# Patient Record
Sex: Female | Born: 1945 | Race: White | Hispanic: No | Marital: Married
Health system: Midwestern US, Academic
[De-identification: ages and names within clinical notes are randomized; demographics above are authoritative.]

## PROBLEM LIST (undated history)

## (undated) DIAGNOSIS — Z08 Encounter for follow-up examination after completed treatment for malignant neoplasm: ICD-10-CM

---

## 2016-06-23 MED ORDER — PEG-ELECTROLYTE SOLN 420 GRAM PO SOLR
4 L | Freq: Once | ORAL | 0 refills | Status: AC
Start: 2016-06-23 — End: ?

## 2016-07-07 MED ORDER — PEG-ELECTROLYTE SOLN 420 GRAM PO SOLR
4 L | Freq: Once | ORAL | 0 refills | Status: AC
Start: 2016-07-07 — End: ?

## 2016-08-14 ENCOUNTER — Encounter: Admit: 2016-08-14 | Discharge: 2016-08-14 | Payer: MEDICARE

## 2016-08-14 NOTE — Progress Notes
This encounter was created in error. Please disregard.

## 2016-08-16 ENCOUNTER — Encounter: Admit: 2016-08-16 | Discharge: 2016-08-16 | Payer: MEDICARE

## 2016-08-16 DIAGNOSIS — Z87891 Personal history of nicotine dependence: ICD-10-CM

## 2016-08-16 DIAGNOSIS — G35 Multiple sclerosis: ICD-10-CM

## 2016-08-16 DIAGNOSIS — Z9189 Other specified personal risk factors, not elsewhere classified: ICD-10-CM

## 2016-08-16 DIAGNOSIS — Z923 Personal history of irradiation: ICD-10-CM

## 2016-08-16 DIAGNOSIS — Z9221 Personal history of antineoplastic chemotherapy: ICD-10-CM

## 2016-08-16 DIAGNOSIS — Z780 Asymptomatic menopausal state: ICD-10-CM

## 2016-08-16 DIAGNOSIS — E785 Hyperlipidemia, unspecified: ICD-10-CM

## 2016-08-16 DIAGNOSIS — G629 Polyneuropathy, unspecified: ICD-10-CM

## 2016-08-16 DIAGNOSIS — K219 Gastro-esophageal reflux disease without esophagitis: ICD-10-CM

## 2016-08-16 DIAGNOSIS — I219 Acute myocardial infarction, unspecified: ICD-10-CM

## 2016-08-16 DIAGNOSIS — Z171 Estrogen receptor negative status [ER-]: ICD-10-CM

## 2016-08-16 DIAGNOSIS — I10 Essential (primary) hypertension: Principal | ICD-10-CM

## 2016-08-16 DIAGNOSIS — M549 Dorsalgia, unspecified: ICD-10-CM

## 2016-08-16 DIAGNOSIS — C801 Malignant (primary) neoplasm, unspecified: ICD-10-CM

## 2016-08-16 DIAGNOSIS — M1990 Unspecified osteoarthritis, unspecified site: ICD-10-CM

## 2016-08-16 DIAGNOSIS — Z9889 Other specified postprocedural states: ICD-10-CM

## 2016-08-16 DIAGNOSIS — Z08 Encounter for follow-up examination after completed treatment for malignant neoplasm: ICD-10-CM

## 2016-08-16 DIAGNOSIS — C50112 Malignant neoplasm of central portion of left female breast: Principal | ICD-10-CM

## 2016-08-16 NOTE — Progress Notes
LYMPHEDEMA DATASHEET-FOLLOW-UP  DATE:  08/16/16    PATIENT NAME:  Rachael Black  DATE OF BIRTH:  [birth year]  MRN:   [id number]    Follow-Up Visit:  8 months  Patient Completed Questionnaire?:  No  Lymphedema Diagnosis?:  No  LYMPHEDEMA INSTRUMENT:   PATIENT POPULATION:    Diagnosed with and treated for cancer at [facility]? Yes    Referred to [facility] after outside cancer treatment?  No    Referred to [facility] for lymphedema management?  No     DIAGNOSTIC DEFINITION OF LYMPHEDEMA:     Other:       IMPACT OF LE ON ADL:   CHANGES IN BMI (UPON LE DIAGNOSIS):  Stable    CHANGES IN ACTIVITY (CHECK ALL THAT APPLY):  Seed Localization and Segmental Mastectomy, SLNBX   RECENT INFECTIONS?:  No   TYPE OF INFECTION(S):  none   CHANGE IN PRESCRIBED MEDICATION?:  No   USE OF COMPRESSION SLEEVE:  No    FOLLOW-UP MEASUREMENTS:  Handedness:  right handed    Circumferential Measurements Perometer Measurements Bioimpedance Analysis   RUE/LUE:    Left (ml):  N/A 3.0 (-5.0)   Hand:  17.3/17.1   Right (ml):  N/A    Wrist:  15/15.1    Difference (ml):      8 cm:  19.5/17.8     16 cm:  22.2/21     Elbow cm:  22.5/22.5     8 cm:  23.6/24     16 cm:  26.7/27.2     24 cm:  31/31     BMI:28.2  Notes     Reviewed checklist with patient regarding any recent changes in health history, infections, medications, or general concerns as it relates to lymphedema.  Reinforced and verified adherence to precautions from initial visit.  No concerns or questions.  Discussed current activity level.  Has returned to previous activity level without concern.      Reviewed early signs and symptoms of lymphedema in affected arm.  None indicated.  Verified patient is aware of the early signs and symptoms to watch for as well as when to contact us.      Assessment:  L-dex: WNL    Measurements: no significant change (>2cm baseline)   ROM: WNL   Discussed assessment with patient.  No change in assessment to indicate presence of lymphedema. Lymphedema Stage:  Not applicable, no indication of lymphedema.     Recommendations:  Continue with precautions, meticulous skin care, weight management as well as active healthy lifestyle.  Verified no referrals indicated at this time for any issues or concerns.       Plan:  Return to clinic for routine visits based upon scheduled plan of [phone], then annual visits.  Verified patient has contact information should any questions or concerns arise.      Follow up appointment for lymphedema prevention clinic will be scheduled by patients breast surgeons team.

## 2016-08-16 NOTE — Progress Notes
Name: Ramira Ly          MRN: [id number]      DOB: 1945/05/19      AGE: 71 y.o.   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 Bures returns to the clinic today for continued surveillance. She is 1.5 years s/p left lumpectomy/SLNB following neoadjuvant chemotherapy for a triple negative, IDC. She denies any new findings on self-breast exam and she has no complaints today.       Cancer Staging  Malignant neoplasm of central portion of left female breast [facility])  Staging form: Breast, AJCC 7th Edition  - Clinical stage from 06/30/2015: Stage IA (T1c, N0, M0) - Signed by Judye Bos, PA-C on 06/30/2015  - Pathologic stage from 12/21/2015: yT0, N0, cM0 - Signed by Judye Bos, PA-C on 12/21/2015      History of Present Illness  HISTORY:  Ms. Yott is a Caucasian female who presented to the [facility] on 06/23/2015 at age 49 for evaluation of left breast lump. Ms. Coppes felt a lump in the middle of April 2017. She feels it is about the size of a quarter. She denies any nipple discharge, skin change or pain. She has not had imaging since 2015.  Ultrasound guided biopsy on 06/25/15 revealed grade 3 IDC.  Ms. Jarnagin completed neoadjuvant ddAC + Taxol on 11/30/15.  Ms. Linley underwent left RSL lumpectomy/SLNB on 12/14/2015.  Final pathology revealed a complete pathologic response with 0/4 lymph nodes.  She completed radiation therapy with Dr. Janee Morn on 02/09/16.      BREAST IMAGING:  Mammogram:    -- Bilateral screening mammogram 01/02/14 [facility]) revealed scattered fibroglandular densities. There was no suspicious mass or architectural distortion.  -- Bilateral diagnostic mammogram 06/23/15 ([place]) revealed scattered fibroglandular densities. There was a dense, irregular, 2 cm mass at the patient's area of palpable concern in the upper left breast, 12:00 position, anterior to middle depth. No new suspicious abnormality was seen within the right breast on mammogram.  Ultrasound:  -- Targeted left breast ultrasound 06/23/15 ([place]) revealed at 12:00, 5 cm from the nipple, demonstrated a 2.1 x 1.6 x 1.9 cm irregular, not parallel, hypoechoic mass with angular margins and internal blood flow. There may be intraductal extension from this mass towards the nipple. No suspicious left axillary or left parasternal lymph nodes were identified.  MRI:  -- Bilateral breast MRI 07/23/15 ([place]) revealed no area of suspicious enhancement in the right breast. No adenopathy, skin or nipple abnormalities were identified. Within the left breast superiorly at approximately 12:00 there was the known malignancy measuring 1.6 cm. 3 cm posterior and inferior to the mass within the middle depth central breast slightly medial, there was a 4 mm circumscribed enhancing mass which was stable on multiple prior mammograms dating back with June 2012. No skin or nipple abnormalities were present. No axillary abnormalities were present.    REPRODUCTIVE HEALTH:  Age at first Menarche:  38  Age at First Live Birth:  26  Age at Menopause:  37, took HRT x 2 years  Gravida:  2  Para: 2  Breastfeeding:  No    PROCEDURE:   1.  Right breast cyst excision, 1990s  2.  Left RSL lumpectomy/SLNB, 12/14/2015 [name])  PERTINENT PMH:  Multiple sclerosis, HTN, HLD, GERD   FAMILY HISTORY:  No family history of breast cancer  PHYSICAL EXAM on PRESENTATION:  Left - 2.5 cm lump at 12:00, 5 cm FTN. No skin or nipple changes. Right - No palpable breast masses. No skin, nipple, or areolar change. No supraclavicular or axillary adenopathy.    MEDICAL ONCOLOGY: Dr. Stasia Cavalier PRESENT THERAPY:  Neoadjuvant ddAC + Taxol 07/20/15 - 11/23/15.  REFERRED BY:  Dr. Merian Capron    TREATMENT HISTORY:   Dose dense AC 522/17 - 08/31/15  Weekly Taxol 09/14/15          Review of Systems Constitutional: Negative for fever, chills, appetite change and fatigue.   HENT: Negative for hearing loss, congestion, rhinorrhea and tinnitus.    Eyes: Negative for pain, discharge and itching.   Respiratory: Negative for cough, chest tightness and shortness of breath.    Cardiovascular: Negative for chest pain and palpitations.   Gastrointestinal: Negative for abdominal distention, pain, nausea, vomiting, and diarrhea.   Genitourinary: Negative for frequency, vaginal bleeding, difficulty urinating and pelvic pain.   Musculoskeletal: Negative for myalgias, back pain, joint swelling and arthralgias.   Skin: Negative for rash.   Neurological: Negative for dizziness, weakness, light-headedness and headaches.   Hematological: Does not bruise/bleed easily.   Psychiatric/Behavioral: Negative for disturbed wake/sleep cycle. The patient is not nervous/anxious.    Allergies   Allergen Reactions   ??? Sulfa (Sulfonamide Antibiotics) HIVES and SHORTNESS OF BREATH     The following medical/surgical/family/social history and the list of medications are current, as of 08/16/2016    Past Medical History:   Diagnosis Date   ??? Arthritis    ??? Back pain    ??? Cancer (HCC) 06/2015    Breast    ??? GERD (gastroesophageal reflux disease)    ??? History of chemotherapy     neoadjuvant chemotherapy 2017   ??? HLD (hyperlipidemia)    ??? Hypertension    ??? Multiple sclerosis (HCC) 1990   ??? Neuropathy      Past Surgical History:   Procedure Laterality Date   ??? HX RHINOPLASTY  1974   ??? HX TUBAL LIGATION  1976   ??? ANKLE SURGERY Right 2000    with instrumentation   ??? PR MASTECTOMY PARTIAL Left 12/14/2015    Left radioactive seed localized lumpectomy, left axillary sentinel lymph node biopsy  performed by Cordelia Poche, MD at [facility] OR/PERIOP   ??? COLONOSCOPY       Family History   Problem Relation Age of Onset   ??? Arthritis-osteo Mother    ??? Thyroid Disease Mother    ??? Heart Disease Father    ??? High Cholesterol Father    ??? Cancer Maternal Aunt ??? Cancer Maternal Uncle    ??? Cancer Paternal Uncle    ??? Cancer-Colon Maternal Grandmother    ??? Stroke Maternal Grandmother    ??? Cancer-Colon Paternal Grandmother      Social History     Social History   ??? Marital status: Married     Spouse name: N/A   ??? Number of children: N/A   ??? Years of education: N/A     Social History Main Topics   ??? Smoking status: Former Smoker     Packs/day: 1.00     Years: 10.00     Types: Cigarettes     Quit date: 02/28/2005   ??? Smokeless tobacco: Never Used   ??? Alcohol use 4.8 oz/week     8 Standard drinks  or equivalent per week   ??? Drug use: No   ??? Sexual activity: Not on file     Other Topics Concern   ??? Not on file     Social History Narrative   ??? No narrative on file     Objective:         ??? ALPHA LIPOIC ACID 100 mg cap Take 1 capsule by mouth daily.   ??? cholecalciferol (VITAMIN D-3) 1,000 units tablet Take 2,000 Units by mouth daily.   ??? gabapentin (NEURONTIN) 300 mg capsule Take 2 caps three times a day.  Indications: NEUROPATHIC PAIN   ??? loratadine (CLARITIN) 10 mg tablet Take 1 Tab by mouth every morning.   ??? pantoprazole DR (PROTONIX) 20 mg tablet Take 20 mg by mouth daily.   ??? pyridoxine (vitamin B6) (VITAMIN B-6) 100 mg tablet Take 1 tablet by mouth daily.     Vitals:    08/16/16 1126   BP: 117/86   Pulse: 74   Resp: 16   Temp: 36.9 ???C (98.5 ???F)   TempSrc: Oral   SpO2: 100%   Weight: 75 kg (165 lb 6.4 oz)   Height: 163 cm (64.17)     Body mass index is 28.24 kg/m???.     Pain Score: Zero         Pain Addressed:  N/A    Patient Evaluated for a Clinical Trial: No treatment clinical trial available for this patient.     [place] Cooperative Oncology Group performance status is 0, Fully active, able to carry on all pre-disease performance without restriction.Marland Kitchen     Physical Exam   Pulmonary/Chest:       Vitals reviewed.       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  HEENT:  Head: Normocephalic and atraumatic.  Eyes: No discharge. No scleral icterus.  Pulmonary/Chest: No respiratory distress.   Neurological: Alert and oriented to person, place and time. No cranial nerve deficit.  Skin: Warm and dry. No rash noted. No erythema. No pallor.  Psychiatric: Normal mood and affect. Behavior is normal. Judgement and thought content normal.         Assessment and Plan:  71 y/o female 1.5 years s/p left lumpectomy/SLNB following neoadjuvant chemotherapy for a grade 3, triple negative, IDC (ER 0%, PR 0%, HER2 1+, Ki-67 68%) at 12:00, dx 05/2015. ypCR.  ???  Ms. Caridi continues to do well.  There is no clinical evidence of recurrence. There have been no changes to her past medical or family history since her last visit. She continues to have a palpable seroma in the left axilla which she states is not bothersome to her and would prefer not ot have any additional aspirations of the area. She was seen in the lymphedema clinic today and will follow-up in the lymphedema clinic in 4 months. Ms. Grimley will continue to follow with Dr.  Satelli and Dr. Janee Morn. She is scheduled for bilateral diagnostic mammograms today; will call with results. Ms. Robenson was given ample time to have all of her questions answered and she was seen in conjunction with Dr. Veatrice Kells today.  ???  1.  Continue follow-up with Dr. Stasia Cavalier  2. Continue follow-up with Dr. Janee Morn  3. Bilateral diagnostic mammograms are scheduled for today; will call with results 4. RTC in 4 months to see APP; will coordinate follow-up in lymphedema clinic at that time.    Carylon Perches, APRN

## 2016-09-26 ENCOUNTER — Encounter: Admit: 2016-09-26 | Discharge: 2016-09-27 | Payer: MEDICARE

## 2016-09-28 ENCOUNTER — Ambulatory Visit: Admit: 2016-09-28 | Discharge: 2016-10-12 | Payer: MEDICARE

## 2016-10-03 ENCOUNTER — Encounter: Admit: 2016-10-03 | Discharge: 2016-10-03 | Payer: MEDICARE

## 2016-10-03 ENCOUNTER — Ambulatory Visit: Admit: 2016-10-03 | Discharge: 2016-10-03 | Payer: MEDICARE

## 2016-10-03 DIAGNOSIS — G35 Multiple sclerosis: ICD-10-CM

## 2016-10-03 DIAGNOSIS — K219 Gastro-esophageal reflux disease without esophagitis: ICD-10-CM

## 2016-10-03 DIAGNOSIS — G629 Polyneuropathy, unspecified: ICD-10-CM

## 2016-10-03 DIAGNOSIS — Z853 Personal history of malignant neoplasm of breast: Secondary | ICD-10-CM

## 2016-10-03 DIAGNOSIS — Z9221 Personal history of antineoplastic chemotherapy: ICD-10-CM

## 2016-10-03 DIAGNOSIS — I10 Essential (primary) hypertension: Principal | ICD-10-CM

## 2016-10-03 DIAGNOSIS — M1990 Unspecified osteoarthritis, unspecified site: ICD-10-CM

## 2016-10-03 DIAGNOSIS — M549 Dorsalgia, unspecified: ICD-10-CM

## 2016-10-03 DIAGNOSIS — C801 Malignant (primary) neoplasm, unspecified: ICD-10-CM

## 2016-10-03 DIAGNOSIS — E785 Hyperlipidemia, unspecified: ICD-10-CM

## 2016-10-03 NOTE — Progress Notes
FOLLOW- UP NOTE        Date:  10/03/2016    Rachael Black     71 y.o. female.     DOB: 09/24/1945                  MRN#:  [id number]       CHIEF COMPLAINT:       cT1-2???Nx???M0???, ypT0 N0(i-) M0, Invasive Ductal Carcinoma of the???Left Breast.  ??? Size:  ??? 2.1 cm on [place]  ??? 1.6 cm on MRI  ??? 0.8 cm on Core Biopsy.  ??? No Residual Post Tumor Chemo (2 cm Tumor Bed).  ??? Grade 3.   ??? No LVSI.  ??? No Associated DCIS.  ??? No Lympadenopathy on [place] / MRI.  ??? 0 of 4 Nodes Involved.  ??? No Evidence of Treatment Response Within the Nodes Pathologically.  ??? ER-???(0%) / PR-???(0%)  ??? Her-2/neu Negative (1+).  ??? Triple Negative.  ??? Ki-67 index 68%.  ??????    07/23/15 ???Breast MRI, Axial View: 1.6 cm Left Breast Tumor.  ???  PREVIOUS PROCEDURES / TREATMENTS:  08/31/15 ???Completed DDAC x 4 (Satelli).  11/23/15 ???Completed Taxol x 11 (Satelli).  12/14/15 ???Left Radioactive Seed Localized Lumpectomy / Sentinel Node Biopsy ([place]).  02/09/16  Completed Left Breast Radiation to 5205 cGy.    SUBJECTIVE:  No breast masses, no nipple discharge, no arm swelling, no bone pain, no headaches, no complaints.  No seizures, no ataxia.  No N/V.  Larey Seat as she tripped earlier this week.  No neurologic symptoms.             Review of Systems   Constitutional: Negative.    HENT: Negative.    Eyes: Negative.    Respiratory: Negative.    Cardiovascular: Negative.    Gastrointestinal: Negative.    Endocrine: Negative.    Genitourinary: Negative.    Musculoskeletal: Negative.    Skin: Negative.    Allergic/Immunologic: Negative.    Neurological: Negative.    Hematological: Negative.    Psychiatric/Behavioral: Negative.          OBJECTIVE:            Vitals:    10/03/16 1122   BP: 121/73   Pulse: 80   Resp: 18   Temp: 36.8 ???C (98.3 ???F)   TempSrc: Oral   SpO2: 100%     There is no height or weight on file to calculate BMI.     Pain Score: Four  Pain Loc: Hand      Physical Exam   Constitutional: She is oriented to person, place, and time.   HENT: Head: Normocephalic and atraumatic.   Right Ear: Hearing normal.   Left Ear: Hearing normal.   Nose: Nose normal.   Mouth/Throat: Uvula is midline, oropharynx is clear and moist and mucous membranes are normal.   Eyes: Conjunctivae, EOM and lids are normal. Pupils are equal, round, and reactive to light. Lids are everted and swept, no foreign bodies found.   Neck: Trachea normal, normal range of motion, full passive range of motion without pain and phonation normal. Neck supple. No JVD present. No neck rigidity. No edema and no erythema present. No thyroid mass and no thyromegaly present.   Cardiovascular: Normal rate, regular rhythm, S1 normal, S2 normal, normal heart sounds and normal pulses.    Pulmonary/Chest: Effort normal and breath sounds normal. No respiratory distress.   Abdominal: Soft. Normal appearance, normal aorta and bowel sounds are normal. There is  no hepatosplenomegaly. There is no tenderness. No hernia. Hernia confirmed negative in the ventral area.   Musculoskeletal: Normal range of motion.   Lymphadenopathy:        Head (right side): No submental, no submandibular, no tonsillar, no preauricular, no posterior auricular and no occipital adenopathy present.        Head (left side): No submental, no submandibular, no tonsillar, no preauricular, no posterior auricular and no occipital adenopathy present.     She has no cervical adenopathy.        Right cervical: No superficial cervical, no deep cervical and no posterior cervical adenopathy present.       Left cervical: No superficial cervical, no deep cervical and no posterior cervical adenopathy present.     She has no axillary adenopathy.        Right: No supraclavicular adenopathy present.        Left: No supraclavicular adenopathy present.   Neurological: She is alert and oriented to person, place, and time. She has normal strength. No cranial nerve deficit. She displays a negative Romberg sign. She displays no Babinski's sign on the right side. She displays no Babinski's sign on the left side.   Reflex Scores:       Patellar reflexes are 2+ on the right side and 2+ on the left side.  Skin: Skin is warm and intact. No abrasion, no bruising, no burn, no ecchymosis, no laceration, no lesion, no petechiae and no rash noted. No cyanosis or erythema. Nails show no clubbing.   Psychiatric: She has a normal mood and affect. Her speech is normal and behavior is normal. Judgment and thought content normal. Cognition and memory are normal.               RADIOLOGY:        08/16/16  Bilateral Mammograms ([place]):  ??? No evidence of malignancy.  ??? BI-RADS II.    ASSESSMENT:   ??? History of Breast Cancer.  ??? No evidence of malignancy.    PLAN:      ??? F/U with Dr. Stasia Cavalier next week.

## 2016-10-07 ENCOUNTER — Encounter: Admit: 2016-10-07 | Discharge: 2016-10-07 | Payer: MEDICARE

## 2016-10-07 DIAGNOSIS — M549 Dorsalgia, unspecified: ICD-10-CM

## 2016-10-07 DIAGNOSIS — K219 Gastro-esophageal reflux disease without esophagitis: ICD-10-CM

## 2016-10-07 DIAGNOSIS — G629 Polyneuropathy, unspecified: ICD-10-CM

## 2016-10-07 DIAGNOSIS — Z171 Estrogen receptor negative status [ER-]: ICD-10-CM

## 2016-10-07 DIAGNOSIS — M1990 Unspecified osteoarthritis, unspecified site: ICD-10-CM

## 2016-10-07 DIAGNOSIS — D72819 Decreased white blood cell count, unspecified: ICD-10-CM

## 2016-10-07 DIAGNOSIS — C50112 Malignant neoplasm of central portion of left female breast: Principal | ICD-10-CM

## 2016-10-07 DIAGNOSIS — G35 Multiple sclerosis: ICD-10-CM

## 2016-10-07 DIAGNOSIS — Z923 Personal history of irradiation: ICD-10-CM

## 2016-10-07 DIAGNOSIS — Z9221 Personal history of antineoplastic chemotherapy: ICD-10-CM

## 2016-10-07 DIAGNOSIS — E785 Hyperlipidemia, unspecified: ICD-10-CM

## 2016-10-07 DIAGNOSIS — I10 Essential (primary) hypertension: Principal | ICD-10-CM

## 2016-10-07 DIAGNOSIS — C801 Malignant (primary) neoplasm, unspecified: ICD-10-CM

## 2016-10-07 LAB — COMPREHENSIVE METABOLIC PANEL
Lab: 0.5 mg/dL (ref 0.3–1.2)
Lab: 0.9 mg/dL (ref 0.4–1.00)
Lab: 10 mg/dL (ref 8.5–10.6)
Lab: 102 MMOL/L (ref 98–110)
Lab: 12 U/L (ref 7–40)
Lab: 134 MMOL/L — ABNORMAL LOW (ref 137–147)
Lab: 23 mg/dL (ref 7–25)
Lab: 28 MMOL/L (ref 21–30)
Lab: 3.9 g/dL (ref 3.5–5.0)
Lab: 4 10*3/uL (ref 3–12)
Lab: 5.3 MMOL/L — ABNORMAL HIGH (ref 3.5–5.1)
Lab: 56 mL/min — ABNORMAL LOW (ref >60)
Lab: 6.8 g/dL — ABNORMAL LOW (ref 6.0–8.0)
Lab: 7 U/L (ref 7–56)
Lab: 89 U/L — ABNORMAL HIGH (ref 25–110)
Lab: 96 mg/dL (ref 70–100)
Lab: >60 mL/min (ref >60)

## 2016-10-07 LAB — CBC AND DIFF
Lab: 0 10*3/uL (ref 0–0.20)
Lab: 3.9 M/UL — ABNORMAL LOW (ref 4.0–5.0)
Lab: 5.4 10*3/uL (ref 4.5–11.0)

## 2016-10-07 NOTE — Progress Notes
Date of Service: 10/07/2016      Subjective:             Reason for Visit:  Heme/Onc Care      Rachael Black is a 71 y.o. female.      History of Present Illness  PRIMARY PROVIDER: Dr. Merian Capron  SURGEON: Dr. Cordelia Poche    DIAGNOSIS: Left breast IDC, grade 3, ER/PR and HER2 negative, Ki67 - 68%  STAGE: cT2N0, stage II  YpT0N0, pathological CR  ONCOLOGICAL HISTORY:    71 year old Caucasian female with new diagnosis of left breast cancer.  She had her regular screening mammogram in November 2015. She palpated a mass in the left breast that led to diagnostic imaging march 2017. 2 cm mass was found on imaging studies at 12:00 position for which she underwent stereotactic biopsy.  Pathology was consistent with an invasive ductal carcinoma, triple negative, grade 3 with Ki-67 of 68 percent.  Denies pain, nipple discharge, skin changes. She had excisional biopsy on right outer lower quadrant for a cyst long time back.   Patient could not participate in clinical trial due to underlying history of multiple sclerosis.  She received neoadjuvant chemotherapy with dose dense AC and weekly Taxol.  Could not receive 12 cycles of  Taxol due to neuropathy.  She underwent lumpectomy and sentinel lymph node biopsy with Dr. [name] 12/14/2015, pathology showed pathological CR.  Lymph nodes were not involved.   Due to palpable mass/cyst, mammogram and [place] were done Feb 2018, cyst was found and aspirated    TREATMENT HISTORY:   Dose dense AC 07/20/15 - 08/31/15  Weekly Taxol 09/14/15 - 11/23/15.  12th cycle not given due to neuropathy  Completed radiation 02/09/2016    INTERVAL HISTORY: Patient is here for follow-up.  Accompanied by her husband.  Neuropathy improved, not back to baseline.    Larey Seat getting out of bed, fractured left leg and left forearm, in cast         Review of Systems   Constitutional: Negative for activity change, appetite change, chills, diaphoresis, fatigue and fever.   HENT: Negative.    Eyes: Negative. Respiratory: Negative for cough and shortness of breath.    Cardiovascular: Negative for chest pain and palpitations.   Gastrointestinal: Negative for abdominal pain, blood in stool, constipation, diarrhea, nausea and vomiting.   Genitourinary: Negative.    Musculoskeletal: Positive for arthralgias and gait problem (due to fracture, non weight bearing).   Skin: Negative for rash.   Neurological: Positive for numbness. Negative for dizziness, weakness, light-headedness and headaches.   Hematological: Negative.    Psychiatric/Behavioral: Negative.        PAST MEDICAL HISTORY: Multiple sclerosis, GERD  FAMILY HISTORY: Reviewed and non contributory. No history of breast, ovarian, endometrial cancer  OB/GYN HISTORY:  Age at first Menarche:??? 17  Age at Menopause:??? 60, took HRT x 2 years  Age at First Live Birth:??? 61  Gravida:??? 2  Para: 2  Breastfeeding:??? No  SOCIAL HISTORY: Non smoker. No alcohol or drug use    Objective:         ??? ALPHA LIPOIC ACID 100 mg cap Take 1 capsule by mouth daily.   ??? cholecalciferol (VITAMIN D-3) 1,000 units tablet Take 2,000 Units by mouth daily.   ??? gabapentin (NEURONTIN) 300 mg capsule Take 2 caps three times a day.  Indications: NEUROPATHIC PAIN   ??? loratadine (CLARITIN) 10 mg tablet Take 1 Tab by mouth every morning.   ??? pantoprazole DR (  PROTONIX) 20 mg tablet Take 20 mg by mouth daily.   ??? pyridoxine (vitamin B6) (VITAMIN B-6) 100 mg tablet Take 1 tablet by mouth daily.     Vitals:    10/07/16 1030 10/07/16 1031   BP: 108/72    Pulse: 74    Resp: 18    Temp: 36.2 ???C (97.2 ???F)    TempSrc: Oral Oral   SpO2: 98%    Height: 162.8 cm (64.1)      There is no height or weight on file to calculate BMI.     Pain Score: Zero         Pain Addressed:  N/A    Patient Evaluated for a Clinical Trial: Patient currently in screening for a treatment clinical trial.     [place] Cooperative Oncology Group performance status is 0, Fully active, able to carry on all pre-disease performance without restriction.Marland Kitchen     Physical Exam   Constitutional: She is oriented to person, place, and time. She appears well-developed and well-nourished. No distress.   HENT:   Mouth/Throat: No oropharyngeal exudate.   Eyes: Conjunctivae and EOM are normal.   Left eye ptosis    Neck: Neck supple.   Cardiovascular: Regular rhythm and normal heart sounds.    No murmur heard.       Pulmonary/Chest: Effort normal and breath sounds normal. She has no wheezes.       Abdominal: She exhibits no mass. There is no tenderness.   Musculoskeletal: Normal range of motion. She exhibits no edema.   Lymphadenopathy:     She has no cervical adenopathy.   Neurological: She is alert and oriented to person, place, and time.   Skin: No rash noted.   Psychiatric: She has a normal mood and affect. Her behavior is normal. Judgment and thought content normal.             Assessment and Plan:  1. Left breast IDC, triple negative. 2.5 cm on clinical exam, cT2cN0 stage IIA disease.  MRI showed 1.6 cm area, likely T1c disease.  2. Completed neoadjuvant chemotherapy with dd AC and weekly taxol.  Pathological CR seen at the time of surgery.  3. Completed radiation with Dr. Janee Morn 02/09/16.  4.  She also has some lymphedema changes of the breast, continue massage, she is scheduled to follow-up with lymphedema clinic in 1 week.    5.  Neuropathy improved, not back to baseline  On gabapentin, can taper off once symptoms improve.  6.  Macrocytosis without anemia.  Resolved.  Folate and B12 have been normal.    7.  Multiple sclerosis, long-standing history.  Follows with Dr. Aileen Pilot.    Return to clinic 3 mos or sooner as needed.

## 2016-10-10 ENCOUNTER — Encounter: Admit: 2016-10-10 | Discharge: 2016-10-10 | Payer: MEDICARE

## 2016-10-10 DIAGNOSIS — E875 Hyperkalemia: Principal | ICD-10-CM

## 2016-10-10 NOTE — Telephone Encounter
Called and discussed potassium level with pt. Pt denies taking potassium supplements and does not believe that she is eating anything high in potassium, as we discussed. Pt states that she will google a list of high potassium containing foods and cut back on them if she is eating them. Will get lab draw in 2 weeks--pt agreeable and states understanding. Encouraged pt to call back with any further concern/questions.

## 2016-10-10 NOTE — Telephone Encounter
-----   Message from Mollie Germany, MD sent at 10/07/2016 11:34 PM CDT -----  Have her please check BMP in 2 weeks. Avoid taking extra K by diet or any K pills

## 2016-10-12 DIAGNOSIS — Z853 Personal history of malignant neoplasm of breast: ICD-10-CM

## 2016-10-12 DIAGNOSIS — Z171 Estrogen receptor negative status [ER-]: ICD-10-CM

## 2016-10-12 DIAGNOSIS — Z08 Encounter for follow-up examination after completed treatment for malignant neoplasm: Principal | ICD-10-CM

## 2016-10-26 ENCOUNTER — Encounter: Admit: 2016-10-26 | Discharge: 2016-10-26 | Payer: MEDICARE

## 2016-10-26 DIAGNOSIS — E875 Hyperkalemia: Principal | ICD-10-CM

## 2016-10-26 LAB — BASIC METABOLIC PANEL
Lab: 0.9 mg/dL (ref 0.4–1.00)
Lab: 10 mg/dL (ref 8.5–10.6)
Lab: 104 MMOL/L (ref 98–110)
Lab: 138 MMOL/L (ref 137–147)
Lab: 18 mg/dL (ref 7–25)
Lab: 28 MMOL/L (ref 21–30)
Lab: 4.1 MMOL/L (ref 3.5–5.1)
Lab: 85 mg/dL (ref 70–100)
Lab: >60 mL/min (ref >60)
Lab: >60 mL/min (ref >60)
Lab: 6 (ref 3–12)

## 2016-10-27 ENCOUNTER — Encounter: Admit: 2016-10-27 | Discharge: 2016-10-27 | Payer: MEDICARE

## 2016-11-02 ENCOUNTER — Encounter: Admit: 2016-11-02 | Discharge: 2016-11-03 | Payer: MEDICARE

## 2016-12-15 ENCOUNTER — Ambulatory Visit: Admit: 2016-12-15 | Discharge: 2016-12-16 | Payer: MEDICARE

## 2016-12-15 ENCOUNTER — Encounter: Admit: 2016-12-15 | Discharge: 2016-12-15 | Payer: MEDICARE

## 2016-12-15 DIAGNOSIS — G629 Polyneuropathy, unspecified: ICD-10-CM

## 2016-12-15 DIAGNOSIS — K219 Gastro-esophageal reflux disease without esophagitis: ICD-10-CM

## 2016-12-15 DIAGNOSIS — S92902A Unspecified fracture of left foot, initial encounter for closed fracture: ICD-10-CM

## 2016-12-15 DIAGNOSIS — Z8711 Personal history of peptic ulcer disease: ICD-10-CM

## 2016-12-15 DIAGNOSIS — Z9221 Personal history of antineoplastic chemotherapy: ICD-10-CM

## 2016-12-15 DIAGNOSIS — G35 Multiple sclerosis: ICD-10-CM

## 2016-12-15 DIAGNOSIS — M1990 Unspecified osteoarthritis, unspecified site: ICD-10-CM

## 2016-12-15 DIAGNOSIS — M549 Dorsalgia, unspecified: ICD-10-CM

## 2016-12-15 DIAGNOSIS — R195 Other fecal abnormalities: Principal | ICD-10-CM

## 2016-12-15 DIAGNOSIS — D126 Benign neoplasm of colon, unspecified: ICD-10-CM

## 2016-12-15 DIAGNOSIS — S62109A Fracture of unspecified carpal bone, unspecified wrist, initial encounter for closed fracture: ICD-10-CM

## 2016-12-15 DIAGNOSIS — C801 Malignant (primary) neoplasm, unspecified: ICD-10-CM

## 2016-12-15 DIAGNOSIS — I10 Essential (primary) hypertension: Principal | ICD-10-CM

## 2016-12-15 DIAGNOSIS — E785 Hyperlipidemia, unspecified: ICD-10-CM

## 2016-12-15 DIAGNOSIS — K274 Chronic or unspecified peptic ulcer, site unspecified, with hemorrhage: ICD-10-CM

## 2016-12-15 NOTE — Progress Notes
Date of Service: 12/15/2016    Subjective:             Rachael Black is a 71 y.o. female.    History of Present Illness    Tiea is here for follow-up.  She is a very pleasant 70 years old lady with a history of breast cancer who was last seen in GI clinic in April 2018.  She presented for evaluation of Hemoccult positive stool and also history of anemia secondary to GI bleeding attributed to gastric ulcer associated with H. pylori.    Additional evaluation at [facility]:     Upper endoscopy Jul 14, 2016:    Significant for large hiatal hernia.  Gastric erythema.    Biopsies from the duodenum were normal.  Gastric biopsies showed no bacteria H. pylori.  There was mild chronic inflammation with intestinal CBC done in August 2018 showed hemoglobin 12.9.  Metaplasia.    Colonoscopy Jul 14, 2016:    Single diminutive 3 mm polyp was removed from the rectum.  Biopsy was consistent with tubular adenoma.  Colonoscopy was recommended to be repeated in 5 years.  On today's visit the patient reports    That she has been doing well.  She has been taking Protonix once a day and has no breakthrough episodes of acid reflux.  She never tried to stop Protonix.  She reports that in the past she had some cough and she feels that maybe Protonix has been Helpful for cough also.  She denies any diarrhea or constipation.  She is not taking any nonsteroidal anti-inflammatory drugs.  She takes Tylenol as needed.  She denies any melena hematochezia nausea or vomiting.         Review of Systems   Gastrointestinal: Positive for constipation.   Musculoskeletal: Positive for arthralgias and back pain.   All other systems reviewed and are negative.        Objective:         ??? acetaminophen (TYLENOL ARTHRITIS PO) Take 3 tablets by mouth twice daily.   ??? ALPHA LIPOIC ACID 100 mg cap Take 1 capsule by mouth daily.   ??? cholecalciferol (VITAMIN D-3) 1,000 units tablet Take 2,000 Units by mouth daily. ??? gabapentin (NEURONTIN) 300 mg capsule Take 2 caps three times a day.  Indications: NEUROPATHIC PAIN   ??? loratadine (CLARITIN) 10 mg tablet Take 1 Tab by mouth every morning.   ??? pantoprazole DR (PROTONIX) 20 mg tablet Take 20 mg by mouth daily.   ??? pyridoxine (vitamin B6) (VITAMIN B-6) 100 mg tablet Take 1 tablet by mouth daily.     Vitals:    12/15/16 1003   BP: 116/75   Pulse: 81   Resp: 16   Temp: (!) 34.8 ???C (94.6 ???F)   TempSrc: Oral   Weight: 78.2 kg (172 lb 6.4 oz)   Height: 165.1 cm (65)     Body mass index is 28.69 kg/m???.     Physical Exam   Constitutional: She is oriented to person, place, and time. She appears well-developed and well-nourished.   HENT:   Head: Normocephalic and atraumatic.   Eyes: Pupils are equal, round, and reactive to light. EOM are normal.   Neck: Normal range of motion. Neck supple.   Cardiovascular: Normal rate and regular rhythm.    Pulmonary/Chest: Effort normal and breath sounds normal.   Abdominal: Soft. Bowel sounds are normal. She exhibits no distension. There is no tenderness.   Musculoskeletal: Normal range of  motion.   Neurological: She is alert and oriented to person, place, and time.   Skin: Skin is warm and dry.   Psychiatric: She has a normal mood and affect.            Assessment and Plan:    1. Patient with hemoccult positive stool.  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 pylori 3-1/2 years ago treated at [facility].  Colonoscopy was normal at that time but that colonoscopy report is not available for me to review.  There is a family history of colon cancer in one second-degree relative.    Patient currently does not have any overt GI bleeding or any GI symptoms.  She denies nonsteroidal anti-inflammatory drug use.    Additional evlaluation at [place] :   Upper endoscopy was negative for mucosal disease of upper GI tract.  Biopsies were negative for H. pylori. Small colonic tubular adenoma was removed from the rectum on the recent colonoscopy.    2. GERD - doing well on protonix 40 mg po qd.   Patient reports improvement in cough with Protonix intake.  It is possible that she had some extra esophageal manifestations related to GERD such as cough.     3. Breast cancer, patient finished treatment including surgery, chemotherapy and radiation.  ???  4. History of arthritis.  The patient is not consuming nonsteroidal anti-inflammatory drugs.  She is taking Tylenol as needed.    Recommendations:   ???  I recommend the patient to continue taking Protonix 40mg  po qd, she can decrease intake to be taken every other day or even as needed.    Repeat colonoscopy in 5 years.    Follow-up in GI clinic as needed.            I recommend to continue avoiding nonsteroidal anti-inflammatory drugs and use Tylenol for arthritis as needed.  ???  Continue PPI, Protonix 40 mg once a day.  ???  Obtain CBC and Chem-12 today to evaluate the patient for anemia.  ???  EGD for evaluation of upper GI tract for source of GI bleeding and colonoscopy for evaluation of lower GI tract for potential source of GI bleeding.  Both procedures to be done at the same time.  ???  Patient may need additional evaluation of small bowel if upper and lower endoscopies are normal.  In that case she may need to have small bowel capsule endoscopy.  ???  We will reevaluate the patient in GI clinic in 6 months

## 2016-12-27 ENCOUNTER — Encounter: Admit: 2016-12-27 | Discharge: 2016-12-27 | Payer: MEDICARE

## 2016-12-27 DIAGNOSIS — Z923 Personal history of irradiation: ICD-10-CM

## 2016-12-27 DIAGNOSIS — Z171 Estrogen receptor negative status [ER-]: ICD-10-CM

## 2016-12-27 DIAGNOSIS — S62109A Fracture of unspecified carpal bone, unspecified wrist, initial encounter for closed fracture: ICD-10-CM

## 2016-12-27 DIAGNOSIS — M549 Dorsalgia, unspecified: ICD-10-CM

## 2016-12-27 DIAGNOSIS — C50812 Malignant neoplasm of overlapping sites of left female breast: Principal | ICD-10-CM

## 2016-12-27 DIAGNOSIS — M1990 Unspecified osteoarthritis, unspecified site: ICD-10-CM

## 2016-12-27 DIAGNOSIS — C801 Malignant (primary) neoplasm, unspecified: ICD-10-CM

## 2016-12-27 DIAGNOSIS — Z9189 Other specified personal risk factors, not elsewhere classified: ICD-10-CM

## 2016-12-27 DIAGNOSIS — K219 Gastro-esophageal reflux disease without esophagitis: ICD-10-CM

## 2016-12-27 DIAGNOSIS — G629 Polyneuropathy, unspecified: ICD-10-CM

## 2016-12-27 DIAGNOSIS — Z9221 Personal history of antineoplastic chemotherapy: ICD-10-CM

## 2016-12-27 DIAGNOSIS — G35 Multiple sclerosis: ICD-10-CM

## 2016-12-27 DIAGNOSIS — Z87891 Personal history of nicotine dependence: ICD-10-CM

## 2016-12-27 DIAGNOSIS — I10 Essential (primary) hypertension: Principal | ICD-10-CM

## 2016-12-27 DIAGNOSIS — E785 Hyperlipidemia, unspecified: ICD-10-CM

## 2016-12-27 DIAGNOSIS — S92902A Unspecified fracture of left foot, initial encounter for closed fracture: ICD-10-CM

## 2016-12-27 NOTE — Progress Notes
Name: Rachael Black          MRN: [id number]      DOB: 1945/10/30      AGE: 71 y.o.   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 She is 1 year s/p left lumpectomy/SLNB following neoadjuvant chemotherapy for a grade 3, triple negative, IDC. She recently fell out of bed and fracture her wrist and ankle, but otherwise, she reports no changes in her medical history. Rachael Black denies any new findings on self-breast exam and she has no other complaints today.     Cancer Staging  Malignant neoplasm of central portion of left female breast [facility])  Staging form: Breast, AJCC 7th Edition  - Clinical stage from 06/30/2015: Stage IA (T1c, N0, M0) - Signed by Judye Bos, PA-C on 06/30/2015  - Pathologic stage from 12/21/2015: yT0, N0, cM0 - Signed by Judye Bos, PA-C on 12/21/2015      History of Present Illness  HISTORY:  Rachael Black is a Caucasian female who presented to the [facility] on 06/23/2015 at age 66 for evaluation of left breast lump. Rachael Black felt a lump in the middle of April 2017. She feels it is about the size of a quarter. She denies any nipple discharge, skin change or pain. She has not had imaging since 2015.  Ultrasound guided biopsy on 06/25/15 revealed grade 3 IDC.  Rachael Black completed neoadjuvant ddAC + Taxol on 11/30/15.  Rachael Black underwent left RSL lumpectomy/SLNB on 12/14/2015.  Final pathology revealed a complete pathologic response with 0/4 lymph nodes.  She completed radiation therapy with Dr. Janee Morn on 02/09/16.      BREAST IMAGING:  Mammogram:    -- Bilateral screening mammogram 01/02/14 [facility]) revealed scattered fibroglandular densities. There was no suspicious mass or architectural distortion.  -- Bilateral diagnostic mammogram 06/23/15 ([place]) revealed scattered fibroglandular densities. There was a dense, irregular, 2 cm mass at the patient's area of palpable concern in the upper left breast, 12:00 position, anterior to middle depth. No new suspicious abnormality was seen within the right breast on mammogram.  Ultrasound:  -- Targeted left breast ultrasound 06/23/15 ([place]) revealed at 12:00, 5 cm from the nipple, demonstrated a 2.1 x 1.6 x 1.9 cm irregular, not parallel, hypoechoic mass with angular margins and internal blood flow. There may be intraductal extension from this mass towards the nipple. No suspicious left axillary or left parasternal lymph nodes were identified.  MRI:  -- Bilateral breast MRI 07/23/15 () revealed no area of suspicious enhancement in the right breast. No adenopathy, skin or nipple abnormalities were identified. Within the left breast superiorly at approximately 12:00 there was the known malignancy measuring 1.6 cm. 3 cm posterior and inferior to the mass within the middle depth central breast slightly medial, there was a 4 mm circumscribed enhancing mass which was stable on multiple prior mammograms dating back with June 2012. No skin or nipple abnormalities were present. No axillary abnormalities were present.    REPRODUCTIVE HEALTH:  Age at first Menarche:  58  Age at First Live Birth:  22  Age at Menopause:  45, took HRT x 2 years  Gravida:  2  Para: 2  Breastfeeding:  No  PROCEDURE:   1.  Right breast cyst excision, 1990s  2.  Left RSL lumpectomy/SLNB, 12/14/2015 [name])  PERTINENT PMH: Multiple sclerosis, HTN, HLD, GERD   FAMILY HISTORY:  No family history of breast cancer  PHYSICAL EXAM on PRESENTATION:  Left - 2.5 cm lump at 12:00, 5 cm FTN. No skin or nipple changes. Right - No palpable breast masses. No skin, nipple, or areolar change. No supraclavicular or axillary adenopathy.    MEDICAL ONCOLOGY: Dr. Stasia Cavalier PRESENT THERAPY:  Neoadjuvant ddAC + Taxol 07/20/15 - 11/23/15.  REFERRED BY:  Dr. Merian Capron    TREATMENT HISTORY: Dose dense AC 522/17 - 08/31/15  Weekly Taxol 09/14/15          Review of Systems  Constitutional: Negative for fever, chills, appetite change and fatigue.   HENT: Negative for hearing loss, congestion, rhinorrhea and tinnitus.    Eyes: Negative for pain, discharge and itching.   Respiratory: Negative for cough, chest tightness and shortness of breath.    Cardiovascular: Negative for chest pain and palpitations.   Gastrointestinal: Negative for abdominal distention, pain, nausea, vomiting, and diarrhea.   Genitourinary: Negative for frequency, vaginal bleeding, difficulty urinating and pelvic pain.   Musculoskeletal: Negative for myalgias, back pain, joint swelling and arthralgias.   Skin: Negative for rash.   Neurological: Negative for dizziness, weakness, light-headedness and headaches.   Hematological: Does not bruise/bleed easily.   Psychiatric/Behavioral: Negative for disturbed wake/sleep cycle. The patient is not nervous/anxious.    Allergies   Allergen Reactions   ??? Sulfa (Sulfonamide Antibiotics) HIVES and SHORTNESS OF BREATH     The following medical/surgical/family/social history and the list of medications are current, as of 12/27/2016    Past Medical History:   Diagnosis Date   ??? Arthritis    ??? Back pain    ??? Cancer (HCC) 06/2015    Breast    ??? Foot fracture, left 09/26/2016   ??? GERD (gastroesophageal reflux disease)    ??? History of chemotherapy     neoadjuvant chemotherapy 2017   ??? HLD (hyperlipidemia)    ??? Hypertension    ??? Multiple sclerosis (HCC) 1990   ??? Neuropathy    ??? Wrist fracture 09/26/2016    heads of ulna and radius     Past Surgical History:   Procedure Laterality Date   ??? HX RHINOPLASTY  1974   ??? HX TUBAL LIGATION  1976   ??? ANKLE SURGERY Right 2000    with instrumentation   ??? PR MASTECTOMY PARTIAL Left 12/14/2015    Left radioactive seed localized lumpectomy, left axillary sentinel lymph node biopsy  performed by Cordelia Poche, MD at [facility] OR/PERIOP   ??? COLONOSCOPY       Family History Problem Relation Age of Onset   ??? Arthritis-osteo Mother    ??? Thyroid Disease Mother    ??? Heart Disease Father    ??? High Cholesterol Father    ??? Cancer Maternal Aunt    ??? Cancer Maternal Uncle    ??? Cancer Paternal Uncle    ??? Cancer-Colon Maternal Grandmother    ??? Stroke Maternal Grandmother    ??? Cancer-Colon Paternal Grandmother      Social History     Social History   ??? Marital status: Married     Spouse name: N/A   ??? Number of children: N/A   ??? Years of education: N/A     Social History Main Topics   ??? Smoking status: Former Smoker  Packs/day: 1.00     Years: 10.00     Types: Cigarettes     Quit date: 02/28/2005   ??? Smokeless tobacco: Never Used   ??? Alcohol use 4.8 oz/week     8 Standard drinks or equivalent per week   ??? Drug use: No   ??? Sexual activity: Not on file     Other Topics Concern   ??? Not on file     Social History Narrative   ??? No narrative on file     Objective:         ??? acetaminophen (TYLENOL ARTHRITIS PO) Take 3 tablets by mouth twice daily.   ??? ALPHA LIPOIC ACID 100 mg cap Take 1 capsule by mouth daily.   ??? cholecalciferol (VITAMIN D-3) 1,000 units tablet Take 2,000 Units by mouth daily.   ??? gabapentin (NEURONTIN) 300 mg capsule Take 2 caps three times a day.  Indications: NEUROPATHIC PAIN   ??? loratadine (CLARITIN) 10 mg tablet Take 1 Tab by mouth every morning.   ??? pantoprazole DR (PROTONIX) 20 mg tablet Take 20 mg by mouth daily.   ??? pyridoxine (vitamin B6) (VITAMIN B-6) 100 mg tablet Take 1 tablet by mouth daily.     Vitals:    12/27/16 1505   BP: 118/77   Pulse: 87   Resp: 16   Temp: 36.5 ???C (97.7 ???F)   TempSrc: Oral   SpO2: 100%   Weight: 79.4 kg (175 lb)   Height: 165.1 cm (65)     Body mass index is 29.12 kg/m???.     Pain Score: Zero         Pain Addressed:  N/A    Patient Evaluated for a Clinical Trial: No treatment clinical trial available for this patient.     [place] Cooperative Oncology Group performance status is 0, Fully active, able to carry on all pre-disease performance without restriction.Marland Kitchen     Physical Exam   Pulmonary/Chest:       Vitals reviewed.       Constitutional: No acute distress.  HEENT:  Head: Normocephalic and atraumatic.  Eyes: No discharge. No scleral icterus.  Pulmonary/Chest: No respiratory distress.   Lymphadenopathy: There is no axillary, infraclavicular, or supraclavicular adenopathy.  Neurological: Alert and oriented to person, place and time. No cranial nerve deficit.  Skin: Warm and dry. No rash noted. No erythema. No pallor.  Psychiatric: Normal mood and affect. Behavior is normal. Judgement and thought content normal.       Assessment and Plan:  71 y/o female 1 year s/p left lumpectomy/SLNB following neoadjuvant chemotherapy for a grade 3, triple negative, IDC (ER 0%, PR 0%, HER2 1+, Ki-67 68%) at 12:00, dx 05/2015. ypCR.  ???  Rachael Black continues to do well. ???There is no clinical evidence of recurrence. She recently fell out of bed and fracture her left arm and left ankle. When she saw the lymphedema nurse today, her left arm measurements had slightly increased in size.  Rachael Black will continue to follow with Dr. Stasia Cavalier and Dr. Janee Morn. Bilateral diagnostic mammograms in June 2018 were BIRADS 2.  Rachael Black was given ample time to have all of her questions answered.    PLAN:  1. ???Continue follow-up with Dr. Stasia Cavalier  2. Continue follow-up with Dr. Janee Morn  3. RTC in 4 months to see Dr. [name]; screening mammograms will be due in 8 months  4. Coordinate follow-up in lymphedema clinic in 4 months to re-measure LUE after injury  Carylon Perches, APRN

## 2016-12-27 NOTE — Progress Notes
LYMPHEDEMA DATASHEET-FOLLOW-UP    DATE:  12/27/16    PATIENT NAME:  Rachael Black  DATE OF BIRTH:  1946-02-05  MRN:   [id number]     Follow-Up Visit:  12 months  Patient Completed Questionnaire?:  No  Lymphedema Diagnosis?:  No  LYMPHEDEMA INSTRUMENT:   PATIENT POPULATION:    Diagnosed with and treated for cancer at [facility]? Yes    Referred to [facility] after outside cancer treatment?  No    Referred to [facility] for lymphedema management?  No     DIAGNOSTIC DEFINITION OF LYMPHEDEMA:     Other:       IMPACT OF LE ON ADL:   CHANGES IN BMI (UPON LE DIAGNOSIS):  Stable    CHANGES IN ACTIVITY (CHECK ALL THAT APPLY):  Seed Localization and Segmental Mastectomy, SLNBX   RECENT INFECTIONS?:  No   TYPE OF INFECTION(S):  none   CHANGE IN PRESCRIBED MEDICATION?:  No   USE OF COMPRESSION SLEEVE:  No    FOLLOW-UP MEASUREMENTS:  Handedness:  right handed    Ldex Bioimpedance Score:  10.1 (-5.0)  Current Weight:  79.4 kg/29.1 bmi    Pt fell out of bed in July and suffered several fractures, left radius and ulna and 4 small bones in her foot.  She has about 3+ pitting edema in her left ankle and foot today. She is out of range but is asymptomatic.  Feel her elevated Ldex is related to her recent fractures and healing.      Reviewed checklist with patient regarding any recent changes in health history, infections, medications, or general concerns as it relates to lymphedema.  Reinforced and verified adherence to precautions from initial visit.  No concerns or questions.  Discussed current activity level.  Has returned to previous activity level without concern.      Reviewed early signs and symptoms of lymphedema in affected arm.  None indicated.  Verified patient is aware of the early signs and symptoms to watch for as well as when to contact us.      Assessment:  L-dex:> 10% above baseline volume due to fracture.     Measurements: n/a  ROM: WNL   Discussed assessment with patient.  Bilateral upper extremity skin is pink, soft and intact. No rash, erythema or ulcerations noted. Reassured pt and asked her to verbalize understanding of early warning signs of lymphedema. She has a good understanding and knows when to call.     Lymphedema Stage:  Not applicable.     Recommendations:  Continue with precautions, meticulous skin care, weight management as well as active healthy lifestyle.  Verified no referrals indicated at this time for any issues or concerns.       Plan:  Would like to see pt back in about 4 months for a recheck.  Sooner if she starts to have problems. Verified patient has contact information should any questions or concerns arise.      Follow up appointment for lymphedema prevention clinic will be scheduled by patients breast surgeons team.

## 2017-01-11 ENCOUNTER — Encounter: Admit: 2017-01-11 | Discharge: 2017-01-11 | Payer: MEDICARE

## 2017-01-11 NOTE — Telephone Encounter
Pt states she got up this morning and she felt a "strip" under her right arm that was "strange"  She states this is now gone and is not bothering her anymore.  This is the first time this has happened.  Pt states she doesn't need any assistance now.  Encouraged pt to call back if this happened again and she was concerned.  Pt has contact information.

## 2017-04-03 ENCOUNTER — Encounter: Admit: 2017-04-03 | Discharge: 2017-04-03 | Payer: MEDICARE

## 2017-04-03 DIAGNOSIS — G629 Polyneuropathy, unspecified: ICD-10-CM

## 2017-04-03 DIAGNOSIS — S92902A Unspecified fracture of left foot, initial encounter for closed fracture: ICD-10-CM

## 2017-04-03 DIAGNOSIS — K219 Gastro-esophageal reflux disease without esophagitis: ICD-10-CM

## 2017-04-03 DIAGNOSIS — C50112 Malignant neoplasm of central portion of left female breast: Principal | ICD-10-CM

## 2017-04-03 DIAGNOSIS — Z9221 Personal history of antineoplastic chemotherapy: ICD-10-CM

## 2017-04-03 DIAGNOSIS — G35 Multiple sclerosis: ICD-10-CM

## 2017-04-03 DIAGNOSIS — Z923 Personal history of irradiation: ICD-10-CM

## 2017-04-03 DIAGNOSIS — M549 Dorsalgia, unspecified: ICD-10-CM

## 2017-04-03 DIAGNOSIS — S62109A Fracture of unspecified carpal bone, unspecified wrist, initial encounter for closed fracture: ICD-10-CM

## 2017-04-03 DIAGNOSIS — C801 Malignant (primary) neoplasm, unspecified: ICD-10-CM

## 2017-04-03 DIAGNOSIS — E785 Hyperlipidemia, unspecified: ICD-10-CM

## 2017-04-03 DIAGNOSIS — S8292XS Unspecified fracture of left lower leg, sequela: Secondary | ICD-10-CM

## 2017-04-03 DIAGNOSIS — M1990 Unspecified osteoarthritis, unspecified site: ICD-10-CM

## 2017-04-03 DIAGNOSIS — I10 Essential (primary) hypertension: Principal | ICD-10-CM

## 2017-04-03 DIAGNOSIS — Z171 Estrogen receptor negative status [ER-]: ICD-10-CM

## 2017-04-03 MED ORDER — GABAPENTIN 300 MG PO CAP
ORAL_CAPSULE | Freq: Three times a day (TID) | 3 refills | Status: SS
Start: 2017-04-03 — End: 2017-07-30

## 2017-04-25 ENCOUNTER — Encounter: Admit: 2017-04-25 | Discharge: 2017-04-25 | Payer: MEDICARE

## 2017-04-25 DIAGNOSIS — E785 Hyperlipidemia, unspecified: ICD-10-CM

## 2017-04-25 DIAGNOSIS — G35 Multiple sclerosis: ICD-10-CM

## 2017-04-25 DIAGNOSIS — Z923 Personal history of irradiation: ICD-10-CM

## 2017-04-25 DIAGNOSIS — I890 Lymphedema, not elsewhere classified: Principal | ICD-10-CM

## 2017-04-25 DIAGNOSIS — K219 Gastro-esophageal reflux disease without esophagitis: ICD-10-CM

## 2017-04-25 DIAGNOSIS — Z853 Personal history of malignant neoplasm of breast: ICD-10-CM

## 2017-04-25 DIAGNOSIS — M1990 Unspecified osteoarthritis, unspecified site: ICD-10-CM

## 2017-04-25 DIAGNOSIS — Z08 Encounter for follow-up examination after completed treatment for malignant neoplasm: ICD-10-CM

## 2017-04-25 DIAGNOSIS — M549 Dorsalgia, unspecified: ICD-10-CM

## 2017-04-25 DIAGNOSIS — Z9189 Other specified personal risk factors, not elsewhere classified: ICD-10-CM

## 2017-04-25 DIAGNOSIS — S92902A Unspecified fracture of left foot, initial encounter for closed fracture: ICD-10-CM

## 2017-04-25 DIAGNOSIS — G629 Polyneuropathy, unspecified: ICD-10-CM

## 2017-04-25 DIAGNOSIS — I10 Essential (primary) hypertension: ICD-10-CM

## 2017-04-25 DIAGNOSIS — Z9221 Personal history of antineoplastic chemotherapy: ICD-10-CM

## 2017-04-25 DIAGNOSIS — C801 Malignant (primary) neoplasm, unspecified: ICD-10-CM

## 2017-04-25 DIAGNOSIS — S62109A Fracture of unspecified carpal bone, unspecified wrist, initial encounter for closed fracture: ICD-10-CM

## 2017-06-01 ENCOUNTER — Encounter: Admit: 2017-06-01 | Discharge: 2017-06-01 | Payer: MEDICARE

## 2017-06-01 DIAGNOSIS — G629 Polyneuropathy, unspecified: ICD-10-CM

## 2017-06-01 DIAGNOSIS — I10 Essential (primary) hypertension: Principal | ICD-10-CM

## 2017-06-01 DIAGNOSIS — S92902A Unspecified fracture of left foot, initial encounter for closed fracture: ICD-10-CM

## 2017-06-01 DIAGNOSIS — S62109A Fracture of unspecified carpal bone, unspecified wrist, initial encounter for closed fracture: ICD-10-CM

## 2017-06-01 DIAGNOSIS — G35 Multiple sclerosis: ICD-10-CM

## 2017-06-01 DIAGNOSIS — Z9221 Personal history of antineoplastic chemotherapy: ICD-10-CM

## 2017-06-01 DIAGNOSIS — C801 Malignant (primary) neoplasm, unspecified: ICD-10-CM

## 2017-06-01 DIAGNOSIS — M1990 Unspecified osteoarthritis, unspecified site: ICD-10-CM

## 2017-06-01 DIAGNOSIS — M549 Dorsalgia, unspecified: ICD-10-CM

## 2017-06-01 DIAGNOSIS — K219 Gastro-esophageal reflux disease without esophagitis: ICD-10-CM

## 2017-06-01 DIAGNOSIS — E785 Hyperlipidemia, unspecified: ICD-10-CM

## 2017-07-28 ENCOUNTER — Inpatient Hospital Stay: Admit: 2017-07-28 | Discharge: 2017-07-30 | Disposition: A | Discharge status: Home / Self Care | Payer: MEDICARE

## 2017-07-28 DIAGNOSIS — R5383 Other fatigue: ICD-10-CM

## 2017-07-28 DIAGNOSIS — G629 Polyneuropathy, unspecified: ICD-10-CM

## 2017-07-28 DIAGNOSIS — D649 Anemia, unspecified: ICD-10-CM

## 2017-07-28 LAB — CBC AND DIFF
Lab: 1 % (ref 0–2)
Lab: 1 % — ABNORMAL LOW (ref 0–10)
Lab: 18 % — ABNORMAL HIGH (ref 11–15)
Lab: 2 % (ref >60)
Lab: 2.4 10*3/uL (ref 1.8–7.0)
Lab: 21 pg — ABNORMAL LOW (ref 26–34)
Lab: 25 % — ABNORMAL LOW (ref 36–45)
Lab: 29 g/dL — ABNORMAL LOW (ref 32.0–36.0)
Lab: 3.5 M/UL — ABNORMAL LOW (ref 4.0–5.0)
Lab: 350 K/UL (ref 150–400)
Lab: 38 % (ref 24–44)
Lab: 4.6 K/UL (ref 4.5–11.0)
Lab: 5 % — ABNORMAL LOW (ref >60)
Lab: 53 % (ref 41–77)
Lab: 6.2 FL — ABNORMAL LOW (ref 7–11)
Lab: 7.6 g/dL — ABNORMAL LOW (ref 12.0–15.0)
Lab: 72 FL — ABNORMAL LOW (ref 80–100)

## 2017-07-28 LAB — PROTIME INR (PT): Lab: 0.9 (ref 0.8–1.2)

## 2017-07-28 LAB — URINALYSIS DIPSTICK
Lab: NEGATIVE
Lab: NEGATIVE
Lab: NEGATIVE
Lab: NEGATIVE
Lab: NEGATIVE
Lab: NEGATIVE

## 2017-07-28 LAB — LACTIC ACID (BG - RAPID LACTATE): Lab: 1.4 MMOL/L (ref 0.5–2.0)

## 2017-07-28 LAB — RETICULOCYTE COUNT
Lab: 1.7 %
Lab: 2.8 % — ABNORMAL HIGH (ref 0.5–2.0)
Lab: 97 10*3/uL — ABNORMAL HIGH (ref 30–94)

## 2017-07-28 LAB — URINALYSIS, MICROSCOPIC

## 2017-07-28 LAB — IRON + BINDING CAPACITY + %SAT+ FERRITIN
Lab: 14 ug/dL — ABNORMAL LOW (ref 50–160)
Lab: 639 ug/dL — ABNORMAL HIGH (ref >60)
Lab: 7 ng/mL — ABNORMAL LOW (ref 10–200)

## 2017-07-28 LAB — COMPREHENSIVE METABOLIC PANEL
Lab: 136 MMOL/L — ABNORMAL LOW (ref 137–147)
Lab: 4.1 MMOL/L (ref 3.5–5.1)

## 2017-07-28 LAB — PTT (APTT): Lab: 22 s — ABNORMAL LOW (ref 24.0–36.5)

## 2017-07-28 LAB — LDH-LACTATE DEHYDROGENASE: Lab: 185 U/L — ABNORMAL LOW (ref >60)

## 2017-07-28 MED ORDER — MAGNESIUM SULFATE IN D5W 1 GRAM/100 ML IV PGBK
1 g | INTRAVENOUS | 0 refills | Status: DC | PRN
Start: 2017-07-28 — End: 2017-07-31

## 2017-07-28 MED ORDER — DIPHENHYDRAMINE HCL 50 MG/ML IJ SOLN
25 mg | INTRAVENOUS | 0 refills | Status: DC | PRN
Start: 2017-07-28 — End: 2017-07-31

## 2017-07-28 MED ORDER — EPINEPHRINE HCL (PF) 1 MG/ML (1 ML) IJ SOLN
.3-.5 mg | INTRAMUSCULAR | 0 refills | Status: DC | PRN
Start: 2017-07-28 — End: 2017-07-31

## 2017-07-28 MED ORDER — PANTOPRAZOLE 40 MG IV SOLR
40 mg | Freq: Two times a day (BID) | INTRAVENOUS | 0 refills | Status: DC
Start: 2017-07-28 — End: 2017-07-31
  Administered 2017-07-29 – 2017-07-30 (×2): 40 mg via INTRAVENOUS

## 2017-07-28 MED ORDER — LORATADINE 10 MG PO TAB
10 mg | Freq: Every morning | ORAL | 0 refills | Status: DC
Start: 2017-07-28 — End: 2017-07-31
  Administered 2017-07-29 – 2017-07-30 (×2): 10 mg via ORAL

## 2017-07-28 MED ORDER — IRON SUCROSE 300 MG IRON/15 ML IV SOLN
300 mg | Freq: Every day | INTRAVENOUS | 0 refills | Status: CP
Start: 2017-07-28 — End: ?
  Administered 2017-07-29 – 2017-07-30 (×6): 300 mg via INTRAVENOUS

## 2017-07-28 MED ORDER — ACETAMINOPHEN 500 MG PO TAB
1000 mg | Freq: Two times a day (BID) | ORAL | 0 refills | Status: DC
Start: 2017-07-28 — End: 2017-07-31
  Administered 2017-07-29 – 2017-07-30 (×4): 1000 mg via ORAL

## 2017-07-28 MED ORDER — POTASSIUM CHLORIDE 20 MEQ PO TBTQ
40-60 meq | ORAL | 0 refills | Status: DC | PRN
Start: 2017-07-28 — End: 2017-07-31

## 2017-07-28 MED ORDER — POTASSIUM CHLORIDE 20 MEQ/15 ML PO LIQD
40-60 meq | NASOGASTRIC | 0 refills | Status: DC | PRN
Start: 2017-07-28 — End: 2017-07-31

## 2017-07-28 MED ORDER — GABAPENTIN 300 MG PO CAP
900 mg | Freq: Two times a day (BID) | ORAL | 0 refills | Status: DC
Start: 2017-07-28 — End: 2017-07-31
  Administered 2017-07-29 – 2017-07-30 (×4): 900 mg via ORAL

## 2017-07-28 MED ORDER — PANTOPRAZOLE 40 MG IV SOLR
80 mg | Freq: Once | INTRAVENOUS | 0 refills | Status: CP
Start: 2017-07-28 — End: ?
  Administered 2017-07-28: 22:00:00 80 mg via INTRAVENOUS

## 2017-07-28 MED ORDER — SODIUM PHOSPHATE IVPB
8 MMOL | INTRAVENOUS | 0 refills | Status: DC | PRN
Start: 2017-07-28 — End: 2017-07-31

## 2017-07-29 LAB — COMPREHENSIVE METABOLIC PANEL: Lab: 139 MMOL/L — ABNORMAL HIGH (ref 137–147)

## 2017-07-29 LAB — FOLATE, SERUM: Lab: 9.1 ng/mL — ABNORMAL HIGH (ref >3.9)

## 2017-07-29 LAB — CBC: Lab: 4.4 K/UL — ABNORMAL LOW (ref >60)

## 2017-07-29 LAB — HAPTOGLOBIN: Lab: 102 mg/dL (ref 16–200)

## 2017-07-29 LAB — VITAMIN B12: Lab: 118 pg/mL — ABNORMAL LOW (ref 180–914)

## 2017-07-29 LAB — MAGNESIUM: Lab: 2.3 mg/dL — ABNORMAL LOW (ref 1.6–2.6)

## 2017-07-30 ENCOUNTER — Encounter: Admit: 2017-07-30 | Discharge: 2017-07-30 | Payer: MEDICARE

## 2017-07-30 DIAGNOSIS — G35 Multiple sclerosis: ICD-10-CM

## 2017-07-30 DIAGNOSIS — Z853 Personal history of malignant neoplasm of breast: ICD-10-CM

## 2017-07-30 DIAGNOSIS — K5909 Other constipation: ICD-10-CM

## 2017-07-30 DIAGNOSIS — E538 Deficiency of other specified B group vitamins: Secondary | ICD-10-CM

## 2017-07-30 DIAGNOSIS — Z87891 Personal history of nicotine dependence: ICD-10-CM

## 2017-07-30 DIAGNOSIS — Z8711 Personal history of peptic ulcer disease: ICD-10-CM

## 2017-07-30 DIAGNOSIS — K449 Diaphragmatic hernia without obstruction or gangrene: ICD-10-CM

## 2017-07-30 DIAGNOSIS — I10 Essential (primary) hypertension: ICD-10-CM

## 2017-07-30 DIAGNOSIS — E785 Hyperlipidemia, unspecified: ICD-10-CM

## 2017-07-30 DIAGNOSIS — Z66 Do not resuscitate: ICD-10-CM

## 2017-07-30 DIAGNOSIS — Z9221 Personal history of antineoplastic chemotherapy: ICD-10-CM

## 2017-07-30 DIAGNOSIS — D509 Iron deficiency anemia, unspecified: ICD-10-CM

## 2017-07-30 DIAGNOSIS — K219 Gastro-esophageal reflux disease without esophagitis: ICD-10-CM

## 2017-07-30 DIAGNOSIS — K922 Gastrointestinal hemorrhage, unspecified: Principal | ICD-10-CM

## 2017-07-30 LAB — MAGNESIUM: Lab: 2.3 mg/dL — ABNORMAL LOW (ref 1.6–2.6)

## 2017-07-30 MED ORDER — GABAPENTIN 300 MG PO CAP
ORAL_CAPSULE | Freq: Two times a day (BID) | 3 refills | Status: AC
Start: 2017-07-30 — End: 2019-06-14

## 2017-07-30 MED ORDER — FERROUS SULFATE 325 MG (65 MG IRON) PO TBEC
325 mg | ORAL_TABLET | Freq: Every day | ORAL | 1 refills | 30.00000 days | Status: AC
Start: 2017-07-30 — End: 2019-05-04

## 2017-07-30 MED ORDER — CYANOCOBALAMIN (VITAMIN B-12) 1,000 MCG PO TAB
1000 ug | ORAL_TABLET | Freq: Every day | ORAL | 1 refills | 30.00000 days | Status: AC
Start: 2017-07-30 — End: 2018-09-04

## 2017-07-30 MED ORDER — CYANOCOBALAMIN (VITAMIN B-12) 1,000 MCG/ML IJ SOLN
1000 ug | Freq: Every day | INTRAMUSCULAR | 0 refills | Status: DC
Start: 2017-07-30 — End: 2017-07-31
  Administered 2017-07-30: 14:00:00 1000 ug via INTRAMUSCULAR

## 2017-09-13 ENCOUNTER — Encounter: Admit: 2017-09-13 | Discharge: 2017-09-13 | Payer: MEDICARE

## 2017-09-13 DIAGNOSIS — M79672 Pain in left foot: Principal | ICD-10-CM

## 2017-09-14 ENCOUNTER — Encounter: Admit: 2017-09-14 | Discharge: 2017-09-14 | Payer: MEDICARE

## 2017-09-18 ENCOUNTER — Ambulatory Visit: Admit: 2017-09-18 | Discharge: 2017-09-19 | Payer: MEDICARE

## 2017-09-18 ENCOUNTER — Ambulatory Visit: Admit: 2017-09-18 | Discharge: 2017-09-18 | Payer: MEDICARE

## 2017-09-18 DIAGNOSIS — E559 Vitamin D deficiency, unspecified: ICD-10-CM

## 2017-09-18 DIAGNOSIS — Z01818 Encounter for other preprocedural examination: ICD-10-CM

## 2017-09-18 DIAGNOSIS — M79672 Pain in left foot: Principal | ICD-10-CM

## 2017-09-18 LAB — 25-OH VITAMIN D (D2 + D3): Lab: 20 ng/mL — ABNORMAL LOW (ref 30–80)

## 2017-09-18 MED ORDER — DICLOFENAC SODIUM 1 % TP GEL
4 g | Freq: Four times a day (QID) | TOPICAL | 1 refills | 19.00000 days | Status: AC | PRN
Start: 2017-09-18 — End: 2018-03-07

## 2017-10-02 ENCOUNTER — Encounter: Admit: 2017-10-02 | Discharge: 2017-10-02 | Payer: MEDICARE

## 2017-10-02 DIAGNOSIS — G629 Polyneuropathy, unspecified: ICD-10-CM

## 2017-10-02 DIAGNOSIS — G35 Multiple sclerosis: ICD-10-CM

## 2017-10-02 DIAGNOSIS — Z9221 Personal history of antineoplastic chemotherapy: ICD-10-CM

## 2017-10-02 DIAGNOSIS — C50112 Malignant neoplasm of central portion of left female breast: Principal | ICD-10-CM

## 2017-10-02 DIAGNOSIS — E785 Hyperlipidemia, unspecified: ICD-10-CM

## 2017-10-02 DIAGNOSIS — Z923 Personal history of irradiation: ICD-10-CM

## 2017-10-02 DIAGNOSIS — T454X5A Adverse effect of iron and its compounds, initial encounter: ICD-10-CM

## 2017-10-02 DIAGNOSIS — K5903 Drug induced constipation: ICD-10-CM

## 2017-10-02 DIAGNOSIS — Z171 Estrogen receptor negative status [ER-]: ICD-10-CM

## 2017-10-02 DIAGNOSIS — K219 Gastro-esophageal reflux disease without esophagitis: ICD-10-CM

## 2017-10-02 DIAGNOSIS — E538 Deficiency of other specified B group vitamins: ICD-10-CM

## 2017-10-02 DIAGNOSIS — S92902A Unspecified fracture of left foot, initial encounter for closed fracture: ICD-10-CM

## 2017-10-02 DIAGNOSIS — M549 Dorsalgia, unspecified: ICD-10-CM

## 2017-10-02 DIAGNOSIS — S62109A Fracture of unspecified carpal bone, unspecified wrist, initial encounter for closed fracture: ICD-10-CM

## 2017-10-02 DIAGNOSIS — M1990 Unspecified osteoarthritis, unspecified site: ICD-10-CM

## 2017-10-02 DIAGNOSIS — I10 Essential (primary) hypertension: Principal | ICD-10-CM

## 2017-10-02 DIAGNOSIS — C801 Malignant (primary) neoplasm, unspecified: ICD-10-CM

## 2017-10-02 LAB — CBC AND DIFF
Lab: 0 10*3/uL (ref 0–0.20)
Lab: 0.1 10*3/uL (ref 0–0.45)
Lab: 0.5 10*3/uL (ref 0–0.80)
Lab: 1 % (ref >60)
Lab: 1.9 10*3/uL (ref 1.0–4.8)
Lab: 11 % (ref 4–12)
Lab: 14 g/dL (ref 12.0–15.0)
Lab: 2 % (ref >60)
Lab: 2.3 10*3/uL (ref 1.8–7.0)
Lab: 205 10*3/uL (ref 150–400)
Lab: 22 % — ABNORMAL HIGH (ref 11–15)
Lab: 32 pg (ref 26–34)
Lab: 33 g/dL (ref 32.0–36.0)
Lab: 39 % (ref 24–44)
Lab: 4.4 M/UL (ref 4.0–5.0)
Lab: 4.9 K/UL (ref 4.5–11.0)
Lab: 43 % (ref 36–45)
Lab: 6.4 FL — ABNORMAL LOW (ref 7–11)
Lab: 97 FL (ref 80–100)

## 2017-10-02 LAB — COMPREHENSIVE METABOLIC PANEL
Lab: 138 MMOL/L (ref 137–147)
Lab: 4.7 MMOL/L (ref 3.5–5.1)

## 2017-10-06 ENCOUNTER — Encounter: Admit: 2017-10-06 | Discharge: 2017-10-06 | Payer: MEDICARE

## 2017-10-06 DIAGNOSIS — M859 Disorder of bone density and structure, unspecified: ICD-10-CM

## 2017-10-06 DIAGNOSIS — M79672 Pain in left foot: Principal | ICD-10-CM

## 2017-10-06 MED ORDER — ERGOCALCIFEROL (VITAMIN D2) 50,000 UNIT PO CAP
1 | ORAL_CAPSULE | ORAL | 0 refills | 30.00000 days | Status: AC
Start: 2017-10-06 — End: 2018-03-07

## 2017-10-17 ENCOUNTER — Encounter: Admit: 2017-10-17 | Discharge: 2017-10-17 | Payer: MEDICARE

## 2017-10-17 DIAGNOSIS — S62109A Fracture of unspecified carpal bone, unspecified wrist, initial encounter for closed fracture: ICD-10-CM

## 2017-10-17 DIAGNOSIS — Z1231 Encounter for screening mammogram for malignant neoplasm of breast: Principal | ICD-10-CM

## 2017-10-17 DIAGNOSIS — Z9221 Personal history of antineoplastic chemotherapy: ICD-10-CM

## 2017-10-17 DIAGNOSIS — C801 Malignant (primary) neoplasm, unspecified: ICD-10-CM

## 2017-10-17 DIAGNOSIS — Z853 Personal history of malignant neoplasm of breast: ICD-10-CM

## 2017-10-17 DIAGNOSIS — G629 Polyneuropathy, unspecified: ICD-10-CM

## 2017-10-17 DIAGNOSIS — G35 Multiple sclerosis: ICD-10-CM

## 2017-10-17 DIAGNOSIS — Z9189 Other specified personal risk factors, not elsewhere classified: ICD-10-CM

## 2017-10-17 DIAGNOSIS — Z08 Encounter for follow-up examination after completed treatment for malignant neoplasm: Secondary | ICD-10-CM

## 2017-10-17 DIAGNOSIS — I10 Essential (primary) hypertension: Principal | ICD-10-CM

## 2017-10-17 DIAGNOSIS — M1990 Unspecified osteoarthritis, unspecified site: ICD-10-CM

## 2017-10-17 DIAGNOSIS — K219 Gastro-esophageal reflux disease without esophagitis: ICD-10-CM

## 2017-10-17 DIAGNOSIS — S92902A Unspecified fracture of left foot, initial encounter for closed fracture: ICD-10-CM

## 2017-10-17 DIAGNOSIS — E785 Hyperlipidemia, unspecified: ICD-10-CM

## 2017-10-17 DIAGNOSIS — M549 Dorsalgia, unspecified: ICD-10-CM

## 2017-10-18 ENCOUNTER — Encounter: Admit: 2017-10-18 | Discharge: 2017-10-18 | Payer: MEDICARE

## 2017-11-21 ENCOUNTER — Ambulatory Visit: Admit: 2017-11-21 | Discharge: 2017-11-21 | Payer: MEDICARE

## 2017-11-21 DIAGNOSIS — M79672 Pain in left foot: ICD-10-CM

## 2017-11-21 DIAGNOSIS — M859 Disorder of bone density and structure, unspecified: Principal | ICD-10-CM

## 2017-11-21 LAB — 25-OH VITAMIN D (D2 + D3): Lab: 32 ng/mL (ref 30–80)

## 2017-12-03 IMAGING — CR LOW_EXM
3 series · 3 of 3 positions shown · non-contrast
Comparison: none

[foot]
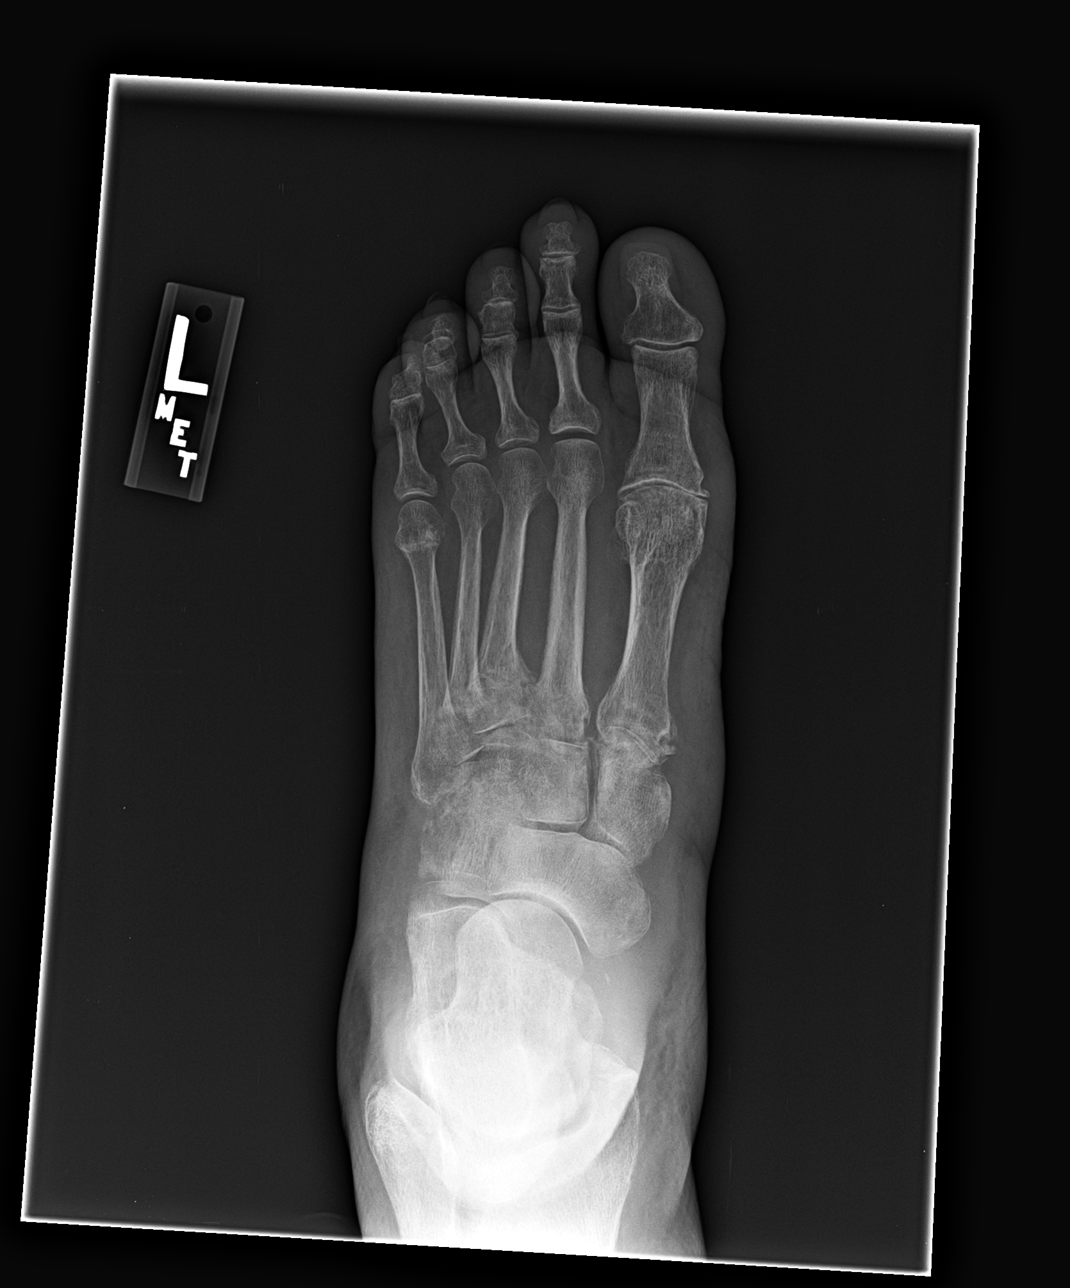

[foot obl]
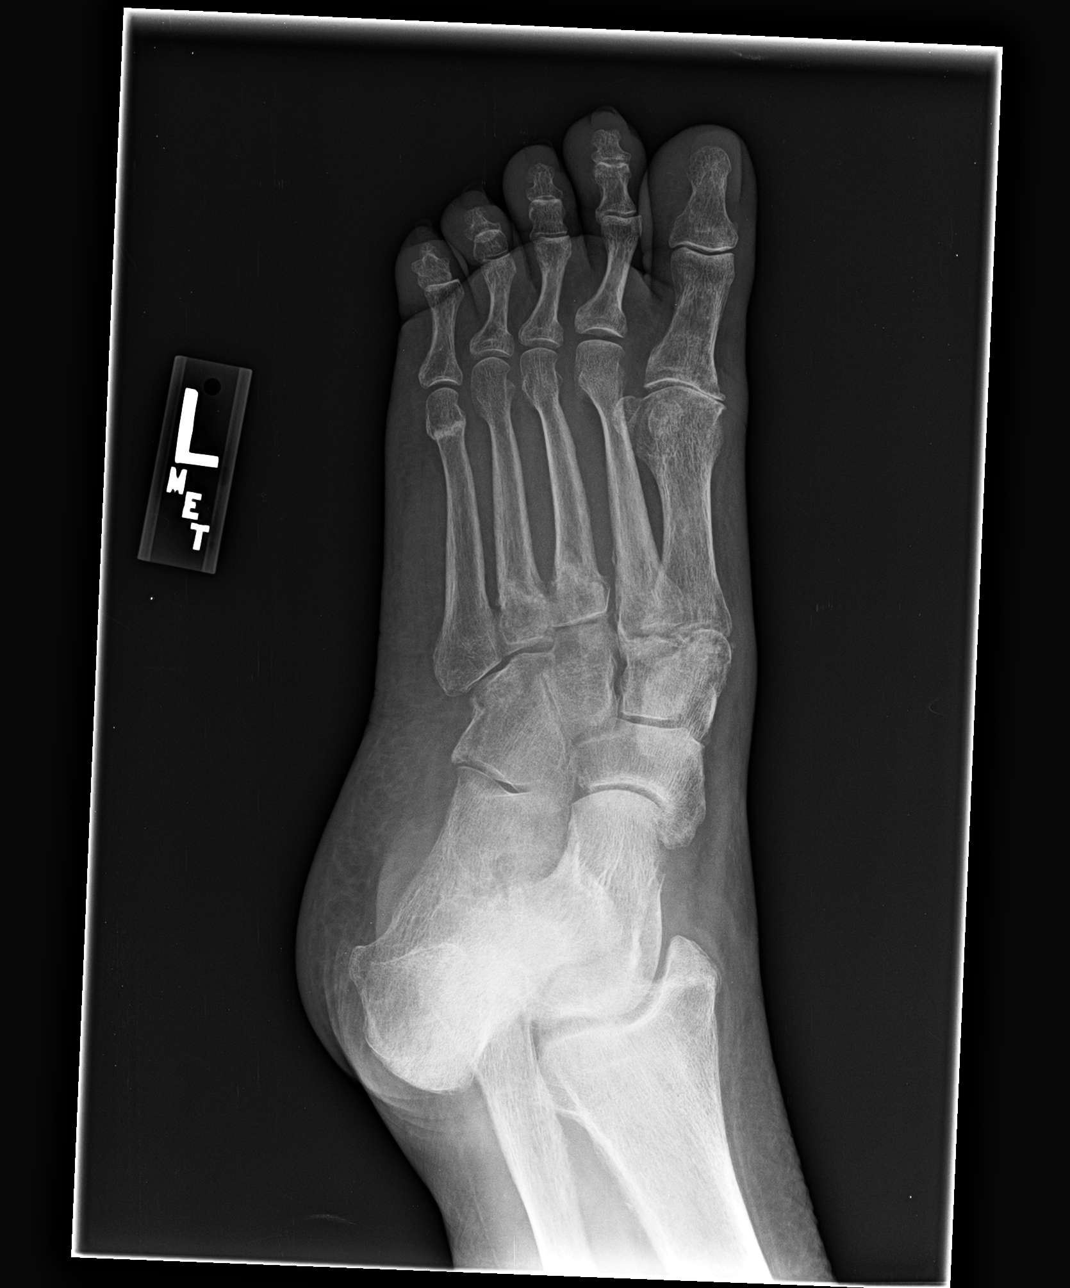

[foot lat]
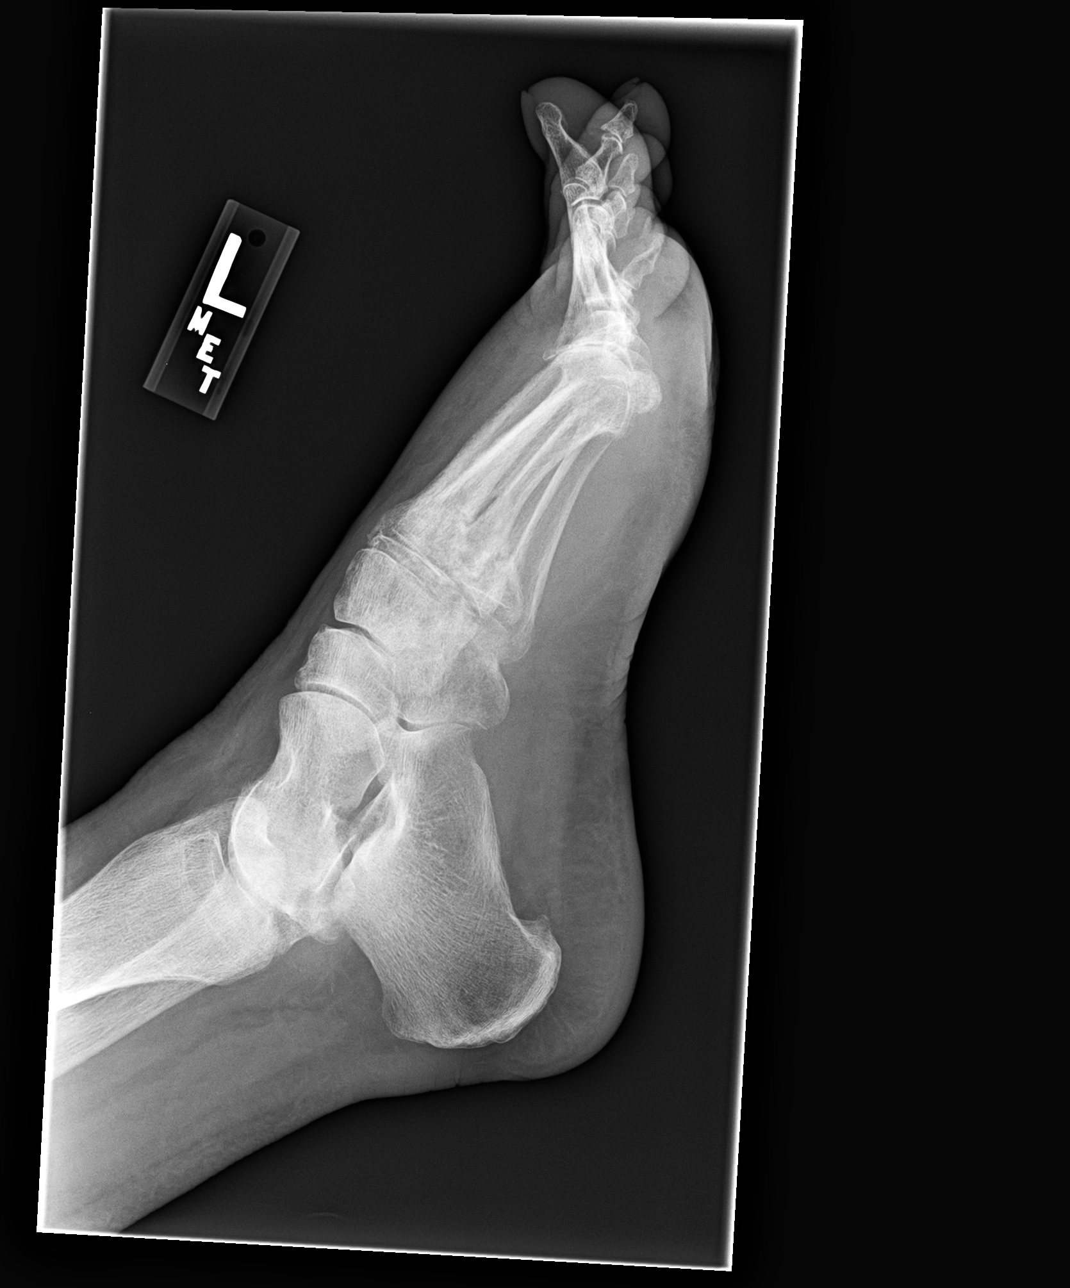

[3 of 3 positions shown; findings below may reference images not displayed]

DIAGNOSTIC STUDIES

EXAM

Left foot.

INDICATION

follow-up left foot fracture
F/U LT. FOOT FX.

TECHNIQUE

AP, oblique, and lateral left foot views.

COMPARISONS

26 September, 2016.

FINDINGS

Healing fractures of the bases of the 1st through 4th metatarsals. Evidence of injury of the
Lisfranc ligament with mild widening of the 2nd metatarsal base articulation with the lateral
distal medial cuneiform. Soft tissue swelling. Diffuse bone demineralization. Punctate
calcification just proximal to the medial navicular is noted.

IMPRESSION

The alignment is not significantly changed. Healing fractures of the metatarsals present with
increased fracture conspicuity of the 1st metatarsal and evidence of Lisfranc fracture subluxation.

## 2017-12-03 IMAGING — CR UP_EXM
2 series · 2 of 2 positions shown · non-contrast
Comparison: none

[wrist pa]
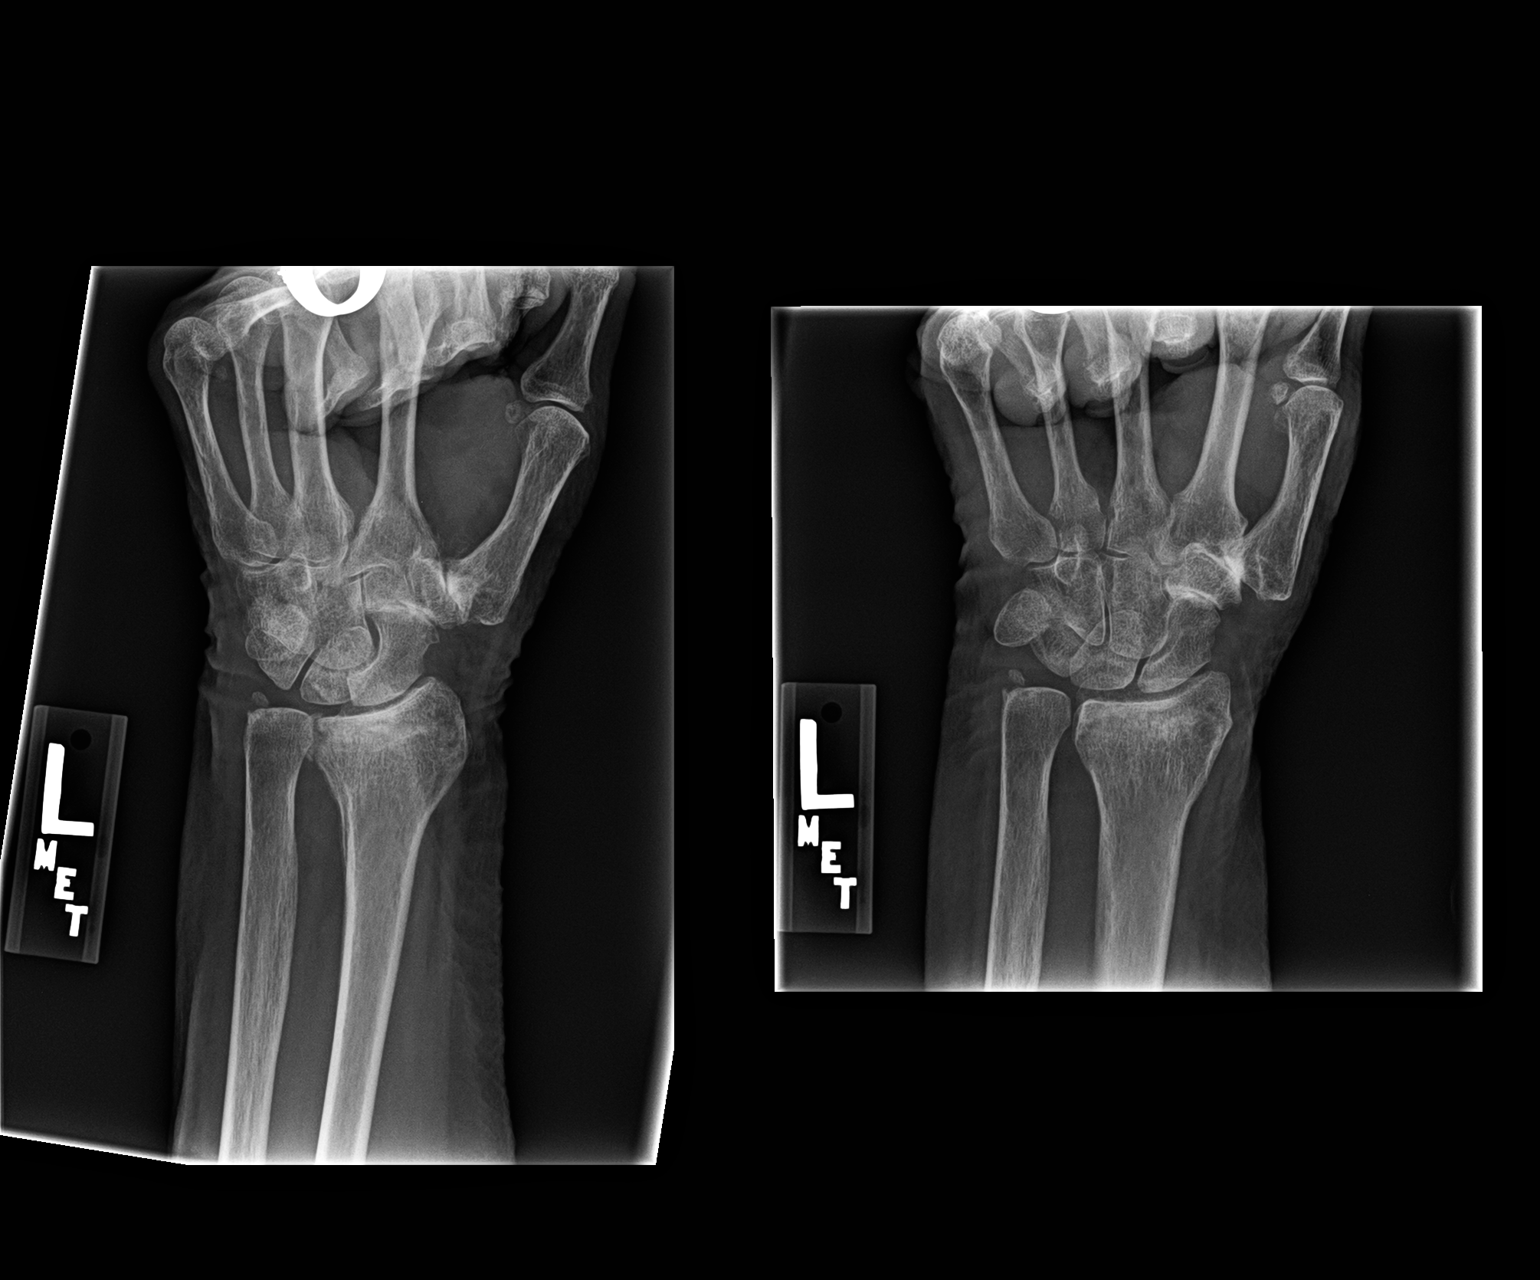

[wrist lat]
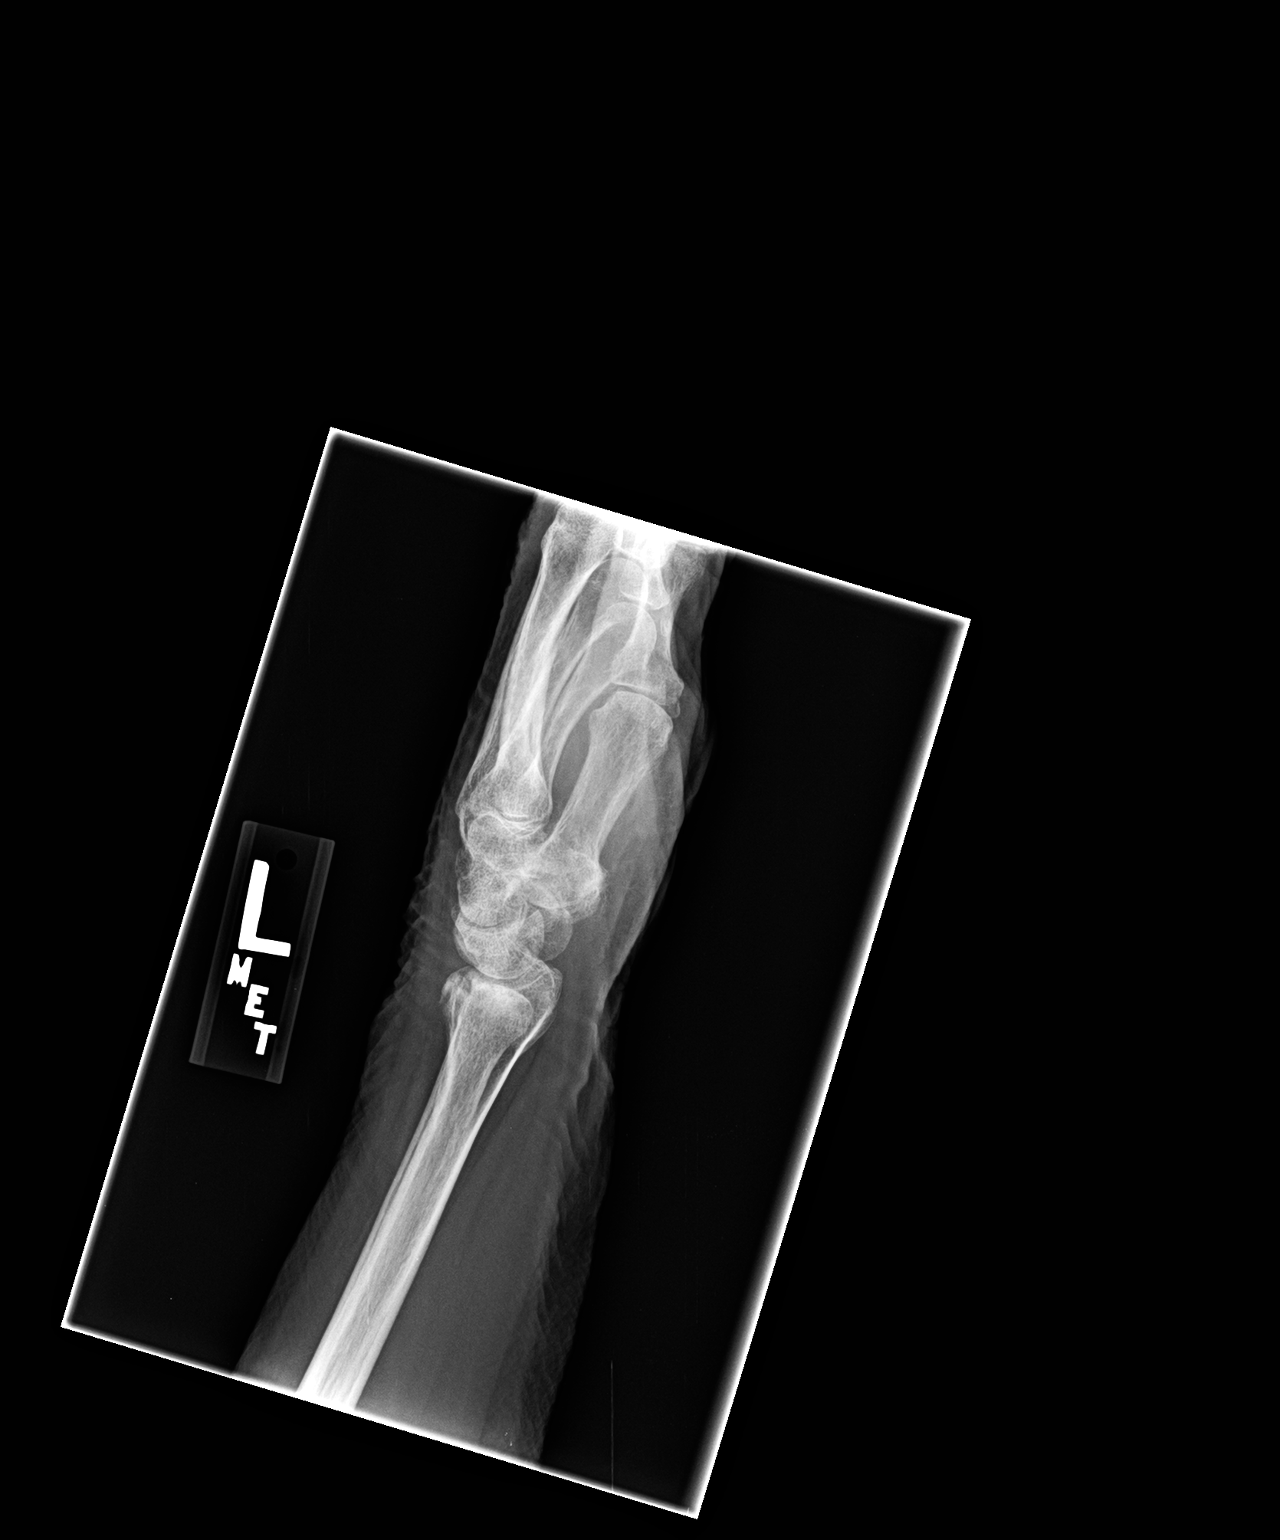

[2 of 2 positions shown; findings below may reference images not displayed]

DIAGNOSTIC STUDIES

EXAM

Left wrist series.

INDICATION

follow-up left wrist fracture
F/U LEFT WRIST FX.

TECHNIQUE

PA, oblique, and lateral left wrist views.

COMPARISONS

26 September, 2016.

FINDINGS

Healing fracture of the distal radius with ulnar styloid process fracture again noted. There is
increased bone remodeling and calcified periostitis. The alignment is not significantly changed.

IMPRESSION

Healing fracture of the distal radius.

## 2018-01-22 ENCOUNTER — Encounter: Admit: 2018-01-22 | Discharge: 2018-01-22 | Payer: MEDICARE

## 2018-01-22 ENCOUNTER — Ambulatory Visit: Admit: 2018-01-22 | Discharge: 2018-01-23 | Payer: MEDICARE

## 2018-01-22 DIAGNOSIS — E785 Hyperlipidemia, unspecified: ICD-10-CM

## 2018-01-22 DIAGNOSIS — S62109A Fracture of unspecified carpal bone, unspecified wrist, initial encounter for closed fracture: ICD-10-CM

## 2018-01-22 DIAGNOSIS — C801 Malignant (primary) neoplasm, unspecified: ICD-10-CM

## 2018-01-22 DIAGNOSIS — G629 Polyneuropathy, unspecified: ICD-10-CM

## 2018-01-22 DIAGNOSIS — M549 Dorsalgia, unspecified: ICD-10-CM

## 2018-01-22 DIAGNOSIS — G35 Multiple sclerosis: ICD-10-CM

## 2018-01-22 DIAGNOSIS — S92902A Unspecified fracture of left foot, initial encounter for closed fracture: ICD-10-CM

## 2018-01-22 DIAGNOSIS — M1990 Unspecified osteoarthritis, unspecified site: ICD-10-CM

## 2018-01-22 DIAGNOSIS — Z9221 Personal history of antineoplastic chemotherapy: ICD-10-CM

## 2018-01-22 DIAGNOSIS — K219 Gastro-esophageal reflux disease without esophagitis: ICD-10-CM

## 2018-01-22 DIAGNOSIS — I10 Essential (primary) hypertension: Principal | ICD-10-CM

## 2018-01-23 DIAGNOSIS — Z01818 Encounter for other preprocedural examination: Secondary | ICD-10-CM

## 2018-01-23 DIAGNOSIS — M79672 Pain in left foot: Principal | ICD-10-CM

## 2018-02-01 ENCOUNTER — Ambulatory Visit: Admit: 2018-03-07 | Discharge: 2018-03-08 | Payer: MEDICARE

## 2018-02-01 ENCOUNTER — Encounter: Admit: 2018-02-01 | Discharge: 2018-02-01 | Payer: MEDICARE

## 2018-02-01 DIAGNOSIS — S93622D Sprain of tarsometatarsal ligament of left foot, subsequent encounter: Principal | ICD-10-CM

## 2018-02-01 MED ORDER — CEFAZOLIN 1 GRAM IJ SOLR
2 g | Freq: Once | INTRAVENOUS | 0 refills | Status: CN
Start: 2018-02-01 — End: ?

## 2018-02-09 ENCOUNTER — Ambulatory Visit: Admit: 2018-02-09 | Discharge: 2018-02-09 | Payer: MEDICARE

## 2018-02-09 DIAGNOSIS — Z01818 Encounter for other preprocedural examination: Principal | ICD-10-CM

## 2018-03-01 ENCOUNTER — Encounter: Admit: 2018-03-01 | Discharge: 2018-03-01 | Payer: MEDICARE

## 2018-03-01 DIAGNOSIS — E785 Hyperlipidemia, unspecified: Secondary | ICD-10-CM

## 2018-03-01 DIAGNOSIS — G629 Polyneuropathy, unspecified: Secondary | ICD-10-CM

## 2018-03-01 DIAGNOSIS — M549 Dorsalgia, unspecified: Secondary | ICD-10-CM

## 2018-03-01 DIAGNOSIS — G35 Multiple sclerosis: Secondary | ICD-10-CM

## 2018-03-01 DIAGNOSIS — M1990 Unspecified osteoarthritis, unspecified site: Secondary | ICD-10-CM

## 2018-03-01 DIAGNOSIS — K219 Gastro-esophageal reflux disease without esophagitis: Secondary | ICD-10-CM

## 2018-03-01 DIAGNOSIS — C801 Malignant (primary) neoplasm, unspecified: Secondary | ICD-10-CM

## 2018-03-01 DIAGNOSIS — S62109A Fracture of unspecified carpal bone, unspecified wrist, initial encounter for closed fracture: Secondary | ICD-10-CM

## 2018-03-01 DIAGNOSIS — I10 Essential (primary) hypertension: Secondary | ICD-10-CM

## 2018-03-01 DIAGNOSIS — S92902A Unspecified fracture of left foot, initial encounter for closed fracture: Secondary | ICD-10-CM

## 2018-03-01 DIAGNOSIS — Z9221 Personal history of antineoplastic chemotherapy: Secondary | ICD-10-CM

## 2018-03-07 ENCOUNTER — Encounter: Admit: 2018-03-07 | Discharge: 2018-03-07 | Payer: MEDICARE

## 2018-03-07 DIAGNOSIS — E785 Hyperlipidemia, unspecified: Secondary | ICD-10-CM

## 2018-03-07 DIAGNOSIS — C801 Malignant (primary) neoplasm, unspecified: Secondary | ICD-10-CM

## 2018-03-07 DIAGNOSIS — Z9221 Personal history of antineoplastic chemotherapy: Secondary | ICD-10-CM

## 2018-03-07 DIAGNOSIS — S62109A Fracture of unspecified carpal bone, unspecified wrist, initial encounter for closed fracture: Secondary | ICD-10-CM

## 2018-03-07 DIAGNOSIS — K219 Gastro-esophageal reflux disease without esophagitis: Secondary | ICD-10-CM

## 2018-03-07 DIAGNOSIS — G629 Polyneuropathy, unspecified: Secondary | ICD-10-CM

## 2018-03-07 DIAGNOSIS — S92902A Unspecified fracture of left foot, initial encounter for closed fracture: Secondary | ICD-10-CM

## 2018-03-07 DIAGNOSIS — G35 Multiple sclerosis: Secondary | ICD-10-CM

## 2018-03-07 DIAGNOSIS — I10 Essential (primary) hypertension: Secondary | ICD-10-CM

## 2018-03-07 DIAGNOSIS — M1990 Unspecified osteoarthritis, unspecified site: Secondary | ICD-10-CM

## 2018-03-07 DIAGNOSIS — M549 Dorsalgia, unspecified: Secondary | ICD-10-CM

## 2018-03-07 MED ORDER — FENTANYL CITRATE (PF) 50 MCG/ML IJ SOLN
INTRAVENOUS | 0 refills | Status: CP
Start: 2018-03-07 — End: ?

## 2018-03-07 MED ORDER — DEXAMETHASONE SODIUM PHOSPHATE 4 MG/ML IJ SOLN
INTRAVENOUS | 0 refills | Status: DC
Start: 2018-03-07 — End: 2018-03-07

## 2018-03-07 MED ORDER — ASPIRIN 81 MG PO TBEC
81 mg | ORAL_TABLET | Freq: Every day | ORAL | 0 refills | Status: AC
Start: 2018-03-07 — End: 2018-09-04

## 2018-03-07 MED ORDER — LIDOCAINE (PF) 10 MG/ML (1 %) IJ SOLN
SUBCUTANEOUS | 0 refills | Status: CP
Start: 2018-03-07 — End: ?

## 2018-03-07 MED ORDER — BUPIVACAINE 0.5 % (5 MG/ML) IJ SOLN
0 refills | Status: CP
Start: 2018-03-07 — End: ?

## 2018-03-07 MED ORDER — EPHEDRINE SULFATE 50 MG/5ML SYR (10 MG/ML) (AN)(OSM)
0 refills | Status: DC
Start: 2018-03-07 — End: 2018-03-07

## 2018-03-07 MED ORDER — OXYCODONE 5 MG PO TAB
5 mg | ORAL_TABLET | ORAL | 0 refills | 6.00000 days | Status: AC | PRN
Start: 2018-03-07 — End: 2018-03-20
  Filled 2018-03-07 (×2): qty 60, 10d supply, fill #1

## 2018-03-07 MED ORDER — BACITRACIN ZINC 500 UNIT/GRAM TP OINT
0 refills | Status: DC
Start: 2018-03-07 — End: 2018-03-12

## 2018-03-07 MED ORDER — HALOPERIDOL LACTATE 5 MG/ML IJ SOLN
1 mg | Freq: Once | INTRAVENOUS | 0 refills | Status: AC | PRN
Start: 2018-03-07 — End: ?

## 2018-03-07 MED ORDER — ONDANSETRON HCL 4 MG PO TAB
4 mg | ORAL_TABLET | ORAL | 0 refills | 8.00000 days | Status: AC | PRN
Start: 2018-03-07 — End: 2018-03-20
  Filled 2018-03-07 (×2): qty 30, 10d supply, fill #1

## 2018-03-07 MED ORDER — ACETAMINOPHEN 500 MG PO TAB
1000 mg | Freq: Once | ORAL | 0 refills | Status: CP
Start: 2018-03-07 — End: ?

## 2018-03-07 MED ORDER — FENTANYL CITRATE (PF) 50 MCG/ML IJ SOLN
25 ug | INTRAVENOUS | 0 refills | Status: DC | PRN
Start: 2018-03-07 — End: 2018-03-12

## 2018-03-07 MED ORDER — FENTANYL CITRATE (PF) 50 MCG/ML IJ SOLN
50 ug | Freq: Once | INTRAVENOUS | 0 refills | Status: CP
Start: 2018-03-07 — End: ?

## 2018-03-07 MED ORDER — DOCUSATE SODIUM 100 MG PO CAP
100 mg | ORAL_CAPSULE | Freq: Two times a day (BID) | ORAL | 0 refills | Status: AC
Start: 2018-03-07 — End: 2018-03-20

## 2018-03-07 MED ORDER — ONDANSETRON HCL (PF) 4 MG/2 ML IJ SOLN
INTRAVENOUS | 0 refills | Status: DC
Start: 2018-03-07 — End: 2018-03-07

## 2018-03-07 MED ORDER — ONDANSETRON HCL (PF) 4 MG/2 ML IJ SOLN
4 mg | Freq: Once | INTRAVENOUS | 0 refills | Status: AC | PRN
Start: 2018-03-07 — End: ?

## 2018-03-07 MED ORDER — LIDOCAINE (PF) 200 MG/10 ML (2 %) IJ SYRG
0 refills | Status: DC
Start: 2018-03-07 — End: 2018-03-07

## 2018-03-07 MED ORDER — PROMETHAZINE 25 MG/ML IJ SOLN
6.25 mg | INTRAVENOUS | 0 refills | Status: DC | PRN
Start: 2018-03-07 — End: 2018-03-12

## 2018-03-07 MED ORDER — HYDROMORPHONE (PF) 2 MG/ML IJ SYRG
.5-1 mg | INTRAVENOUS | 0 refills | Status: DC | PRN
Start: 2018-03-07 — End: 2018-03-12

## 2018-03-07 MED ORDER — OXYCODONE 5 MG PO TAB
5-10 mg | Freq: Once | ORAL | 0 refills | Status: AC | PRN
Start: 2018-03-07 — End: ?

## 2018-03-07 MED ORDER — SODIUM CHLORIDE 0.9 % IR SOLN
0 refills | Status: DC
Start: 2018-03-07 — End: 2018-03-12

## 2018-03-07 MED ORDER — CEFAZOLIN 1 GRAM IJ SOLR
2 g | Freq: Once | INTRAVENOUS | 0 refills | Status: CP
Start: 2018-03-07 — End: ?

## 2018-03-07 MED ORDER — LACTATED RINGERS IV SOLP
1000 mL | INTRAVENOUS | 0 refills | Status: DC
Start: 2018-03-07 — End: 2018-03-12

## 2018-03-07 MED ORDER — FENTANYL CITRATE (PF) 50 MCG/ML IJ SOLN
50 ug | INTRAVENOUS | 0 refills | Status: DC | PRN
Start: 2018-03-07 — End: 2018-03-12

## 2018-03-07 MED ORDER — PROPOFOL INJ 10 MG/ML IV VIAL
0 refills | Status: DC
Start: 2018-03-07 — End: 2018-03-07

## 2018-03-07 MED ORDER — DEXAMETHASONE SODIUM PHOSPHATE 10 MG/ML IJ SOLN
0 refills | Status: CP
Start: 2018-03-07 — End: ?

## 2018-03-07 MED ORDER — FENTANYL CITRATE (PF) 50 MCG/ML IJ SOLN
0 refills | Status: DC
Start: 2018-03-07 — End: 2018-03-07

## 2018-03-08 DIAGNOSIS — M19079 Primary osteoarthritis, unspecified ankle and foot: Principal | ICD-10-CM

## 2018-03-15 ENCOUNTER — Encounter: Admit: 2018-03-15 | Discharge: 2018-03-15 | Payer: MEDICARE

## 2018-03-15 DIAGNOSIS — Z9889 Other specified postprocedural states: Secondary | ICD-10-CM

## 2018-03-20 ENCOUNTER — Encounter: Admit: 2018-03-20 | Discharge: 2018-03-20 | Payer: MEDICARE

## 2018-03-20 ENCOUNTER — Ambulatory Visit: Admit: 2018-03-20 | Discharge: 2018-03-20 | Payer: MEDICARE

## 2018-03-20 DIAGNOSIS — I10 Essential (primary) hypertension: Secondary | ICD-10-CM

## 2018-03-20 DIAGNOSIS — Z9221 Personal history of antineoplastic chemotherapy: Secondary | ICD-10-CM

## 2018-03-20 DIAGNOSIS — S62109A Fracture of unspecified carpal bone, unspecified wrist, initial encounter for closed fracture: Secondary | ICD-10-CM

## 2018-03-20 DIAGNOSIS — S92902A Unspecified fracture of left foot, initial encounter for closed fracture: Secondary | ICD-10-CM

## 2018-03-20 DIAGNOSIS — G629 Polyneuropathy, unspecified: Secondary | ICD-10-CM

## 2018-03-20 DIAGNOSIS — M1990 Unspecified osteoarthritis, unspecified site: Secondary | ICD-10-CM

## 2018-03-20 DIAGNOSIS — G35 Multiple sclerosis: Secondary | ICD-10-CM

## 2018-03-20 DIAGNOSIS — C801 Malignant (primary) neoplasm, unspecified: Secondary | ICD-10-CM

## 2018-03-20 DIAGNOSIS — M549 Dorsalgia, unspecified: Secondary | ICD-10-CM

## 2018-03-20 DIAGNOSIS — Z9889 Other specified postprocedural states: Secondary | ICD-10-CM

## 2018-03-20 DIAGNOSIS — E785 Hyperlipidemia, unspecified: Secondary | ICD-10-CM

## 2018-03-20 DIAGNOSIS — K219 Gastro-esophageal reflux disease without esophagitis: Secondary | ICD-10-CM

## 2018-04-11 ENCOUNTER — Encounter: Admit: 2018-04-11 | Discharge: 2018-04-11 | Payer: MEDICARE

## 2018-04-11 DIAGNOSIS — Z9889 Other specified postprocedural states: Principal | ICD-10-CM

## 2018-04-17 ENCOUNTER — Ambulatory Visit: Admit: 2018-04-17 | Discharge: 2018-04-17 | Payer: MEDICARE

## 2018-04-17 ENCOUNTER — Encounter: Admit: 2018-04-17 | Discharge: 2018-04-17 | Payer: MEDICARE

## 2018-04-17 DIAGNOSIS — E785 Hyperlipidemia, unspecified: ICD-10-CM

## 2018-04-17 DIAGNOSIS — Q661 Congenital talipes calcaneovarus, unspecified foot: ICD-10-CM

## 2018-04-17 DIAGNOSIS — Z9889 Other specified postprocedural states: Principal | ICD-10-CM

## 2018-04-17 DIAGNOSIS — S62109A Fracture of unspecified carpal bone, unspecified wrist, initial encounter for closed fracture: ICD-10-CM

## 2018-04-17 DIAGNOSIS — G35 Multiple sclerosis: ICD-10-CM

## 2018-04-17 DIAGNOSIS — S93325D Dislocation of tarsometatarsal joint of left foot, subsequent encounter: ICD-10-CM

## 2018-04-17 DIAGNOSIS — M1990 Unspecified osteoarthritis, unspecified site: Principal | ICD-10-CM

## 2018-04-17 DIAGNOSIS — Z9221 Personal history of antineoplastic chemotherapy: ICD-10-CM

## 2018-04-17 DIAGNOSIS — M549 Dorsalgia, unspecified: ICD-10-CM

## 2018-04-17 DIAGNOSIS — C801 Malignant (primary) neoplasm, unspecified: ICD-10-CM

## 2018-04-17 DIAGNOSIS — S92902A Unspecified fracture of left foot, initial encounter for closed fracture: ICD-10-CM

## 2018-04-17 DIAGNOSIS — G629 Polyneuropathy, unspecified: ICD-10-CM

## 2018-04-17 DIAGNOSIS — K219 Gastro-esophageal reflux disease without esophagitis: ICD-10-CM

## 2018-04-17 DIAGNOSIS — I10 Essential (primary) hypertension: ICD-10-CM

## 2018-04-23 ENCOUNTER — Encounter: Admit: 2018-04-23 | Discharge: 2018-04-23 | Payer: MEDICARE

## 2018-04-23 DIAGNOSIS — G35 Multiple sclerosis: ICD-10-CM

## 2018-04-23 DIAGNOSIS — Z171 Estrogen receptor negative status [ER-]: ICD-10-CM

## 2018-04-23 DIAGNOSIS — S92902A Unspecified fracture of left foot, initial encounter for closed fracture: ICD-10-CM

## 2018-04-23 DIAGNOSIS — Z923 Personal history of irradiation: ICD-10-CM

## 2018-04-23 DIAGNOSIS — K219 Gastro-esophageal reflux disease without esophagitis: ICD-10-CM

## 2018-04-23 DIAGNOSIS — E785 Hyperlipidemia, unspecified: ICD-10-CM

## 2018-04-23 DIAGNOSIS — I10 Essential (primary) hypertension: ICD-10-CM

## 2018-04-23 DIAGNOSIS — Z9221 Personal history of antineoplastic chemotherapy: ICD-10-CM

## 2018-04-23 DIAGNOSIS — G629 Polyneuropathy, unspecified: ICD-10-CM

## 2018-04-23 DIAGNOSIS — C50112 Malignant neoplasm of central portion of left female breast: Principal | ICD-10-CM

## 2018-04-23 DIAGNOSIS — M549 Dorsalgia, unspecified: ICD-10-CM

## 2018-04-23 DIAGNOSIS — M1990 Unspecified osteoarthritis, unspecified site: Principal | ICD-10-CM

## 2018-04-23 DIAGNOSIS — C801 Malignant (primary) neoplasm, unspecified: ICD-10-CM

## 2018-04-23 DIAGNOSIS — S62109A Fracture of unspecified carpal bone, unspecified wrist, initial encounter for closed fracture: ICD-10-CM

## 2018-04-23 LAB — CBC AND DIFF
Lab: 0 10*3/uL (ref 0–0.20)
Lab: 0.1 10*3/uL (ref 0–0.45)
Lab: 0.5 10*3/uL (ref 0–0.80)
Lab: 1 % (ref 0–2)
Lab: 1.9 10*3/uL (ref 1.0–4.8)
Lab: 102 FL — ABNORMAL HIGH (ref 80–100)
Lab: 15 % — ABNORMAL HIGH (ref 11–15)
Lab: 15 g/dL — ABNORMAL HIGH (ref 12.0–15.0)
Lab: 2 % (ref 0–5)
Lab: 2.9 10*3/uL (ref 1.8–7.0)
Lab: 233 10*3/uL (ref 150–400)
Lab: 33 g/dL (ref 32.0–36.0)
Lab: 34 pg (ref 26–34)
Lab: 35 % (ref 24–44)
Lab: 4.4 M/UL (ref 4.0–5.0)
Lab: 45 % — ABNORMAL HIGH (ref 36–45)
Lab: 5.4 10*3/uL (ref 4.5–11.0)
Lab: 53 % (ref 41–77)
Lab: 6.5 FL — ABNORMAL LOW (ref 7–11)
Lab: 9 % (ref 4–12)

## 2018-04-23 NOTE — Progress Notes
Psychiatric:         Behavior: Behavior normal.         Thought Content: Thought content normal.         Judgment: Judgment normal.               Assessment and Plan:  1. Left breast IDC, triple negative. 2.5 cm on clinical exam, cT2cN0 stage IIA disease.  MRI showed 1.6 cm area, likely T1c disease.  2. Completed neoadjuvant chemotherapy with dd AC and weekly taxol.  Pathological CR seen at the time of surgery.  3. Completed radiation with Dr. Janee Morn 02/09/16.  4.  Lymphedema of the breast, well controlled.  No acute issues.  5.  Neuropathy improved, not back to baseline  On gabapentin, can taper off once symptoms improve.  6.  Anemia.  Now has normalized.  Hemoglobin normal.  We will check her iron studies today.  Further oral iron use based on the results.    7.  She has had B12 levels checked in the past due to macrocytosis and have been variable.  Most recent levels low at 118.  On oral supplementation currently.  Recheck levels today.  8.  Multiple sclerosis, long-standing history.  Follows with Dr. Aileen Pilot.    Return to clinic 6 mos or sooner as needed.    Parts of this note were created with voice recognition software. Please excuse any grammatical or typographical errors.

## 2018-04-24 ENCOUNTER — Encounter: Admit: 2018-04-24 | Discharge: 2018-04-24 | Payer: MEDICARE

## 2018-04-24 DIAGNOSIS — C50112 Malignant neoplasm of central portion of left female breast: Principal | ICD-10-CM

## 2018-04-24 LAB — IRON + BINDING CAPACITY + %SAT+ FERRITIN
Lab: 155 ug/dL (ref 50–160)
Lab: 450 ug/dL — ABNORMAL HIGH (ref 270–380)
Lab: 49 ng/mL (ref 10–200)

## 2018-04-24 LAB — VITAMIN B12: Lab: 192 pg/mL — ABNORMAL HIGH (ref 180–914)

## 2018-04-27 ENCOUNTER — Ambulatory Visit: Admit: 2018-04-27 | Discharge: 2018-04-28 | Payer: MEDICARE

## 2018-04-28 DIAGNOSIS — Q661 Congenital talipes calcaneovarus, unspecified foot: ICD-10-CM

## 2018-04-28 DIAGNOSIS — M79672 Pain in left foot: Principal | ICD-10-CM

## 2018-04-28 DIAGNOSIS — Q6612 Congenital talipes calcaneovarus, left foot: ICD-10-CM

## 2018-05-01 ENCOUNTER — Ambulatory Visit: Admit: 2018-05-01 | Discharge: 2018-05-02 | Payer: MEDICARE

## 2018-05-02 DIAGNOSIS — Q661 Congenital talipes calcaneovarus, unspecified foot: ICD-10-CM

## 2018-05-02 DIAGNOSIS — M79672 Pain in left foot: Principal | ICD-10-CM

## 2018-05-03 ENCOUNTER — Ambulatory Visit: Admit: 2018-05-03 | Discharge: 2018-05-04 | Payer: MEDICARE

## 2018-05-04 DIAGNOSIS — Q661 Congenital talipes calcaneovarus, unspecified foot: ICD-10-CM

## 2018-05-04 DIAGNOSIS — M79672 Pain in left foot: Principal | ICD-10-CM

## 2018-05-04 DIAGNOSIS — Q6612 Congenital talipes calcaneovarus, left foot: ICD-10-CM

## 2018-05-09 ENCOUNTER — Ambulatory Visit: Admit: 2018-05-08 | Discharge: 2018-05-09 | Payer: MEDICARE

## 2018-05-09 DIAGNOSIS — Q661 Congenital talipes calcaneovarus, unspecified foot: ICD-10-CM

## 2018-05-09 DIAGNOSIS — M79672 Pain in left foot: Principal | ICD-10-CM

## 2018-05-10 ENCOUNTER — Encounter: Admit: 2018-05-10 | Discharge: 2018-05-10 | Payer: MEDICARE

## 2018-05-10 ENCOUNTER — Ambulatory Visit: Admit: 2018-05-10 | Discharge: 2018-05-11 | Payer: MEDICARE

## 2018-05-11 DIAGNOSIS — Q661 Congenital talipes calcaneovarus, unspecified foot: ICD-10-CM

## 2018-05-11 DIAGNOSIS — Q6612 Congenital talipes calcaneovarus, left foot: ICD-10-CM

## 2018-05-11 DIAGNOSIS — M79672 Pain in left foot: Principal | ICD-10-CM

## 2018-05-15 ENCOUNTER — Encounter: Admit: 2018-05-15 | Discharge: 2018-05-15 | Payer: MEDICARE

## 2018-05-15 ENCOUNTER — Ambulatory Visit: Admit: 2018-05-15 | Discharge: 2018-05-16 | Payer: MEDICARE

## 2018-05-16 DIAGNOSIS — Q6612 Congenital talipes calcaneovarus, left foot: ICD-10-CM

## 2018-05-16 DIAGNOSIS — M79672 Pain in left foot: Principal | ICD-10-CM

## 2018-05-16 DIAGNOSIS — Q661 Congenital talipes calcaneovarus, unspecified foot: ICD-10-CM

## 2018-05-21 ENCOUNTER — Encounter: Admit: 2018-05-21 | Discharge: 2018-05-21 | Payer: MEDICARE

## 2018-05-22 ENCOUNTER — Ambulatory Visit: Admit: 2018-05-22 | Discharge: 2018-05-22 | Payer: MEDICARE

## 2018-05-22 DIAGNOSIS — M79672 Pain in left foot: Principal | ICD-10-CM

## 2018-05-22 DIAGNOSIS — Q661 Congenital talipes calcaneovarus, unspecified foot: ICD-10-CM

## 2018-05-24 ENCOUNTER — Encounter: Admit: 2018-05-24 | Discharge: 2018-05-24 | Payer: MEDICARE

## 2018-05-29 ENCOUNTER — Ambulatory Visit: Admit: 2018-05-29 | Discharge: 2018-05-29 | Payer: MEDICARE

## 2018-05-29 DIAGNOSIS — M79672 Pain in left foot: Principal | ICD-10-CM

## 2018-05-29 DIAGNOSIS — Q6612 Congenital talipes calcaneovarus, left foot: ICD-10-CM

## 2018-06-04 ENCOUNTER — Encounter: Admit: 2018-06-04 | Discharge: 2018-06-04 | Payer: MEDICARE

## 2018-06-05 ENCOUNTER — Encounter: Admit: 2018-06-05 | Discharge: 2018-06-05 | Payer: MEDICARE

## 2018-06-05 ENCOUNTER — Ambulatory Visit: Admit: 2018-06-05 | Discharge: 2018-06-05 | Payer: MEDICARE

## 2018-06-05 DIAGNOSIS — Q661 Congenital talipes calcaneovarus, unspecified foot: ICD-10-CM

## 2018-06-05 DIAGNOSIS — Q6612 Congenital talipes calcaneovarus, left foot: Secondary | ICD-10-CM

## 2018-06-05 DIAGNOSIS — M79672 Pain in left foot: Principal | ICD-10-CM

## 2018-06-12 ENCOUNTER — Ambulatory Visit: Admit: 2018-06-12 | Discharge: 2018-06-12 | Payer: MEDICARE

## 2018-06-12 ENCOUNTER — Encounter: Admit: 2018-06-12 | Discharge: 2018-06-12 | Payer: MEDICARE

## 2018-06-12 DIAGNOSIS — Q661 Congenital talipes calcaneovarus, unspecified foot: Secondary | ICD-10-CM

## 2018-06-12 DIAGNOSIS — Q6612 Congenital talipes calcaneovarus, left foot: ICD-10-CM

## 2018-06-12 DIAGNOSIS — M79672 Pain in left foot: Principal | ICD-10-CM

## 2018-06-19 ENCOUNTER — Ambulatory Visit: Admit: 2018-06-19 | Discharge: 2018-06-20 | Payer: MEDICARE

## 2018-06-20 DIAGNOSIS — M79672 Pain in left foot: Principal | ICD-10-CM

## 2018-06-21 ENCOUNTER — Encounter: Admit: 2018-06-21 | Discharge: 2018-06-21 | Payer: MEDICARE

## 2018-06-21 DIAGNOSIS — Z9889 Other specified postprocedural states: Principal | ICD-10-CM

## 2018-06-26 ENCOUNTER — Ambulatory Visit: Admit: 2018-06-26 | Discharge: 2018-06-26 | Payer: MEDICARE

## 2018-06-26 DIAGNOSIS — M79672 Pain in left foot: Principal | ICD-10-CM

## 2018-06-26 DIAGNOSIS — Q661 Congenital talipes calcaneovarus, unspecified foot: ICD-10-CM

## 2018-06-26 DIAGNOSIS — Q6612 Congenital talipes calcaneovarus, left foot: ICD-10-CM

## 2018-07-02 ENCOUNTER — Ambulatory Visit: Admit: 2018-07-02 | Discharge: 2018-07-02 | Payer: MEDICARE

## 2018-07-02 ENCOUNTER — Encounter: Admit: 2018-07-02 | Discharge: 2018-07-02 | Payer: MEDICARE

## 2018-07-02 DIAGNOSIS — Z9889 Other specified postprocedural states: Principal | ICD-10-CM

## 2018-07-03 ENCOUNTER — Encounter: Admit: 2018-07-03 | Discharge: 2018-07-03 | Payer: MEDICARE

## 2018-09-03 ENCOUNTER — Encounter: Admit: 2018-09-03 | Discharge: 2018-09-03

## 2018-09-03 DIAGNOSIS — Z9889 Other specified postprocedural states: Secondary | ICD-10-CM

## 2018-09-04 ENCOUNTER — Ambulatory Visit: Admit: 2018-09-04 | Discharge: 2018-09-04

## 2018-09-04 ENCOUNTER — Encounter: Admit: 2018-09-04 | Discharge: 2018-09-04

## 2018-09-04 DIAGNOSIS — K219 Gastro-esophageal reflux disease without esophagitis: Secondary | ICD-10-CM

## 2018-09-04 DIAGNOSIS — Z9221 Personal history of antineoplastic chemotherapy: Secondary | ICD-10-CM

## 2018-09-04 DIAGNOSIS — C801 Malignant (primary) neoplasm, unspecified: Secondary | ICD-10-CM

## 2018-09-04 DIAGNOSIS — I10 Essential (primary) hypertension: Secondary | ICD-10-CM

## 2018-09-04 DIAGNOSIS — S62109A Fracture of unspecified carpal bone, unspecified wrist, initial encounter for closed fracture: Secondary | ICD-10-CM

## 2018-09-04 DIAGNOSIS — G35 Multiple sclerosis: Secondary | ICD-10-CM

## 2018-09-04 DIAGNOSIS — Z9889 Other specified postprocedural states: Secondary | ICD-10-CM

## 2018-09-04 DIAGNOSIS — M79672 Pain in left foot: Secondary | ICD-10-CM

## 2018-09-04 DIAGNOSIS — M549 Dorsalgia, unspecified: Secondary | ICD-10-CM

## 2018-09-04 DIAGNOSIS — E785 Hyperlipidemia, unspecified: Secondary | ICD-10-CM

## 2018-09-04 DIAGNOSIS — G629 Polyneuropathy, unspecified: Secondary | ICD-10-CM

## 2018-09-04 DIAGNOSIS — M1990 Unspecified osteoarthritis, unspecified site: Secondary | ICD-10-CM

## 2018-09-04 DIAGNOSIS — S92902A Unspecified fracture of left foot, initial encounter for closed fracture: Secondary | ICD-10-CM

## 2018-09-04 NOTE — Patient Instructions
Please do not hesitate to contact my office with any questions.    Dr. Bryan Vopat & Stephanie Caldwell PA-C - Orthopedic Surgeon, Sports Medicine  [facility] - Phone [phone] - Fax [phone]   [address], [address] - [place], [place] [address]    Kara Schuessler BSN, RN - Clinical Nurse Coordinator  Casey Conover BSN, RN - Clinical Nurse Coordinator    MS, ATC, LAT - Clinical Athletic Trainer    Thank you for supporting our practice! Your feedback helps us deliver the highest quality of care.  Review us at: [email or web address]

## 2018-09-06 NOTE — Progress Notes
Rachael Black, Rachael Black  MRN #[id number]    September 04, 2018    HISTORY OF PRESENT ILLNESS:   She is status post left first through third TMT fusion, gastroc release and calcaneal osteotomy on 03/27/2018.  She occasionally has pain over her foot, however it is occasional.  Currently she has none.  She also has pain when with rubbing on her heel.  Overall she says she is 75% better.  Unfortunately her husband just got knocked off a horse.    PHYSICAL EXAMINATION:   On physical examination she has normal alignment of the hips and knees.  She has some slight pes planus deformity in the left greater than right.    Left lower extremity ??? Incisions are well healed.  She has tenderness to palpation over the heel.  She has normal dorsiflexion.  She has 5/5 strength dorsiflexion, plantarflexion and eversion of the foot.      IMAGING:    X-ray demonstrates a healed first, second and third TMT fusion.  There is one broken staple in there.  In the calcaneal osteotomy the screws appear to be slightly healed, however it appears to be healed.    ASSESSMENT AND PLAN:   Status post surgery above.  The patient is going to continue activity as tolerated.  If it bothers her more in the future we can remove one of the screws. She agreed with this plan.  She will follow-up as needed.         BV/abc:kh Abran Duke, MD      Ht 165.1 cm (65)  - Wt 83 kg (183 lb)  - LMP  (LMP Unknown)  - BMI 30.45 kg/m???     No orders of the defined types were placed in this encounter.      Current Medications:  ??? acetaminophen SR(+) (TYLENOL) 650 mg tablet Take 1,950 mg by mouth twice daily.   ??? cholecalciferol (VITAMIN D-3) 125 mcg (5,000 unit) tablet Take 5,000 Units by mouth daily.   ??? ferrous sulfate 325 mg (65 mg iron) tablet Take one tablet by mouth daily. Take on an empty stomach with orange juice   ??? gabapentin (NEURONTIN) 300 mg capsule Take 3 caps twice a day.  Indications: Neuropathic Pain ??? loratadine (CLARITIN) 10 mg tablet Take 1 Tab by mouth every morning.   ??? MAGNESIUM PO Take  by mouth.   ??? pantoprazole DR (PROTONIX) 20 mg tablet Take 20 mg by mouth daily.     Allergies   Allergen Reactions   ??? Sulfa (Sulfonamide Antibiotics) HIVES and SHORTNESS OF BREATH         General Physical Exam:  General/Constitutional:No apparent distress: well-nourished and well developed.  Eyes: Sclera nonicteric, conjunctiva clear  Respiratory:No shortness of breath or dyspnea  Cardiac: No clubbing, cyanosis, or edema  Vascular: No edema, swelling or tenderness, except as noted in detailed exam.  Integumentary:No impressive skin lesions present, except as noted in detailed exam.  Neuro/Psych: Normal mood and affect, oriented to person, place and time.  Musculoskeletal: Normal, except as noted in detailed exam and in HPI.    ATTESTATION    I personally performed the E/M including history, physical exam, and MDM.    Staff name:  Abran Duke, MD Date:  09/06/2018          Abran Duke, MD    Portions of this noted may have been created using Dragon, a voice recognition software.  Please contact my office for any clarification of documentation.  Please send a copy of office notes to the primary care physician and referring providers.

## 2018-11-02 ENCOUNTER — Encounter: Admit: 2018-11-02 | Discharge: 2018-11-02

## 2018-11-02 DIAGNOSIS — G629 Polyneuropathy, unspecified: Secondary | ICD-10-CM

## 2018-11-02 DIAGNOSIS — G35 Multiple sclerosis: Secondary | ICD-10-CM

## 2018-11-02 DIAGNOSIS — C801 Malignant (primary) neoplasm, unspecified: Secondary | ICD-10-CM

## 2018-11-02 DIAGNOSIS — S62109A Fracture of unspecified carpal bone, unspecified wrist, initial encounter for closed fracture: Secondary | ICD-10-CM

## 2018-11-02 DIAGNOSIS — E785 Hyperlipidemia, unspecified: Secondary | ICD-10-CM

## 2018-11-02 DIAGNOSIS — M549 Dorsalgia, unspecified: Secondary | ICD-10-CM

## 2018-11-02 DIAGNOSIS — I10 Essential (primary) hypertension: Secondary | ICD-10-CM

## 2018-11-02 DIAGNOSIS — Z171 Estrogen receptor negative status [ER-]: Secondary | ICD-10-CM

## 2018-11-02 DIAGNOSIS — M1990 Unspecified osteoarthritis, unspecified site: Secondary | ICD-10-CM

## 2018-11-02 DIAGNOSIS — K219 Gastro-esophageal reflux disease without esophagitis: Secondary | ICD-10-CM

## 2018-11-02 DIAGNOSIS — S92902A Unspecified fracture of left foot, initial encounter for closed fracture: Secondary | ICD-10-CM

## 2018-11-02 DIAGNOSIS — C50112 Malignant neoplasm of central portion of left female breast: Secondary | ICD-10-CM

## 2018-11-02 DIAGNOSIS — Z9221 Personal history of antineoplastic chemotherapy: Secondary | ICD-10-CM

## 2018-11-02 LAB — CBC AND DIFF
Lab: 100 FL (ref 80–100)
Lab: 13 % — ABNORMAL LOW (ref 11–15)
Lab: 14 g/dL — ABNORMAL LOW (ref 12.0–15.0)
Lab: 33 g/dL (ref 32.0–36.0)
Lab: 33 pg (ref 26–34)
Lab: 4.3 M/UL — ABNORMAL LOW (ref 4.0–5.0)
Lab: 43 % — ABNORMAL HIGH (ref 36–45)
Lab: 6.5 FL — ABNORMAL LOW (ref 7–11)
Lab: 7.5 10*3/uL — ABNORMAL LOW (ref 4.5–11.0)

## 2018-11-02 LAB — VITAMIN B12: Lab: 255 pg/mL — ABNORMAL LOW (ref 180–914)

## 2018-11-02 NOTE — Progress Notes
Date of Service: 11/02/2018      Subjective:             Reason for Visit:  Heme/Onc Care      Rachael Black is a 73 y.o. female.      History of Present Illness  PRIMARY PROVIDER: Dr. Merian Capron  SURGEON: Dr. 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  She had her regular screening mammogram in November 2015. She palpated a mass in the left breast that led to diagnostic imaging march 2017. 2 cm mass was found on imaging studies at 12:00 position for which she underwent stereotactic biopsy.  Pathology was consistent with an invasive ductal carcinoma, triple negative, grade 3 with Ki-67 of 68 percent.  Denies pain, nipple discharge, skin changes. She had excisional biopsy on right outer lower quadrant for a cyst long time back.   Patient could not participate in clinical trial due to underlying history of multiple sclerosis.  She received neoadjuvant chemotherapy with dose dense AC and weekly Taxol.  Could not receive 12 cycles of  Taxol due to neuropathy.  She underwent lumpectomy and sentinel lymph node biopsy with Dr. [name] 12/14/2015, pathology showed pathological CR.  Lymph nodes were not involved.   Due to palpable mass/cyst, mammogram and [place] were done Feb 2018, cyst was found and aspirated.    Patient was admitted May 2019 due to anemia. ???Suspicion for GI bleed. ???heme occult positive. ???Colonoscopy showed rectal polyp, biopsies were negative for H. pylori and celiac disease.   No evidence of hemolysis.   Iron study showed severe iron deficiency. ???3 doses of IV Venofer, started on oral iron supplementation.   No plans for further endoscopy unless anemia does not improve per GI.   Also noted B12 deficiency. ???With level of 118. ???Started on IM B 12 injection.   Vit D low at 20.1    TREATMENT HISTORY: Dose dense AC 07/20/15 - 08/31/15  Weekly Taxol 09/14/15 - 11/23/15.  12th cycle not given due to neuropathy  Completed radiation 02/09/2016    INTERVAL HISTORY: Patient is here for follow-up.  Unaccompanied.  Her husband was waiting in the car.  Neuropathy overall at her baseline.  Multiple sclerosis has not been causing any significant problems.  Denies any current complaints or concerns.             Review of Systems   Constitutional: Negative for activity change, appetite change, chills, diaphoresis, fatigue and fever.   HENT: Negative.    Eyes: Negative.    Respiratory: Negative for cough and shortness of breath.    Cardiovascular: Negative for chest pain and palpitations.   Gastrointestinal: Negative for abdominal pain, blood in stool, constipation, diarrhea, nausea and vomiting.   Genitourinary: Negative.    Musculoskeletal: Negative for arthralgias.   Skin: Negative for rash.   Neurological: Positive for numbness. Negative for dizziness, weakness, light-headedness and headaches.   Hematological: Negative.    Psychiatric/Behavioral: Negative.        PAST MEDICAL HISTORY: Multiple sclerosis, GERD  FAMILY HISTORY: Reviewed and non contributory. No history of breast, ovarian, endometrial cancer  OB/GYN HISTORY:  Age at first Menarche:??? 11  Age at Menopause:??? 41, took HRT x 2 years  Age at First Live Birth:??? 14  Gravida:??? 2  Para:  2  Breastfeeding:??? No  SOCIAL HISTORY: Non smoker. No alcohol or drug use    Objective:         ??? acetaminophen SR(+) (TYLENOL) 650 mg tablet Take 1,950 mg by mouth twice daily.   ??? cholecalciferol (VITAMIN D-3) 125 mcg (5,000 unit) tablet Take 5,000 Units by mouth daily.   ??? ferrous sulfate 325 mg (65 mg iron) tablet Take one tablet by mouth daily. Take on an empty stomach with orange juice   ??? gabapentin (NEURONTIN) 300 mg capsule Take 3 caps twice a day.  Indications: Neuropathic Pain   ??? loratadine (CLARITIN) 10 mg tablet Take 1 Tab by mouth every morning. ??? MAGNESIUM PO Take  by mouth.   ??? pantoprazole DR (PROTONIX) 20 mg tablet Take 20 mg by mouth daily.     Vitals:    11/02/18 1127   BP: (!) 142/81   BP Source: Arm, Left Upper   Patient Position: Sitting   Pulse: 85   Resp: 18   Temp: 36.2 ???C (97.2 ???F)   SpO2: 98%   Weight: 82.1 kg (181 lb)   Height: 165.1 cm (65)   PainSc: Zero     Body mass index is 30.12 kg/m???.     Pain Score: Zero(F-2/3)         Pain Addressed:  N/A    Patient Evaluated for a Clinical Trial: Patient currently in screening for a treatment clinical trial.     [place] Cooperative Oncology Group performance status is 0, Fully active, able to carry on all pre-disease performance without restriction.Marland Kitchen     Physical Exam  Constitutional:       General: She is not in acute distress.     Appearance: She is well-developed.   HENT:      Mouth/Throat:      Pharynx: No oropharyngeal exudate.   Eyes:      Conjunctiva/sclera: Conjunctivae normal.      Comments: Left eye ptosis    Neck:      Musculoskeletal: Neck supple.   Cardiovascular:      Rate and Rhythm: Regular rhythm.      Heart sounds: Normal heart sounds. No murmur.      Comments:     Pulmonary:      Effort: Pulmonary effort is normal.      Breath sounds: Normal breath sounds. No wheezing.   Chest:       Abdominal:      Palpations: There is no mass.      Tenderness: There is no abdominal tenderness.   Musculoskeletal: Normal range of motion.   Lymphadenopathy:      Cervical: No cervical adenopathy.   Skin:     Findings: No rash.   Neurological:      Mental Status: She is alert and oriented to person, place, and time.   Psychiatric:         Behavior: Behavior normal.         Thought Content: Thought content normal.         Judgment: Judgment normal.               Assessment and Plan:  1. Left breast IDC, triple negative. 2.5 cm on clinical exam, cT2cN0 stage IIA disease.  MRI showed 1.6 cm area, likely T1c disease.  2. Completed neoadjuvant chemotherapy with dd AC and weekly taxol. Pathological CR seen at the time of surgery.  3. Completed radiation with Dr. Janee Morn 02/09/16.  Scheduled for mammogram October 2020.  4.  Lymphedema of the breast, cute issues.  Exam unremarkable today.    5.  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  6.  Anemia.  Resolved and she had one episode of erythrocytosis.  Labs today showed normal hemoglobin.  7.  She has had B12 levels checked in the past due to macrocytosis and have been variable.  Most recent levels low at 118.  On oral supplementation currently.  Recheck levels today.  8.  Multiple sclerosis, long-standing history.  Follows with Dr. Aileen Pilot.    Return to clinic 12 mos or sooner as needed.    Parts of this note were created with voice recognition software. Please excuse any grammatical or typographical errors.

## 2018-12-05 NOTE — Progress Notes
Bioimpedance Spectroscopy performed.  Advised patient that Normal result will be sent within 24 hours by mail or via Mychart (preferred).  The patient will be contacted via phone by the lymphedema nurse with any abnormal results.

## 2018-12-05 NOTE — Progress Notes
Reviewed BIS testing results from today  Results L-Dex:3.6 LEFT  Baseline-5.1  Change from Baseline:8.7      WNL less than 3 standard deviation increase from baseline.      Notified patient viaMyChart result was normal and to continue with routine follow up as scheduled.  Provided clinic contact information for any questions or concerns.

## 2018-12-05 NOTE — Progress Notes
Name: Rachael Black          MRN: [id number]      DOB: [birth year]      AGE: 73 y.o.   DATE OF SERVICE: 12/05/2018    Subjective:             Reason for Visit:  Heme/Onc Care      Rachael Black is a 73 y.o. female.     Cancer Staging  Malignant neoplasm of central portion of left female breast [facility])  Staging form: Breast, AJCC 7th Edition  - Clinical stage from 06/30/2015: Stage IA (T1c, N0, M0) - Signed by Judye Bos, PA-C on 06/30/2015  - Pathologic stage from 12/21/2015: yT0, N0, cM0 - Signed by Judye Bos, PA-C on 12/21/2015    DIAGNOSIS:  Left grade 3 IDC (ER 0%, PR 0%, HER2 1+, Ki-67 68%) at 12:00, dx 05/2015     History of Present Illness    Ms. Hindi returns to the clinic today for routine follow up.  She is 3.5 years out from diagnosis.  She is doing well and has no breast or systemic concerns.      HISTORY:  Ms. Prechtel is a Caucasian female who presented to the [facility] on 06/23/2015 at age 3 for evaluation of left breast lump. Ms. Calabro felt a lump in the middle of April 2017. She feels it is about the size of a quarter. She denies any nipple discharge, skin change or pain. She has not had imaging since 2015.  Ultrasound guided biopsy on 06/25/15 revealed grade 3 IDC.  Ms. Zasada completed neoadjuvant ddAC + Taxol on 11/30/15.  Ms. Yost underwent left RSL lumpectomy/SLNB on 12/14/2015.  Final pathology revealed a complete pathologic response with 0/4 lymph nodes.  She completed radiation therapy with Dr. Janee Morn on 02/09/16.      BREAST IMAGING:  Mammogram:    -- Bilateral screening mammogram 01/02/14 [facility]) revealed scattered fibroglandular densities. There was no suspicious mass or architectural distortion.  -- Bilateral diagnostic mammogram 06/23/15 ([place]) revealed scattered fibroglandular densities. There was a dense, irregular, 2 cm mass at the patient's area of palpable concern in the upper left breast, 12:00 position, anterior to middle depth. No new suspicious abnormality was seen within the right breast on mammogram.  Ultrasound:  -- Targeted left breast ultrasound 06/23/15 ([place]) revealed at 12:00, 5 cm from the nipple, demonstrated a 2.1 x 1.6 x 1.9 cm irregular, not parallel, hypoechoic mass with angular margins and internal blood flow. There may be intraductal extension from this mass towards the nipple. No suspicious left axillary or left parasternal lymph nodes were identified.  MRI:  -- Bilateral breast MRI 07/23/15 ([place]) revealed no area of suspicious enhancement in the right breast. No adenopathy, skin or nipple abnormalities were identified. Within the left breast superiorly at approximately 12:00 there was the known malignancy measuring 1.6 cm. 3 cm posterior and inferior to the mass within the middle depth central breast slightly medial, there was a 4 mm circumscribed enhancing mass which was stable on multiple prior mammograms dating back with June 2012. No skin or nipple abnormalities were present. No axillary abnormalities were present.    REPRODUCTIVE HEALTH:  Age at first Menarche:  76  Age at First Live Birth:  23  Age at Menopause:  88, took HRT x 2 years  Gravida:  2  Para: 2  Breastfeeding:  No    PROCEDURE:   1.  Right breast cyst excision,  1990s  2.  Left RSL lumpectomy/SLNB, 12/14/2015 [name])  PERTINENT PMH: Multiple sclerosis, HTN, HLD, GERD   FAMILY HISTORY:  No family history of breast cancer  PHYSICAL EXAM on PRESENTATION:  Left - 2.5 cm lump at 12:00, 5 cm FTN. No skin or nipple changes. Right - No palpable breast masses. No skin, nipple, or areolar change. No supraclavicular or axillary adenopathy.    MEDICAL ONCOLOGY: Dr. Stasia Cavalier NEOADJUVANT THERAPY:  Neoadjuvant ddAC + Taxol 07/20/15 - 11/23/15 PRESENT THERAPY: Observation  REFERRED BY:  Dr. Merian Capron       Review of Systems    Constitutional: Negative for fever, chills, appetite change and fatigue. HENT: Negative for hearing loss, congestion, rhinorrhea and tinnitus.    Eyes: Negative for pain, discharge and itching.   Respiratory: Negative for cough, chest tightness and shortness of breath.    Cardiovascular: Negative for chest pain and palpitations.   Gastrointestinal: Negative for abdominal distention, pain, nausea, vomiting, and diarrhea.   Genitourinary: Negative for frequency, vaginal bleeding, difficulty urinating and pelvic pain.   Musculoskeletal: Negative for myalgias, back pain, joint swelling and arthralgias.   Skin: Negative for rash.   Neurological: Negative for dizziness, weakness, light-headedness and headaches.   Hematological: Does not bruise/bleed easily.   Psychiatric/Behavioral: Negative for disturbed wake/sleep cycle. The patient is not nervous/anxious.    The following medical/surgical/family/social history and the list of medications are current, as of 12/05/2018    Medical History:   Diagnosis Date   ? Arthritis    ? Back pain    ? Cancer (HCC) 06/2015    Breast    ? Foot fracture, left 09/26/2016   ? GERD (gastroesophageal reflux disease)    ? History of chemotherapy     neoadjuvant chemotherapy 2017   ? HLD (hyperlipidemia)    ? Hypertension    ? Multiple sclerosis (HCC) 1990   ? Neuropathy    ? Wrist fracture 09/26/2016    heads of ulna and radius     Surgical History:   Procedure Laterality Date   ? HX RHINOPLASTY  1974   ? HX TUBAL LIGATION  1976   ? ANKLE SURGERY Right 2000    with instrumentation   ? Left radioactive seed localized lumpectomy, left axillary sentinel lymph node biopsy  Left 12/14/2015    Performed by Cordelia Poche, MD at [facility]   ? COLONOSCOPY     ? FOOT SURGERY       Family History   Problem Relation Age of Onset   ? Arthritis-osteo Mother    ? Thyroid Disease Mother    ? Heart Disease Father    ? High Cholesterol Father    ? Cancer Maternal Aunt    ? Cancer Maternal Uncle    ? Cancer Paternal Uncle    ? Cancer-Colon Maternal Grandmother ? Stroke Maternal Grandmother    ? Cancer-Colon Paternal Grandmother      Social History     Socioeconomic History   ? Marital status: Married     Spouse name: Not on file   ? Number of children: Not on file   ? Years of education: Not on file   ? Highest education level: Not on file   Occupational History   ? Not on file   Tobacco Use   ? Smoking status: Former Smoker     Packs/day: 1.00     Years: 10.00     Pack years: 10.00     Types: Cigarettes  Last attempt to quit: 02/28/2005     Years since quitting: 13.7   ? Smokeless tobacco: Never Used   Substance and Sexual Activity   ? Alcohol use: Yes     Alcohol/week: 8.0 standard drinks     Types: 8 Standard drinks or equivalent per week   ? Drug use: No   ? Sexual activity: Not on file   Other Topics Concern   ? Not on file   Social History Narrative   ? Not on file       Allergies   Allergen Reactions   ? Sulfa (Sulfonamide Antibiotics) HIVES and SHORTNESS OF BREATH       Objective:         ? acetaminophen SR(+) (TYLENOL) 650 mg tablet Take 1,950 mg by mouth twice daily.   ? cholecalciferol (VITAMIN D-3) 125 mcg (5,000 unit) tablet Take 5,000 Units by mouth daily.   ? ferrous sulfate 325 mg (65 mg iron) tablet Take one tablet by mouth daily. Take on an empty stomach with orange juice   ? gabapentin (NEURONTIN) 300 mg capsule Take 3 caps twice a day.  Indications: Neuropathic Pain   ? loratadine (CLARITIN) 10 mg tablet Take 1 Tab by mouth every morning.   ? MAGNESIUM PO Take  by mouth.   ? pantoprazole DR (PROTONIX) 20 mg tablet Take 20 mg by mouth daily.     Vitals:    12/05/18 1454 12/05/18 1455   BP:  128/73   BP Source:  Arm, Right Upper   Pulse:  89   Resp:  18   Temp:  36.4 ?C (97.5 ?F)   TempSrc:  Oral   SpO2:  97%   Weight:  81.7 kg (180 lb 3.2 oz)   Height:  165.1 cm (65)   PainSc: Zero      Body mass index is 29.99 kg/m?Marland Kitchen                  Pain Addressed:  N/A    Patient Evaluated for a Clinical Trial: No treatment clinical trial available for this patient.     [place] Cooperative Oncology Group performance status is 0, Fully active, able to carry on all pre-disease performance without restriction.Marland Kitchen     Physical Exam  Vitals signs reviewed.   Chest:                RIGHT BREAST EXAM:  Breast:  No palpable breast masses. No skin, nipple, or areolar change.   Skin Erythema:  No  Attachment of Overlying Skin:  No  Peau d' orange:  No  Chest Wall Attachment:  No  Nipple Inversion:  No  Nipple Discharge: No    LEFT BREAST EXAM:  Breast: Consistent with previous lumpectomy/SLNB.  Central breast lymphedema noted.  No palpable masses.   Skin Erythema:  No  Attachment of Overlying Skin:  No  Peau d' orange:  No  Chest Wall Attachment: No  Nipple Inversion:  No  Nipple Discharge:  No    RIGHT NODAL BASIN EXAM:  Axillary:  negative  Infraclavicular:  negative  Supraclavicular:  negative    LEFT NODAL BASIN EXAM:  Axillary:  negative  Infraclavicular: negative  Supraclavicular:  negative    Constitutional: No acute distress.  HEENT:  Head: Normocephalic and atraumatic.  Eyes: No discharge. No scleral icterus.  Pulmonary/Chest: No respiratory distress.   Lymphadenopathy: There is no axillary, infraclavicular, or supraclavicular adenopathy.  Neurological: Alert and oriented to person, place and  time. No cranial nerve deficit.  Skin: Warm and dry. No rash noted. No erythema. No pallor.  Psychiatric: Normal mood and affect. Behavior is normal. Judgement and thought content normal.         Assessment and Plan:    73 y/o female with left grade 3 IDC (ER 0%, PR 0%, HER2 1+, Ki-67 68%) at 12:00, dx 05/2015.  ypCR - NED    Ms. Kempinski continues to do well.  There is no clinical evidence of recurrence.  There have been no changes to her past medical or family history since her last visit other than a left foot fracture for which she had surgery in 02/2018.   She had a bilateral screening mammogram today, and results are pending.  I will contact her with the results when they are available. We discussed her left breast lymphedema which is persistent and sometimes bothersome to her.  We discussed using a compression bra to clear the congestion, which she states she does not regularly do.  She is not overly bothered by her symptoms and is therefore not motivated to change garments at this time.  She was reassured lymphedema is not harmful, but does cause the discomfort she feels.  She will continue to follow with Dr. Stasia Cavalier.  Ms. Ravenel had bioimpedence spectroscopy (BIS) measurements today, and will be contacted by a lymphedema nurse with her results.  Provided her mammogram today is benign, she no longer needs to follow in our clinic on a regular basis.  Her mammograms can be ordered by Dr. Stasia Cavalier as long as she continues to follow with her, and she can then transition to the survivorship clinic.  She will RTC prn.  Ms. Mccraney was given ample time to ask questions all of which were answered to her satisfaction.     1.  Call with mammogram results.  Pt notified of benign results  2.  Continue to follow with Dr. Stasia Cavalier  3.  Bilateral screening mammogram in 1 year  4.  RTC prn    Scarlett Presto, PA-C

## 2019-04-04 ENCOUNTER — Ambulatory Visit: Admit: 2019-04-04 | Discharge: 2019-04-05 | Payer: MEDICARE

## 2019-04-04 MED ORDER — COVID-19 VACC,MRNA(PFIZER)(PF) 30 MCG/0.3 ML IM SUSR
30 ug | Freq: Once | INTRAMUSCULAR | 0 refills | Status: CP
Start: 2019-04-04 — End: ?
  Administered 2019-04-04: 18:00:00 30 ug via INTRAMUSCULAR

## 2019-04-25 ENCOUNTER — Ambulatory Visit: Admit: 2019-04-25 | Discharge: 2019-04-26 | Payer: MEDICARE

## 2019-04-25 MED ORDER — COVID-19 VACC,MRNA(PFIZER)(PF) 30 MCG/0.3 ML IM SUSR
30 ug | Freq: Once | INTRAMUSCULAR | 0 refills | Status: CP
Start: 2019-04-25 — End: ?
  Administered 2019-04-25: 18:00:00 30 ug via INTRAMUSCULAR

## 2019-04-26 DIAGNOSIS — Z23 Encounter for immunization: Secondary | ICD-10-CM

## 2019-05-01 ENCOUNTER — Encounter: Admit: 2019-05-01 | Discharge: 2019-05-01 | Payer: MEDICARE

## 2019-05-03 ENCOUNTER — Encounter: Admit: 2019-05-03 | Discharge: 2019-05-03 | Payer: MEDICARE

## 2019-05-03 ENCOUNTER — Inpatient Hospital Stay: Admit: 2019-05-03 | Payer: MEDICARE

## 2019-05-03 ENCOUNTER — Inpatient Hospital Stay: Admit: 2019-05-03 | Discharge: 2019-05-03 | Payer: MEDICARE

## 2019-05-03 DIAGNOSIS — S32591S Other specified fracture of right pubis, sequela: Secondary | ICD-10-CM

## 2019-05-03 MED ORDER — ACETAMINOPHEN 325 MG PO TAB
650 mg | ORAL | 0 refills | Status: DC
Start: 2019-05-03 — End: 2019-05-04

## 2019-05-03 MED ORDER — PANTOPRAZOLE 20 MG PO TBEC
20 mg | Freq: Every day | ORAL | 0 refills | Status: DC
Start: 2019-05-03 — End: 2019-05-07
  Administered 2019-05-04 – 2019-05-07 (×4): 20 mg via ORAL

## 2019-05-03 MED ORDER — FENTANYL CITRATE (PF) 50 MCG/ML IJ SOLN
25-50 ug | INTRAVENOUS | 0 refills | Status: DC | PRN
Start: 2019-05-03 — End: 2019-05-04
  Administered 2019-05-04 (×2): 50 ug via INTRAVENOUS

## 2019-05-03 MED ORDER — SODIUM CHLORIDE 0.9 % IJ SOLN
50 mL | Freq: Once | INTRAVENOUS | 0 refills | Status: CP
Start: 2019-05-03 — End: ?
  Administered 2019-05-04: 03:00:00 50 mL via INTRAVENOUS

## 2019-05-03 MED ORDER — LACTATED RINGERS IV SOLP
INTRAVENOUS | 0 refills | Status: DC
Start: 2019-05-03 — End: 2019-05-04
  Administered 2019-05-04: 10:00:00 1000.000 mL via INTRAVENOUS

## 2019-05-03 MED ORDER — IOPAMIDOL 76 % IV SOLN
100 mL | Freq: Once | INTRAVENOUS | 0 refills | Status: CP
Start: 2019-05-03 — End: ?
  Administered 2019-05-04: 03:00:00 100 mL via INTRAVENOUS

## 2019-05-03 MED ORDER — ONDANSETRON HCL (PF) 4 MG/2 ML IJ SOLN
4-8 mg | INTRAVENOUS | 0 refills | Status: DC | PRN
Start: 2019-05-03 — End: 2019-05-07

## 2019-05-03 MED ORDER — DIATRIZOATE MEG-DIATRIZOAT SOD 66-10 % PO SOLN
30 mL | Freq: Once | ORAL | 0 refills | Status: DC
Start: 2019-05-03 — End: 2019-05-08

## 2019-05-03 MED ORDER — FENTANYL CITRATE (PF) 50 MCG/ML IJ SOLN
25-50 ug | INTRAVENOUS | 0 refills | Status: DC | PRN
Start: 2019-05-03 — End: 2019-05-04
  Administered 2019-05-04 (×2): 50 ug via INTRAVENOUS

## 2019-05-03 NOTE — ED Notes
Report from Laci RN- care assumed.

## 2019-05-04 ENCOUNTER — Emergency Department: Admit: 2019-05-04 | Discharge: 2019-05-03 | Payer: MEDICARE

## 2019-05-04 LAB — POC CREATININE, RAD: Lab: 1.1 mg/dL — ABNORMAL HIGH (ref 0.4–1.00)

## 2019-05-04 MED ORDER — GABAPENTIN 300 MG PO CAP
300 mg | ORAL | 0 refills | Status: DC
Start: 2019-05-04 — End: 2019-05-07
  Administered 2019-05-04 – 2019-05-07 (×10): 300 mg via ORAL

## 2019-05-04 MED ORDER — LIDOCAINE 5 % TP PTMD
1 | Freq: Every day | TOPICAL | 0 refills | Status: DC
Start: 2019-05-04 — End: 2019-05-07
  Administered 2019-05-04 – 2019-05-07 (×4): 1 via TOPICAL

## 2019-05-04 MED ORDER — SENNOSIDES-DOCUSATE SODIUM 8.6-50 MG PO TAB
1 | Freq: Two times a day (BID) | ORAL | 0 refills | Status: DC
Start: 2019-05-04 — End: 2019-05-07
  Administered 2019-05-04 – 2019-05-07 (×7): 1 via ORAL

## 2019-05-04 MED ORDER — OXYCODONE 5 MG PO TAB
5-10 mg | ORAL | 0 refills | Status: DC | PRN
Start: 2019-05-04 — End: 2019-05-07
  Administered 2019-05-04: 15:00:00 5 mg via ORAL
  Administered 2019-05-04: 20:00:00 10 mg via ORAL
  Administered 2019-05-05 (×4): 5 mg via ORAL
  Administered 2019-05-06: 12:00:00 10 mg via ORAL
  Administered 2019-05-07: 17:00:00 5 mg via ORAL

## 2019-05-04 MED ORDER — METHOCARBAMOL 750 MG PO TAB
750 mg | Freq: Two times a day (BID) | ORAL | 0 refills | Status: DC
Start: 2019-05-04 — End: 2019-05-07
  Administered 2019-05-04 – 2019-05-07 (×7): 750 mg via ORAL

## 2019-05-04 MED ORDER — ACETAMINOPHEN 500 MG PO TAB
1000 mg | ORAL | 0 refills | Status: DC
Start: 2019-05-04 — End: 2019-05-07
  Administered 2019-05-04 – 2019-05-07 (×10): 1000 mg via ORAL

## 2019-05-04 MED ORDER — MAGNESIUM SULFATE IN D5W 1 GRAM/100 ML IV PGBK
1 g | INTRAVENOUS | 0 refills | Status: CP
Start: 2019-05-04 — End: ?
  Administered 2019-05-04: 15:00:00 1 g via INTRAVENOUS

## 2019-05-04 MED ORDER — ENOXAPARIN 30 MG/0.3 ML SC SYRG
30 mg | Freq: Two times a day (BID) | SUBCUTANEOUS | 0 refills | Status: DC
Start: 2019-05-04 — End: 2019-05-07
  Administered 2019-05-04 – 2019-05-07 (×5): 30 mg via SUBCUTANEOUS

## 2019-05-04 NOTE — Consults
[facility] Consult Note      Admission Date: 05/03/2019                                                  Chief Complaint/Reason for Consult:  R sup/inferior pubic ramus fx    Assessment/Plan  Rachael Black is a 74 y.o. female with past medical history significant for anxiety, breast cancer who presents to ED with right pelvic pain after fall    - Images reviewed --> there appears to be a right superior ramus comminuted fracture that extends into the pubic root as well as a nondisplaced subtle right inferior pubic ramus fracture.  The pelvis is stable and no other osseous abnormalities noted.  - Images, history and clinical presentation consistent with stable anterior pelvic ring fracture  -Operative Intervention - Nothing to do per Ortho standpoint.  We will order anteversion series with judet views for baseline.  The pelvic ring is stable and there is no indication for operative fixation.  We will repeat radiographs once the patient mobilizes 20 to 30 feet.  -WB Status - WBAT BLE  -Antibiotics / Tetanus - per primary  -Pain Control - per primary  -Diet - per primary  -DVT PPX - Mechanical, Chemoprophylaxis  - per primary    Patient evaluated with staff surgeon Dr. Criselda Peaches who directed plan of care.      During normal business hours, please contact [employer]. At all other times, contact the orthopedic surgery resident on call for any questions or concerns.     ______________________________________________________________________    History of Present Illness: Rachael Black is a 74 y.o. female.with past medical history significant for anxiety, breast cancer who presents to ED with right pelvic pain after fall.  At bedside, the patient states that she was walking with her husband after lunch in the parking lot and accidentally tripped over a parking cement block.  She subsequently fell on her right side and experienced acute right pelvic pain.  She denies pain anywhere else in the body at the moment and also denies acute numbness and tingling in the groin as well as numbness and tingling in the right lower extremity.  She does not have weakness in her right lower extremity however it is difficult for her to flex her hip secondary to pain.  No open wounds or other abnormalities noted.  Radiographs today demonstrate a right superior and inferior pubic ramus fracture.    Medical History:   Diagnosis Date   ? Arthritis    ? Back pain    ? Cancer (HCC) 06/2015    Breast    ? Foot fracture, left 09/26/2016   ? GERD (gastroesophageal reflux disease)    ? History of chemotherapy     neoadjuvant chemotherapy 2017   ? HLD (hyperlipidemia)    ? Hypertension    ? Multiple sclerosis (HCC) 1990   ? Neuropathy    ? Wrist fracture 09/26/2016    heads of ulna and radius     Surgical History:   Procedure Laterality Date   ? HX RHINOPLASTY  1974   ? HX TUBAL LIGATION  1976   ? ANKLE SURGERY Right 2000    with instrumentation   ? Left radioactive seed localized lumpectomy, left axillary sentinel lymph node biopsy  Left 12/14/2015    Performed by  Cordelia Poche, MD at [facility] OR   ? COLONOSCOPY     ? FOOT SURGERY       Social History     Tobacco Use   ? Smoking status: Former Smoker     Packs/day: 1.00     Years: 10.00     Pack years: 10.00     Types: Cigarettes     Quit date: 02/28/2005     Years since quitting: 14.1   ? Smokeless tobacco: Never Used   Substance Use Topics   ? Alcohol use: Yes     Alcohol/week: 8.0 standard drinks     Types: 8 Standard drinks or equivalent per week   ? Drug use: No     Family History   Problem Relation Age of Onset   ? Arthritis-osteo Mother    ? Thyroid Disease Mother    ? Heart Disease Father    ? High Cholesterol Father    ? Cancer Maternal Aunt    ? Cancer Maternal Uncle    ? Cancer Paternal Uncle    ? Cancer-Colon Maternal Grandmother    ? Stroke Maternal Grandmother    ? Cancer-Colon Paternal Grandmother      Allergies:  Sulfa (sulfonamide antibiotics)    Outpatient Medications as of 05/03/2019 Medication Sig Dispense Refill   ? acetaminophen SR(+) (TYLENOL) 650 mg tablet Take 1,950 mg by mouth twice daily.     ? cholecalciferol (VITAMIN D-3) 125 mcg (5,000 unit) tablet Take 5,000 Units by mouth daily.     ? ferrous sulfate 325 mg (65 mg iron) tablet Take one tablet by mouth daily. Take on an empty stomach with orange juice 30 tablet 1   ? gabapentin (NEURONTIN) 300 mg capsule Take 3 caps twice a day.  Indications: Neuropathic Pain 540 capsule 3   ? loratadine (CLARITIN) 10 mg tablet Take 1 Tab by mouth every morning. 60 Tab 3   ? MAGNESIUM PO Take  by mouth.     ? pantoprazole DR (PROTONIX) 20 mg tablet Take 20 mg by mouth daily.           Review of Systems:  10 Point Review of Systems Obtained. Pertinent items noted in HPI.     Pos -pain  Neg-numbness    Vital Signs:  Last Filed in 24 hours   BP: 162/87 (03/05 2200)  Temp: 36.9 ?C (98.4 ?F) (03/05 1452)  Respirations: 19 PER MINUTE (03/05 1452)  SpO2: 94 % (03/05 2200)  SpO2 Pulse: 79 (03/05 2200)  Height: 165.1 cm (65) (03/05 1452)     Physical Exam:    Constitutional: Alert, NAD  HEENT: Normocephalic, no scleral icterus  Respiratory: Unlabored respirations on RA  Cardiovascular: Regular rate  Abdomen: Nondistended, NTTP  Lymph: No significant lymphedema, no lymphadenopathy of affected extremity  Skin: Normal Turgor  Musculoskeletal:    RLE: Tenderness to palpation about right pelvis.  No open wounds on inspection.  No leg length discrepancy.  NVI distally, TA/GS, EHL/FHL intact.  SILT in DP/SP/Tib/Sur/Saph distributions, DP/PT palpable, Cap refill < 2 sec, warm extremity, soft compartments       Neurologic: Grossly intact, No focal deficits noted unless listed above    Lab/Radiology/Other Diagnostic Tests:    CBC w/Diff   Lab Results   Component Value Date/Time    WBC 7.5 11/02/2018 10:23 AM    HGB 14.6 11/02/2018 10:23 AM    HCT 43.5 11/02/2018 10:23 AM    PLTCT 253 11/02/2018 10:23 AM  Inflammatory Markers   No results found for: ESR, CRP Basic Metabolic Profile   Lab Results   Component Value Date/Time    NA 138 10/02/2017 09:45 AM    K 4.7 10/02/2017 09:45 AM    CL 102 10/02/2017 09:45 AM    CO2 28 10/02/2017 09:45 AM    GAP 8 10/02/2017 09:45 AM    BUN 13 10/02/2017 09:45 AM    CR 1.1 (H) 05/03/2019 06:19 PM    CR 0.87 10/02/2017 09:45 AM    GLU 93 10/02/2017 09:45 AM        Coagulation Studies   Lab Results   Component Value Date/Time    PTT 22.3 (L) 07/28/2017 03:50 PM    INR 0.9 07/28/2017 03:50 PM        Radiology:   Ct Chest W Contrast    Result Date: 05/03/2019  CT Chest, Abdomen, and Pelvis with IV contrast Clinical History:  fallStatus post trauma, chemical fall onto right side. 74 year old female history of breast cancer in remission. Pain to right side ribs and right hip. Unable to walk because of hip pain. Technique: Multiple contiguous axial images were obtained through the chest, abdomen, and pelvis following administration of IV contrast. Post contrast coronal and sagittal reconstructions images of the chest, abdomen and pelvis were made from the axial images. 3-D rendered images of the bony pelvis and proximal femurs are created from axial data following post production processing. Comparison: None Findings: Chest findings: 2 adjacent hypodense lesions in the posterior left lobe of the thyroid, with the larger and more inferior lesion measuring approximately 1.9 x 1.3 cm (series 301, image 19). The heart size is however limits of normal without evidence of a pericardial effusion. Multifocal coronary artery calcifications, moderate in degree. The thoracic aorta is normal in caliber without dissection. Mild aortic atherosclerotic plaque in additional mild plaque at the arch vessel origins and right subclavian artery origin. No mediastinal hematoma or acute aortic injury. No significant axillary, mediastinal or hilar lymphadenopathy. Subcarinal and left hilar calcified granuloma. Large hiatal hernia. Central airways are widely patent. No pleural fluid or pneumothorax is identified. There is a 0.4 cm noncalcified nodule at the subpleural right lower lobe (series 301, image 169). Additional small more linear subpleural opacity in the lateral right upper lobe measuring approximately 0.5 cm (series 301, image 99). Tiny nodular 0.3 cm density at the anterior right upper lobe (series 301, image 106). A few additional tiny right middle lobe noncalcified nodules (series 301, images 160 and 163). There is some mild to moderate left lower lobe volume loss and elongated consolidation adjacent to a large hiatal hernia, most consistent with left lower lobe atelectasis. No definite focal pneumonia or pulmonary contusion. Mild areas of bilateral scattered scarring, greatest at the anterolateral left upper lobe there is some mild subpleural fibrosis. Very mild bilateral lower lobe dependent atelectasis. Some scattered areas of mild mosaic attenuation appearance noted bilaterally, best seen in the right lower lobe (series 301, image 188). No acute thoracic spine fracture or subluxation. No acute displaced rib or sternal fracture identified. Mild, old healed fracture deformity involving the right posterior ninth rib. Mild cortical angulation/deformity of the anterior/anterolateral left fourth through sixth ribs appears subacute to chronic. Additional anterior left seventh minimally displaced rib fracture appears subacute to chronic. No destructive osseous lesion. Mild multilevel degenerative changes in the thoracic spine. Mild right convexity curvature of the thoracic spine. Prior lumpectomy in the left breast with multiple clips present. Abdomen and pelvis  findings: The liver is normal in size without focal lesion or laceration identified. Scattered tiny hepatic calcified granulomas. Gallbladder is normal in size without wall thickening. Several gallstones are present within the dependent gallbladder. No intrahepatic or extrahepatic biliary ductal dilatation. The spleen is normal in size without focal lesion or laceration. Numerous calcified granulomas within the spleen. The adrenal glands appear unremarkable. Mild bilateral renal atrophy with moderate bilateral perinephric stranding. No perinephric hemorrhage or renal laceration identified. No urinary calculus identified. No urinary contrast extravasation on delayed imaging, though there is only minimal opacification of the bladder. Urinary bladder is otherwise unremarkable. Pancreas is moderately atrophic but otherwise appears unremarkable. There is no significant retroperitoneal, inguinal, iliac or mesenteric lymphadenopathy. Large and small bowel loops are normal in caliber without focal bowel hematoma identified. Mild increased retained stool in much the colon without associated wall thickening. Normal appendix. There is no free air, ascites, or hemoperitoneum. Abdominal aorta and iliac arteries shows no dissection or acute injury. Mild fusiform ectasia and tortuosity of the infrarenal abdominal aorta. Moderate aortoiliac and mild left common femoral artery atherosclerotic plaque, mainly calcified. Additional scattered calcification within abdominal arterial branch origins, resulting in mild stenosis at the celiac and right renal artery origins. Moderate splenic artery calcified plaque. Reproductive organs appear grossly unremarkable for patient age. There is a nondisplaced acute appearing fracture of the right inferior pubic ramus. Additional moderately comminuted and minimally displaced fracture involving the right superior pubic ramus and pubis. Normal alignment at the pubic symphysis and bilateral SI joints. Normal alignment at the hip joints. No acetabular fracture. There is some mild bilateral anterior extraperitoneal hemorrhage. Additional mild right pelvic sidewall extraperitoneal hemorrhage with mild asymmetric enlargement of the right obturator internus muscle. No acute lumbar spine fracture or subluxation. Slight loss of height at the L1 superior endplate appears chronic. There is moderate left convexity curvature of the lumbar spine. Multilevel degenerative changes in the lumbar spine, asymmetrically greater along the right aspect of the L2-3 and L3-4 levels and at L4-5 on the left due to scoliotic curvature. There is a mild central canal narrowing and moderate neural foraminal narrowing. No destructive osseous lesion identified. 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 Tiny fat-containing umbilical hernia. No abdominal wall hematoma. There is some mild subcutaneous fat stranding lateral to the right hip consistent with contusion.     Chest: 1. No acute thoracic fracture is identified. Multiple subacute to chronic appearing bilateral rib fractures. 2. Multiple noncalcified bilateral pulmonary nodules measuring up to 5 mm. These are indeterminate for benign etiology such as noncalcified granulomas (given the presence of calcified granulomas) versus pulmonary metastases in a patient with history of breast cancer. Benign etiology is favored though a follow-up CT chest is recommended in 3 months to begin to show longer-term stability in the absence of outside exam showing long-term stability. 3. Large hiatal hernia with some adjacent mild to moderate compressive atelectasis in the left lower lobe. Additional areas of mild bilateral scarring and atelectasis in the bilateral lungs without definite pulmonary contusion or focal pneumonia. Some areas of mild mosaic attenuation bilaterally are likely related to mild small airways disease rather than small vessel disease. 4. Moderate coronary artery calcifications. 5. Hypodense left thyroid lesions, the largest measuring up to 1.9 cm. These are incompletely characterized and a nonemergent thyroid ultrasound is recommended for better characterization, if not previously worked up. Abdomen and Pelvis: 1. Comminuted and minimally displaced fracture of the right superior pubic ramus and right pubis.  Nondisplaced acute fracture of the right inferior pubic ramus is also noted. There is mild associated extraperitoneal hemorrhage, right greater than left, with mild enlargement of the right obturator internus possibly related to muscular edema and/or mild intramuscular hemorrhage. No acetabular fracture or diastases at the pubic symphysis or sacroiliac joints. 2. Mild subcutaneous contusion lateral to the right hip. 3. No major abdominal or pelvic visceral injury. 4. Moderate left convexity curvature lumbar spine multilevel degenerative changes and mild chronic appearing loss of height at the L1 superior endplate. No acute lumbar fracture. 5. Cholelithiasis without acute cholecystitis. 6. Mild bilateral renal atrophy. #FOLLOW  Finalized by Sherolyn Buba, M.D. on 05/03/2019 9:02 PM. Dictated by Sherolyn Buba, M.D. on 05/03/2019 8:35 PM.     [facility] Abd/pelv W Contrast    Result Date: 05/03/2019  CT Chest, Abdomen, and Pelvis with IV contrast Clinical History:  fallStatus post trauma, chemical fall onto right side. 74 year old female history of breast cancer in remission. Pain to right side ribs and right hip. Unable to walk because of hip pain. Technique: Multiple contiguous axial images were obtained through the chest, abdomen, and pelvis following administration of IV contrast. Post contrast coronal and sagittal reconstructions images of the chest, abdomen and pelvis were made from the axial images. 3-D rendered images of the bony pelvis and proximal femurs are created from axial data following post production processing. Comparison: None Findings: Chest findings: 2 adjacent hypodense lesions in the posterior left lobe of the thyroid, with the larger and more inferior lesion measuring approximately 1.9 x 1.3 cm (series 301, image 19). The heart size is however limits of normal without evidence of a pericardial effusion. Multifocal coronary artery calcifications, moderate in degree. The thoracic aorta is normal in caliber without dissection. Mild aortic atherosclerotic plaque in additional mild plaque at the arch vessel origins and right subclavian artery origin. No mediastinal hematoma or acute aortic injury. No significant axillary, mediastinal or hilar lymphadenopathy. Subcarinal and left hilar calcified granuloma. Large hiatal hernia. Central airways are widely patent. No pleural fluid or pneumothorax is identified. There is a 0.4 cm noncalcified nodule at the subpleural right lower lobe (series 301, image 169). Additional small more linear subpleural opacity in the lateral right upper lobe measuring approximately 0.5 cm (series 301, image 99). Tiny nodular 0.3 cm density at the anterior right upper lobe (series 301, image 106). A few additional tiny right middle lobe noncalcified nodules (series 301, images 160 and 163). There is some mild to moderate left lower lobe volume loss and elongated consolidation adjacent to a large hiatal hernia, most consistent with left lower lobe atelectasis. No definite focal pneumonia or pulmonary contusion. Mild areas of bilateral scattered scarring, greatest at the anterolateral left upper lobe there is some mild subpleural fibrosis. Very mild bilateral lower lobe dependent atelectasis. Some scattered areas of mild mosaic attenuation appearance noted bilaterally, best seen in the right lower lobe (series 301, image 188). No acute thoracic spine fracture or subluxation. No acute displaced rib or sternal fracture identified. Mild, old healed fracture deformity involving the right posterior ninth rib. Mild cortical angulation/deformity of the anterior/anterolateral left fourth through sixth ribs appears subacute to chronic. Additional anterior left seventh minimally displaced rib fracture appears subacute to chronic. No destructive osseous lesion. Mild multilevel degenerative changes in the thoracic spine. Mild right convexity curvature of the thoracic spine. Prior lumpectomy in the left breast with multiple clips present. Abdomen and pelvis findings: The liver is normal in size without focal lesion or laceration  identified. Scattered tiny hepatic calcified granulomas. Gallbladder is normal in size without wall thickening. Several gallstones are present within the dependent gallbladder. No intrahepatic or extrahepatic biliary ductal dilatation. The spleen is normal in size without focal lesion or laceration. Numerous calcified granulomas within the spleen. The adrenal glands appear unremarkable. Mild bilateral renal atrophy with moderate bilateral perinephric stranding. No perinephric hemorrhage or renal laceration identified. No urinary calculus identified. No urinary contrast extravasation on delayed imaging, though there is only minimal opacification of the bladder. Urinary bladder is otherwise unremarkable. Pancreas is moderately atrophic but otherwise appears unremarkable. There is no significant retroperitoneal, inguinal, iliac or mesenteric lymphadenopathy. Large and small bowel loops are normal in caliber without focal bowel hematoma identified. Mild increased retained stool in much the colon without associated wall thickening. Normal appendix. There is no free air, ascites, or hemoperitoneum. Abdominal aorta and iliac arteries shows no dissection or acute injury. Mild fusiform ectasia and tortuosity of the infrarenal abdominal aorta. Moderate aortoiliac and mild left common femoral artery atherosclerotic plaque, mainly calcified. Additional scattered calcification within abdominal arterial branch origins, resulting in mild stenosis at the celiac and right renal artery origins. Moderate splenic artery calcified plaque. Reproductive organs appear grossly unremarkable for patient age. There is a nondisplaced acute appearing fracture of the right inferior pubic ramus. Additional moderately comminuted and minimally displaced fracture involving the right superior pubic ramus and pubis. Normal alignment at the pubic symphysis and bilateral SI joints. Normal alignment at the hip joints. No acetabular fracture. There is some mild bilateral anterior extraperitoneal hemorrhage. Additional mild right pelvic sidewall extraperitoneal hemorrhage with mild asymmetric enlargement of the right obturator internus muscle. No acute lumbar spine fracture or subluxation. Slight loss of height at the L1 superior endplate appears chronic. There is moderate left convexity curvature of the lumbar spine. Multilevel degenerative changes in the lumbar spine, asymmetrically greater along the right aspect of the L2-3 and L3-4 levels and at L4-5 on the left due to scoliotic curvature. There is a mild central canal narrowing and moderate neural foraminal narrowing. No destructive osseous lesion identified. 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 Tiny fat-containing umbilical hernia. No abdominal wall hematoma. There is some mild subcutaneous fat stranding lateral to the right hip consistent with contusion.     Chest: 1. No acute thoracic fracture is identified. Multiple subacute to chronic appearing bilateral rib fractures. 2. Multiple noncalcified bilateral pulmonary nodules measuring up to 5 mm. These are indeterminate for benign etiology such as noncalcified granulomas (given the presence of calcified granulomas) versus pulmonary metastases in a patient with history of breast cancer. Benign etiology is favored though a follow-up CT chest is recommended in 3 months to begin to show longer-term stability in the absence of outside exam showing long-term stability. 3. Large hiatal hernia with some adjacent mild to moderate compressive atelectasis in the left lower lobe. Additional areas of mild bilateral scarring and atelectasis in the bilateral lungs without definite pulmonary contusion or focal pneumonia. Some areas of mild mosaic attenuation bilaterally are likely related to mild small airways disease rather than small vessel disease. 4. Moderate coronary artery calcifications. 5. Hypodense left thyroid lesions, the largest measuring up to 1.9 cm. These are incompletely characterized and a nonemergent thyroid ultrasound is recommended for better characterization, if not previously worked up. Abdomen and Pelvis: 1. Comminuted and minimally displaced fracture of the right superior pubic ramus and right pubis. Nondisplaced acute fracture of the right inferior pubic ramus is also noted.  There is mild associated extraperitoneal hemorrhage, right greater than left, with mild enlargement of the right obturator internus possibly related to muscular edema and/or mild intramuscular hemorrhage. No acetabular fracture or diastases at the pubic symphysis or sacroiliac joints. 2. Mild subcutaneous contusion lateral to the right hip. 3. No major abdominal or pelvic visceral injury. 4. Moderate left convexity curvature lumbar spine multilevel degenerative changes and mild chronic appearing loss of height at the L1 superior endplate. No acute lumbar fracture. 5. Cholelithiasis without acute cholecystitis. 6. Mild bilateral renal atrophy. #FOLLOW  Finalized by Sherolyn Buba, M.D. on 05/03/2019 9:02 PM. Dictated by Sherolyn Buba, M.D. on 05/03/2019 8:35 PM.       [place] Ar, MD  [phone]

## 2019-05-04 NOTE — ED Notes
Report to [facility]

## 2019-05-05 ENCOUNTER — Inpatient Hospital Stay: Admit: 2019-05-05 | Discharge: 2019-05-05 | Payer: MEDICARE

## 2019-05-05 MED ORDER — ROSUVASTATIN 20 MG PO TAB
10 mg | Freq: Every day | ORAL | 0 refills | Status: DC
Start: 2019-05-05 — End: 2019-05-07
  Administered 2019-05-05 – 2019-05-07 (×3): 10 mg via ORAL

## 2019-05-05 MED ORDER — POLYETHYLENE GLYCOL 3350 17 GRAM PO PWPK
2 | Freq: Every day | ORAL | 0 refills | Status: DC
Start: 2019-05-05 — End: 2019-05-07
  Administered 2019-05-05 – 2019-05-07 (×3): 34 g via ORAL

## 2019-05-06 ENCOUNTER — Encounter: Admit: 2019-05-06 | Discharge: 2019-05-06 | Payer: MEDICARE

## 2019-05-06 MED ORDER — LIDOCAINE 5 % TP PTMD
1 | MEDICATED_PATCH | Freq: Every day | TOPICAL | 0 refills | 30.00000 days | Status: DC
Start: 2019-05-06 — End: 2019-05-21
  Filled 2019-05-07: qty 30, 30d supply, fill #1

## 2019-05-06 MED ORDER — SENNOSIDES-DOCUSATE SODIUM 8.6-50 MG PO TAB
1 | ORAL_TABLET | Freq: Two times a day (BID) | ORAL | 0 refills | Status: DC
Start: 2019-05-06 — End: 2019-05-21

## 2019-05-06 MED ORDER — METHOCARBAMOL 750 MG PO TAB
750 mg | ORAL_TABLET | Freq: Two times a day (BID) | ORAL | 1 refills | 10.00000 days | Status: DC
Start: 2019-05-06 — End: 2019-05-21
  Filled 2019-05-07: qty 15, 8d supply, fill #1

## 2019-05-06 MED ORDER — ENOXAPARIN 40 MG/0.4 ML SC SYRG
40 mg | Freq: Every day | SUBCUTANEOUS | 0 refills | 7.00000 days | Status: AC
Start: 2019-05-06 — End: ?
  Filled 2019-05-07: qty 14, 35d supply, fill #1

## 2019-05-06 MED ORDER — POLYETHYLENE GLYCOL 3350 17 GRAM PO PWPK
34 g | Freq: Every day | ORAL | 0 refills | 18.00000 days | Status: DC
Start: 2019-05-06 — End: 2019-05-21

## 2019-05-06 MED ORDER — ROSUVASTATIN 10 MG PO TAB
10 mg | ORAL_TABLET | Freq: Every day | ORAL | 0 refills | 90.00000 days | Status: DC
Start: 2019-05-06 — End: 2019-05-07

## 2019-05-06 MED ORDER — OXYCODONE 5 MG PO TAB
5-10 mg | ORAL_TABLET | ORAL | 0 refills | 6.00000 days | Status: DC | PRN
Start: 2019-05-06 — End: 2019-06-14
  Filled 2019-05-07: qty 60, 5d supply, fill #1

## 2019-05-07 ENCOUNTER — Encounter: Admit: 2019-05-07 | Discharge: 2019-05-07 | Payer: MEDICARE

## 2019-05-07 MED ORDER — ROSUVASTATIN 10 MG PO TAB
10 mg | ORAL_TABLET | Freq: Every day | ORAL | 0 refills | 90.00000 days | Status: DC
Start: 2019-05-07 — End: 2019-06-14
  Filled 2019-05-07: qty 60, 60d supply, fill #1

## 2019-05-21 ENCOUNTER — Ambulatory Visit: Admit: 2019-05-21 | Discharge: 2019-05-21 | Payer: MEDICARE

## 2019-05-21 ENCOUNTER — Encounter: Admit: 2019-05-21 | Discharge: 2019-05-21 | Payer: MEDICARE

## 2019-05-21 DIAGNOSIS — S32591D Other specified fracture of right pubis, subsequent encounter for fracture with routine healing: Secondary | ICD-10-CM

## 2019-05-21 NOTE — Progress Notes
[facility] History & Physical Note                                                    Chief Complaint:  pelvic pain    History of Present Illness:  Rachael Black is a 74 y.o. female who on 3/5 tripped over a parking cement block resulting in fall.  She has been seen in the ED and was diagnosed with pubic rami fracture.  She states overall her pain is improving. Continues to have pain mainly about buttock area.   She is getting around her house with a walker.  Currently in a wheelchair today just due to distance of getting into the clinic.  Of note she has multiple sclerosis and states previous to the injury she wasn't using any assistive devices but most likely needed a cane.  She is able to lift her leg without significant pain.    Past History:  Medical History:   Diagnosis Date   ? Arthritis    ? Back pain    ? Cancer (HCC) 06/2015    Breast    ? Foot fracture, left 09/26/2016   ? GERD (gastroesophageal reflux disease)    ? History of chemotherapy     neoadjuvant chemotherapy 2017   ? HLD (hyperlipidemia)    ? Hypertension    ? Multiple sclerosis (HCC) 1990   ? Neuropathy    ? Wrist fracture 09/26/2016    heads of ulna and radius       Surgical History:   Procedure Laterality Date   ? HX RHINOPLASTY  1974   ? HX TUBAL LIGATION  1976   ? ANKLE SURGERY Right 2000    with instrumentation   ? Left radioactive seed localized lumpectomy, left axillary sentinel lymph node biopsy  Left 12/14/2015    Performed by Cordelia Poche, MD at [facility]   ? COLONOSCOPY     ? FOOT SURGERY         Social History     Tobacco Use   ? Smoking status: Former Smoker     Packs/day: 1.00     Years: 10.00     Pack years: 10.00     Types: Cigarettes     Quit date: 02/28/2005     Years since quitting: 14.2   ? Smokeless tobacco: Never Used   Substance Use Topics   ? Alcohol use: Yes     Alcohol/week: 8.0 standard drinks     Types: 8 Standard drinks or equivalent per week   ? Drug use: No       Family History   Problem Relation Age of Onset   ? Arthritis-osteo Mother    ? Thyroid Disease Mother    ? Heart Disease Father    ? High Cholesterol Father    ? Cancer Maternal Aunt    ? Cancer Maternal Uncle    ? Cancer Paternal Uncle    ? Cancer-Colon Maternal Grandmother    ? Stroke Maternal Grandmother    ? Cancer-Colon Paternal Grandmother         Allergies:   Allergies   Allergen Reactions   ? Sulfa (Sulfonamide Antibiotics) HIVES and SHORTNESS OF BREATH       Medications:  Current Outpatient Medications:   ?  acetaminophen SR(+) (TYLENOL) 650 mg tablet,  Take 1,950 mg by mouth twice daily., Disp: , Rfl:   ?  enoxaparin (LOVENOX) 40 mg injection syringe, Inject 0.4 mL under the skin daily for 35 days., Disp: 14 mL, Rfl: 0  ?  gabapentin (NEURONTIN) 300 mg capsule, Take 3 caps twice a day.  Indications: Neuropathic Pain (Patient taking differently: Take 600 mg by mouth three times daily. Take 3 caps twice a day.  Indications: neuropathic pain), Disp: 540 capsule, Rfl: 3  ?  oxyCODONE (ROXICODONE) 5 mg tablet, Take one tablet to two tablets by mouth every 4 hours as needed, Disp: 60 tablet, Rfl: 0  ?  pantoprazole DR (PROTONIX) 20 mg tablet, Take 20 mg by mouth daily., Disp: , Rfl:   ?  rosuvastatin (CRESTOR) 10 mg tablet, Take one tablet by mouth daily., Disp: 60 tablet, Rfl: 0    Review of Systems:   10 point review of systems reviewed with the patient.   Pertinent positives: Right-sided pelvis pain.  Pertinent negatives: Numbness and tingling      Physical Exam:  LMP  (LMP Unknown)     General: AAOx3, NAD  ENT: Oropharynx/Nasopharynx clear  Respiratory: Unlabored respirations  Cardio: RRR  GI: soft, NT, ND  Skin: warm and dry, minimal ecchymosis posterior to greater troch  Musculoskeletal: sensation intact distally to light touch; +pf/df/ehl.  Full range of motion of right knee.  No pain with gentle range of motion right hip with extreme of internal rotation she does have mild pain.  Tenderness about superior pubic rami but negative about pubic symphysis.  Neurologic: grossly intact    Radiology:  Interval healing of pubic rami fractures.      Assessment/Plan:  Right inferior and superior pubic rami fractures.    Weightbearing as tolerated right lower extremity.  Prescription for physical therapy given for gait training and strengthening of right lower extremity.  Continue Lovenox as prescribed until 5 weeks out from injury.  Follow-up in 3 weeks for repeat x-rays.  Patient discussed while in hospital she was told about a thyroid nodule and pulmonary nodules that need further work-up.  She is unhappy with her primary care physician up in [place] and would like referral to one here at [facility].  Referral was placed for further work-up of these issues.  Patient states she has not had a bone density scan done in years.  I recommended that she follow-up with her primary care provider for repeat of the scan.  Repeat vitamin D level.    Hector Brunswick, PA-C

## 2019-06-14 ENCOUNTER — Encounter: Admit: 2019-06-14 | Discharge: 2019-06-14 | Payer: MEDICARE

## 2019-06-14 ENCOUNTER — Ambulatory Visit: Admit: 2019-06-14 | Discharge: 2019-06-14 | Payer: MEDICARE

## 2019-06-14 DIAGNOSIS — K219 Gastro-esophageal reflux disease without esophagitis: Secondary | ICD-10-CM

## 2019-06-14 DIAGNOSIS — S62109A Fracture of unspecified carpal bone, unspecified wrist, initial encounter for closed fracture: Secondary | ICD-10-CM

## 2019-06-14 DIAGNOSIS — Z780 Asymptomatic menopausal state: Secondary | ICD-10-CM

## 2019-06-14 DIAGNOSIS — E559 Vitamin D deficiency, unspecified: Secondary | ICD-10-CM

## 2019-06-14 DIAGNOSIS — G629 Polyneuropathy, unspecified: Secondary | ICD-10-CM

## 2019-06-14 DIAGNOSIS — R911 Solitary pulmonary nodule: Secondary | ICD-10-CM

## 2019-06-14 DIAGNOSIS — D7589 Other specified diseases of blood and blood-forming organs: Secondary | ICD-10-CM

## 2019-06-14 DIAGNOSIS — G35 Multiple sclerosis: Secondary | ICD-10-CM

## 2019-06-14 DIAGNOSIS — I10 Essential (primary) hypertension: Secondary | ICD-10-CM

## 2019-06-14 DIAGNOSIS — E785 Hyperlipidemia, unspecified: Secondary | ICD-10-CM

## 2019-06-14 DIAGNOSIS — I2510 Atherosclerotic heart disease of native coronary artery without angina pectoris: Secondary | ICD-10-CM

## 2019-06-14 DIAGNOSIS — M48061 Spinal stenosis, lumbar region without neurogenic claudication: Secondary | ICD-10-CM

## 2019-06-14 DIAGNOSIS — M4125 Other idiopathic scoliosis, thoracolumbar region: Secondary | ICD-10-CM

## 2019-06-14 DIAGNOSIS — E041 Nontoxic single thyroid nodule: Secondary | ICD-10-CM

## 2019-06-14 DIAGNOSIS — M549 Dorsalgia, unspecified: Secondary | ICD-10-CM

## 2019-06-14 DIAGNOSIS — E782 Mixed hyperlipidemia: Secondary | ICD-10-CM

## 2019-06-14 DIAGNOSIS — M1990 Unspecified osteoarthritis, unspecified site: Secondary | ICD-10-CM

## 2019-06-14 DIAGNOSIS — Z9221 Personal history of antineoplastic chemotherapy: Secondary | ICD-10-CM

## 2019-06-14 DIAGNOSIS — G620 Drug-induced polyneuropathy: Secondary | ICD-10-CM

## 2019-06-14 DIAGNOSIS — S92902A Unspecified fracture of left foot, initial encounter for closed fracture: Secondary | ICD-10-CM

## 2019-06-14 DIAGNOSIS — C801 Malignant (primary) neoplasm, unspecified: Secondary | ICD-10-CM

## 2019-06-14 LAB — VITAMIN B12: Lab: 189 pg/mL (ref 180–914)

## 2019-06-14 LAB — FOLATE, SERUM: Lab: 14 ng/mL (ref >3.9)

## 2019-06-14 MED ORDER — PANTOPRAZOLE 20 MG PO TBEC
20 mg | ORAL_TABLET | Freq: Every day | ORAL | 3 refills | 30.00000 days | Status: AC
Start: 2019-06-14 — End: ?

## 2019-06-14 MED ORDER — GABAPENTIN 300 MG PO CAP
ORAL_CAPSULE | Freq: Two times a day (BID) | 3 refills | Status: AC
Start: 2019-06-14 — End: ?

## 2019-06-14 MED ORDER — ROSUVASTATIN 10 MG PO TAB
10 mg | ORAL_TABLET | Freq: Every day | ORAL | 3 refills | 90.00000 days | Status: AC
Start: 2019-06-14 — End: ?

## 2019-06-14 NOTE — Progress Notes
Date of Service: 06/14/2019    Rachael Black is a 74 y.o. female.  DOB: 12-Sep-1945  MRN: [id number]     Subjective:             History of Present Illness  Chief Complaint   Patient presents with   ? Establish Care     Wann hos stay       Patient is switching her primary care to [place].     Drug-induced polyneuropathy (HCC)  From previous chemo/radiation. She is on gabapentin, which helps her symptoms. Symptoms are in feet and in lower legs.    Multiple sclerosis [facility])  Patient has not seen neurology for some time now. She does not take any medications. She does not take a vit D supplement.     Gastroesophageal reflux disease, unspecified whether esophagitis present  Patient is on protonix and symptoms are well controlled. She has a hx of esophogeal erosions. EGD was last in 2018    Mixed hyperlipidemia  Patient is compliant with statin. She denies side effects. She started statin in March 2021.    Menopause  Patient has had multiple fractures recently. Her last dexa was >10 years ago.     Pulmonary nodule  CT March 2021  Multiple noncalcified bilateral pulmonary nodules measuring up to 5 mm.   These are indeterminate for benign etiology such as noncalcified   granulomas (given the presence of calcified granulomas) versus pulmonary   metastases in a patient with history of breast cancer. Benign etiology is   favored though a follow-up CT chest is recommended in 3 months to begin to   show longer-term stability in the absence of outside exam showing   long-term stability.     Thyroid nodule  CT March 2021  Hypodense left thyroid lesions, the largest measuring up to 1.9 cm.   These are incompletely characterized and a nonemergent thyroid ultrasound   is recommended for better characterization, if not previously worked up.     Hx of breast cancer  Diagnosed in 2017. Treated with chemo, lumpectomy, and radiation. Remission in 2018.    Pelvic Fracture  Patient fell and tripped over a parking marker, March 2021. She had home health and home PT for several weeks. pubic rami fracture. She is following with ortho and has an appointment with them in a couple weeks for repeat xrays. She is still on lovenox.    Coronary Artery calcifications, moderate  Seen on CT March 2021    Macrocytosis  Seen on recent blood work. Patient has a hx of Vit B12 def.     Chronic back pain/Scoliosis/Lumbar spinal stenosis  There is moderate left convexity curvature of the lumbar spine. Multilevel   degenerative changes in the lumbar spine, asymmetrically greater along the   right aspect of the L2-3 and L3-4 levels and at L4-5 on the left due to   scoliotic curvature. There is a mild central canal narrowing and moderate   neural foraminal narrowing.     Fertility  G2P2  No complications       Preventative Medicine:  Colonoscopy-2018 with recommended follow up in 5 years.  Mammogram-2020 normal  Pap-no hx of abnormal pap  Dexa-10 years ago  Tdap-recommended  Shingles-completed 2019      Medical History:   Diagnosis Date   ? Arthritis    ? Back pain    ? Cancer (HCC) 06/2015    Breast    ? Foot fracture, left 09/26/2016   ?  GERD (gastroesophageal reflux disease)    ? History of chemotherapy     neoadjuvant chemotherapy 2017   ? HLD (hyperlipidemia)    ? Hypertension    ? Multiple sclerosis (HCC) 1990   ? Neuropathy    ? Wrist fracture 09/26/2016    heads of ulna and radius      Surgical History:   Procedure Laterality Date   ? HX RHINOPLASTY  1974   ? HX TUBAL LIGATION  1976   ? ANKLE SURGERY Right 2000    with instrumentation   ? Left radioactive seed localized lumpectomy, left axillary sentinel lymph node biopsy  Left 12/14/2015    Performed by Cordelia Poche, MD at [facility]   ? COLONOSCOPY     ? FOOT SURGERY          Family History   Problem Relation Age of Onset   ? Arthritis-osteo Mother    ? Thyroid Disease Mother    ? Heart Disease Father    ? High Cholesterol Father    ? Cancer Maternal Aunt    ? Cancer Maternal Uncle    ? Cancer Paternal Uncle    ? Cancer-Colon Maternal Grandmother 67   ? Stroke Maternal Grandmother    ? Cancer-Colon Paternal Grandmother         Social History     Socioeconomic History   ? Marital status: Married     Spouse name: Not on file   ? Number of children: Not on file   ? Years of education: Not on file   ? Highest education level: Not on file   Occupational History   ? Not on file   Tobacco Use   ? Smoking status: Former Smoker     Packs/day: 1.00     Years: 10.00     Pack years: 10.00     Types: Cigarettes     Quit date: 02/28/2005     Years since quitting: 14.2   ? Smokeless tobacco: Never Used   Substance and Sexual Activity   ? Alcohol use: Yes     Alcohol/week: 8.0 standard drinks     Types: 8 Standard drinks or equivalent per week   ? Drug use: No   ? Sexual activity: Not on file   Other Topics Concern   ? Not on file   Social History Narrative    Patient lives with husband. 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         Review of Systems   Constitutional: Negative for activity change, fatigue and fever.   HENT: Negative.    Respiratory: Negative.    Cardiovascular: Negative.    Gastrointestinal: Negative.    Musculoskeletal: Positive for arthralgias and back pain.   Skin: Negative for rash.   Neurological: Negative.    Psychiatric/Behavioral: Negative.        Objective:         ? acetaminophen SR(+) (TYLENOL) 650 mg tablet Take 1,950 mg by mouth twice daily.   ? gabapentin (NEURONTIN) 300 mg capsule Take 3 caps twice a day.  Indications: neuropathic pain   ? pantoprazole DR (PROTONIX) 20 mg tablet Take one tablet by mouth daily.   ? rosuvastatin (CRESTOR) 10 mg tablet Take one tablet by mouth daily.     Vitals:    06/14/19 0827   BP: 134/74   Pulse: 77   Resp: 14   Temp: 36.6 ?C (97.9 ?F)   TempSrc: Temporal  SpO2: 97%   Weight: 81.2 kg (179 lb)   Height: 165.1 cm (65)   PainSc: Six     Body mass index is 29.79 kg/m?Marland Kitchen     Physical Exam  Constitutional:       General: She is not in acute distress. Cardiovascular:      Rate and Rhythm: Normal rate and regular rhythm.   Pulmonary:      Effort: Pulmonary effort is normal.      Breath sounds: Normal breath sounds.   Musculoskeletal:      Right lower leg: No edema.      Left lower leg: No edema.      Comments: Scoliosis noted on exam in the thoracolumbar region. Increased paraspinal muscle tension in the thoracic and lumbar region. Tenderness over the left IT band.   Skin:     General: Skin is warm and dry.   Neurological:      Mental Status: She is alert and oriented to person, place, and time.      Sensory: Sensory deficit (neuropathy bilateral feet) present.      Gait: Gait abnormal (with cane).   Psychiatric:         Mood and Affect: Mood normal.              Assessment and Plan:  Rachael Black was seen today for establish care.    Diagnoses and all orders for this visit:    Drug-induced polyneuropathy (HCC)  Continue with gabapentin  Multiple sclerosis (HCC)  Follow-up with neurology  Gastroesophageal reflux disease, unspecified whether esophagitis present  -     pantoprazole DR (PROTONIX) 20 mg tablet; Take one tablet by mouth daily.    Mixed hyperlipidemia  -     LIPID PROFILE; Future; Expected date: 12/14/2019  -     rosuvastatin (CRESTOR) 10 mg tablet; Take one tablet by mouth daily.    Menopause  -     BONE DENSITY SPINE/HIP; Future; Expected date: 06/14/2019    Pulmonary nodule  -     CT CHEST W CONTRAST; Future; Expected date: 08/05/2019    Thyroid nodule  -     US THYROID; Future; Expected date: 06/14/2019    Neuropathy  -     gabapentin (NEURONTIN) 300 mg capsule; Take 3 caps twice a day.  Indications: neuropathic pain    Macrocytosis  -     VITAMIN B12; Future; Expected date: 06/14/2019  -     FOLATE, SERUM; Future; Expected date: 06/14/2019    Vitamin D deficiency  -     25-OH VITAMIN D (D2 + D3); Future; Expected date: 06/14/2019    Spinal stenosis of lumbar region without neurogenic claudication  -     AMB REFERRAL TO PHYSICAL THERAPY Other idiopathic scoliosis, thoracolumbar region  -     AMB REFERRAL TO PHYSICAL THERAPY    Return if back pain symptoms are not improving with physical therapy.  Problem   Multiple Sclerosis (Hcc)   Gerd (Gastroesophageal Reflux Disease)   Hld (Hyperlipidemia)   Menopause   Pulmonary Nodule   Thyroid Nodule                 Return in about 6 months (around 12/14/2019).

## 2019-06-17 MED ORDER — ALENDRONATE 70 MG PO TAB
70 mg | ORAL_TABLET | ORAL | 3 refills | 84.00000 days | Status: AC
Start: 2019-06-17 — End: ?

## 2019-06-18 MED ORDER — MELOXICAM 15 MG PO TAB
15 mg | ORAL_TABLET | Freq: Every day | ORAL | 0 refills | 30.00000 days | Status: AC
Start: 2019-06-18 — End: ?

## 2019-06-18 NOTE — Progress Notes
CC: Follow up visit    HPI:     Right side of the pelvis is doing well. States however 10 days ago she sat down hard. Initially had no pain however over the last few days she developed pain about her left buttock. Depending on how she sits she has no issues but pain especially when rising from a chair and with ambulation. Only using a cane at this time. Also does have some lateral hip pain. Denies groin pain.     Physical Exam   Height 165.1 cm (65), weight 81.2 kg (179 lb).   Body mass index is 29.79 kg/m?Marland Kitchen    No tenderness about bilateral groins. +TTP about ischial tuberosity. Minimal tenderness about SI joint. NO tenderness about spine. -Log roll test. +EHL/PF/DF, 2+DPP, SILT bilaterally    Xrays: Interval healing of right pubic rami fractures. No bony pathology about lateral side of pelvis    Assessment & Plan:    Right pubic rami fracture    -WBAT BLE  -Mobic for two weeks. Keep taking protonix to protect against ulcer  -Follow up in 6 weeks for repeat xrays.

## 2019-06-27 NOTE — Patient Education
Dear Ms. Rachael Black,    Thank you for choosing [facility] Interventional Radiology for your procedure. Your appointment information is listed below:  Appointment Date: 07/04/19  Appointment Time: 3:00pm  Arrival Time: 2:00pm  Location:   ? [employer]: [address], [place], [place]  [address]  Parking: [address] Parking Garage/ Go to main entrance and then to registration      INTERVENTIONAL RADIOLOGY  PRE-PROCEDURE INSTRUCTIONS     You are scheduled for a procedure in Interventional Radiology.  Please follow these instructions and any direction from your Primary Care/Managing Physician.  If you have questions about your procedure or need to reschedule please call [phone].    Medication Instructions:   Continue your regular medications as directed.     Diet Instructions: No dietary restrictions      Day of Exam Instructions:  1. Bathe or shower with an antibacterial soap prior to your appointment.  2. Bring a list of your current medications and the dosages.  3. Wear comfortable clothing and leave valuables at home.  4. Arrive (1) hour prior to your appointment.  This time will be spent registering, interviewing, assessing, educating and preparing you for the test.  ? You will be with [place] anywhere from 30 minutes to 6 hours after your exam depending on your procedure.    Interventional Radiology Team  Perioperative and Procedural Scheduling Department  [facility]  Ph: [phone]

## 2019-06-27 NOTE — Progress Notes
Interventional Radiology Outpatient Scheduling Checklist      1.  Name of Procedure(s):   US guided thyroid biopsy      2.  Date of Procedure:   07/04/19      3.  Arrival Time:   1400      4.  Procedure Time:  1500      5.  Correct Procedural Room Assignment:  CA rm 2      6.  Blood Thinners Triaged and instructed per protocol: Y/N/NA:  NA  Confirmed accurate instructions sent to patient: Y/N:  NA       7.  Procedure Order Verified: Y/N:  Yes      9.  Patient instructed to have a driver: Y/N/NA:  NA/ husband will be w/ her    10.  Patient instructed on NPO status: Y/N/NA:  No dietary restrictions   Confirmed accurate instructions sent to patient: Y/N:  Yes    11.  Specimen needed: Y/N/NA:  Yes   Verified Order placed: Y/N:  Yes    12.  Allergies Verified:  Y/N:  Yes    13.  Is there an Iodine Allergy: Y/N:  No  Does the Procedure Require contrast: Y/N:  No   If so, was the IR- Contrast Allergy Pre-Procedure Medication protocol ordered: Y/NA:  NA    14.  Does the patient have labs according to IR Pre-procedure Laboratory Parameter policy: Y/N/NA:  NA  If No, was the patient instructed to obtain labs prior to procedure: Y/N/NA:  NA     15.  Will the patient need to be admitted or have a possible admission: Y/N:  No  If yes, confirmed accurate instructions sent to patient: Y/N/NA:  NA     16.  Patient States Understanding: Y/N:  Yes    17.  History of OSA:  Y/N:  No  If yes, confirm request to bring CPAP sent to patient: Y/N/NA:  NA    18. Patient declines electronic procedure instructions: Y/N:  No

## 2019-07-04 ENCOUNTER — Encounter: Admit: 2019-07-04 | Discharge: 2019-07-04 | Payer: MEDICARE

## 2019-07-04 ENCOUNTER — Ambulatory Visit: Admit: 2019-07-04 | Discharge: 2019-07-04 | Payer: MEDICARE

## 2019-07-04 NOTE — Progress Notes
Pt recovery complete.     Ambulatory status: Ambulatory.   Vitals stable. Dressing is clean, dry, and intact.  Pain level returned to baseline.  Discharge instructions reviewed with: patient  AVS signed and provided to patient.    Pt discharged to main lobby via wheelchair transport.

## 2019-07-04 NOTE — Other
Immediate Post Procedure Note    Date:  07/04/2019                                         Attending Physician:   Tyreese Thain MD    Procedure(s):  Left thyroid nodule fna  Pre/Post Diagnosis:  nodule  Description/Findings:  Partially cystic  Anesthesia:  2% lidocaine       Time out performed: Consent obtained, correct patient verified, correct procedure verified, correct site verified, patient marked as necessary.  Estimated Blood Loss:  None/Negligible  Specimen(s) Removed/Disposition:  Cytology present and 2 samples taken  Complications: None    Jasmine Pang, MD

## 2019-07-04 NOTE — H&P (View-Only)
Pre Procedure History and Physical/Sedation Plan      Procedure Date:  07/04/2019    Planned Procedure(s): US guided  thyroid nodule biopsy    Indication for exam: Thyroid nodule  ________________________________________________________________    Chief Complaint:   Thyroid nodule    Previous Anesthetic/Sedation History:  NA      Allergies:  Sulfa (sulfonamide antibiotics)  Medications:  Scheduled Meds:Continuous Infusions:  PRN and Respiratory Meds:       Vital Signs:  Last Filed Vital Signs: 24 Hour Range   BP: 177/107 (05/06 1423)  Temp: 36.9 ?C (98.4 ?F) (05/06 1424)  Pulse: 86 (05/06 1424)  Respirations: 20 PER MINUTE (05/06 1424)  SpO2: 92 % (05/06 1424)  SpO2 Pulse: 86 (05/06 1424) BP: (177)/(107)   Temp:  [36.9 ?C (98.4 ?F)]   Pulse:  [86]   Respirations:  [19 PER MINUTE-20 PER MINUTE]   SpO2:  [92 %]      Sedation/Medication Plan: Lidocaine  Personal history of sedation complications: Denies adverse event.   Family history of sedation complications: Denies adverse event.   Medications for Reversal: None  Discussion/Reviews:  Physician has discussed risks and alternatives of this type of sedation and above planned procedures with patient    NPO Status: Acceptable  Anesthesia Classification:  ASA III (A patient with a severe systemic disease that limits activity, but is not incapacitating)  Pregnancy Status: Not Pregnant    Lab/Radiology/Other Diagnostic Tests  Labs:  Relevant labs reviewed    I have examined the patient, and there are no significant changes in their condition, from the previous H&P performed on 06/24/2019.    Charlea Nardo P Divine-Thiele, APRN-NP  Pager [phone]

## 2019-07-04 NOTE — Progress Notes
Pt arrived to IR pre/post for procedure work up.     Mobility status: Ambulatory  Procedure and discharge instructions reviewed and pt verbalizes understanding.   Pt cart locked in low position, call light within reach.   See doc flowsheets for further details.    Will continue to monitor patient.

## 2019-07-04 NOTE — Patient Instructions
INTERVENTIONAL RADIOLOGY DISCHARGE INSTRUCTIONS AT THE [facility]  THYROID BIOPSY     ?  A thyroid biopsy is a procedure in which a tiny amount of tissue is removed from your thyroid gland or from a nodule or lesion on your thyroid gland.? This may also be referred to as ?fine needle aspiration?.? During this procedure, a radiologist uses ultrasound guidance to insert a small needle into the abnormal area and aspirate a sample of tissue.?  POST-PROCEDURE PAIN:  ? Pain control following your procedure is a priority for both you and your Physicians.  ? Some soreness or tenderness at the site is to be expected for several days. We recommend taking over the counter analgesics to help relieve this pain.  ? Alternative methods for pain relief include but not limited to heat or cold compress, relaxation techniques, rest, and changing of positions.?  ? If pain continues after 5-7 days or you have severe pain not relieved by medication, please contact us as directed below.?  POST-PROCEDURE ACTIVITY:  ? A responsible adult must drive you home. ???  ? If you receive sedation, narcotic pain medication or anesthesia for the procedure, you should not drive or operate heavy machinery or do anything that requires concentration for at least 24 hours after procedure completion.  ? It is recommended that a responsible adult be with you until morning.?  POST-PROCEDURE SITE CARE:  ? You will have a small bandage over the procedure site.? Keep this dry.?  ? You may remove it in 24 hours.  ? You may shower in 24 hours, after removing the bandage.  ? Do not submerge the procedure site for 1 week (no bathtub, swimming, hot tub, etc.)  ? Do not use ointments, creams or powders on the puncture site.  ? Be sure your hands are clean when touching near the site?  DIET/MEDICATIONS:  ? You may resume your previous diet after the procedure.  ? If you receive sedation or narcotic pain medications, avoid any foods or beverages containing alcohol for at least 24 hours after the procedure.  ? Please see the Medication Reconciliation sheet for instructions regarding resuming your home medications.?  CALL THE DOCTOR IF:  ? Bright red blood soaks the bandage.  ? You have pain not relieved by medication.? Some soreness at the site is to be expected.  ? You have signs of infection such as: fever greater than 101F, chills, redness, warmth, swelling, drainage or pus from the puncture site.?  For any of the above symptoms or for problems or concerns related to the procedure, call?[phone] Monday-Friday from 7-5p.? After-hours and weekends, please call?[phone] and ask for the Interventional Radiology Resident on-call.  You or your caregiver should call 911 for any severe symptoms such as excessive bleeding, severe dizziness, trouble breathing or loss of consciousness.   ?

## 2019-07-18 ENCOUNTER — Ambulatory Visit: Admit: 2019-07-18 | Discharge: 2019-07-18 | Payer: MEDICARE

## 2019-07-18 ENCOUNTER — Encounter: Admit: 2019-07-18 | Discharge: 2019-07-18 | Payer: MEDICARE

## 2019-07-18 DIAGNOSIS — K219 Gastro-esophageal reflux disease without esophagitis: Secondary | ICD-10-CM

## 2019-07-18 DIAGNOSIS — C801 Malignant (primary) neoplasm, unspecified: Secondary | ICD-10-CM

## 2019-07-18 DIAGNOSIS — M549 Dorsalgia, unspecified: Secondary | ICD-10-CM

## 2019-07-18 DIAGNOSIS — G35 Multiple sclerosis: Secondary | ICD-10-CM

## 2019-07-18 DIAGNOSIS — E785 Hyperlipidemia, unspecified: Secondary | ICD-10-CM

## 2019-07-18 DIAGNOSIS — S92902A Unspecified fracture of left foot, initial encounter for closed fracture: Secondary | ICD-10-CM

## 2019-07-18 DIAGNOSIS — M1990 Unspecified osteoarthritis, unspecified site: Secondary | ICD-10-CM

## 2019-07-18 DIAGNOSIS — R911 Solitary pulmonary nodule: Secondary | ICD-10-CM

## 2019-07-18 DIAGNOSIS — G629 Polyneuropathy, unspecified: Secondary | ICD-10-CM

## 2019-07-18 DIAGNOSIS — S62109A Fracture of unspecified carpal bone, unspecified wrist, initial encounter for closed fracture: Secondary | ICD-10-CM

## 2019-07-18 DIAGNOSIS — Z9221 Personal history of antineoplastic chemotherapy: Secondary | ICD-10-CM

## 2019-07-18 DIAGNOSIS — I2510 Atherosclerotic heart disease of native coronary artery without angina pectoris: Secondary | ICD-10-CM

## 2019-07-18 DIAGNOSIS — I10 Essential (primary) hypertension: Secondary | ICD-10-CM

## 2019-07-18 DIAGNOSIS — R1314 Dysphagia, pharyngoesophageal phase: Secondary | ICD-10-CM

## 2019-07-18 DIAGNOSIS — E041 Nontoxic single thyroid nodule: Secondary | ICD-10-CM

## 2019-07-18 NOTE — Progress Notes
Date of Service: 07/18/2019    Subjective:             Rachael Black is a 74 y.o. female.    History of Present Illness      Rachael Black is referred by Rachael Black.    05/03/19 Fall and ED eval, CT detected Thyroid mass  left FNA benign 06/2019    voice comes and goes  Has chew things up small and food getting caught in lower part of the next  Has to chase things down w water frequently    She denies a history of neck/thyroid mass, breathing troubles, neck tightness/pain, rapid mass growth, external visibility.    Rachael Black has received previous treatment and workup including:  TSH:   Lab Results   Component Value Date    TSH 1.50 05/05/2019     [place] T:   06/25/19   US THYROID    Narrative    ULTRASOUND OF THE NECK    Clinical Indication:Female, 73 years;  thyroid nodule    Technique: Multiple grayscale sonographic images were obtained of the neck with additional Color Doppler acquisitions.    Comparison: CT chest 05/03/2019     FINDINGS:    The isthmus is normal in thickness without a discrete nodule.    The right lobe of the thyroid measures 4.7 x 1.6 x 1.2 cm.  The right lobe parenchyma is homogenous. A 4 mm benign colloid cyst is noted at the upper pole (TR1).    The left lobe of the thyroid measures 5.0 x 2.4 x 2.4 cm. One nodule is measured. The remainder of the left lobe is homogeneous.  *  Mid left lobe. Mixed cystic and solid, isoechoic, smooth margins, macrocalcifications and  thin rim calcifications. Round shape. Measures 2.8 x 2.3 x 1.8 cm. TR4.    Normal size bilateral cervical lymph nodes are noted, which are most likely reactive in nature. No enlarged or morphologically suspicious anterior cervical lymph nodes are seen.       Impression    Solitary left thyroid nodule which corresponds to the findings on recent CT chest. This nodule demonstrate intermediate suspicion ultrasound features. Percutaneous biopsy is recommended for tissue diagnosis.    ACR TI-RADS assessment categories  TR1: Benign  TR2: Not suspicious   TR3: Mildly suspicious  TR4: Moderately suspicious  TR5: Highly suspicious    [email or web address]    By my electronic signature, I attest that I have personally reviewed the images for this examination and formulated the interpretations and opinions expressed in this report       Finalized by Judie Petit, M.D. on 06/25/2019 1:33 PM. Dictated by Denny Peon, M.D. on 06/25/2019 10:06 AM.       CTs: 05/03/19 CT CA:  Chest:     1. No acute thoracic fracture is identified. Multiple subacute to chronic   appearing bilateral rib fractures.   2. Multiple noncalcified bilateral pulmonary nodules measuring up to 5 mm.   These are indeterminate for benign etiology such as noncalcified   granulomas (given the presence of calcified granulomas) versus pulmonary   metastases in a patient with history of breast cancer. Benign etiology is   favored though a follow-up CT chest is recommended in 3 months to begin to   show longer-term stability in the absence of outside exam showing   long-term stability.   3. Large hiatal hernia with some adjacent mild to moderate compressive   atelectasis in the left lower  lobe. Additional areas of mild bilateral   scarring and atelectasis in the bilateral lungs without definite pulmonary   contusion or focal pneumonia. Some areas of mild mosaic attenuation   bilaterally are likely related to mild small airways disease rather than   small vessel disease.   4. Moderate coronary artery calcifications.   5. Hypodense left thyroid lesions, the largest measuring up to 1.9 cm.   These are incompletely characterized and a nonemergent thyroid ultrasound   is recommended for better characterization, if not previously worked up.     Abdomen and Pelvis:     1. Comminuted and minimally displaced fracture of the right superior pubic   ramus and right pubis. Nondisplaced acute fracture of the right inferior   pubic ramus is also noted. There is mild associated extraperitoneal   hemorrhage, right greater than left, with mild enlargement of the right   obturator internus possibly related to muscular edema and/or mild   intramuscular hemorrhage. No acetabular fracture or diastases at the pubic   symphysis or sacroiliac joints.   2. Mild subcutaneous contusion lateral to the right hip.   3. No major abdominal or pelvic visceral injury.   4. Moderate left convexity curvature lumbar spine multilevel degenerative   changes and mild chronic appearing loss of height at the L1 superior   endplate. No acute lumbar fracture.   5. Cholelithiasis without acute cholecystitis.   6. Mild bilateral renal atrophy.   FNA: 07/04/19  A. Thyroid, Left, US-FNA: ?   Benign   Consistent with a benign follicular nodule (adenomatoid nodule, colloid   nodule, nodular goiter)   Thyroid scan: No results found for this or any previous visit.  Vitamin D:   Vitamin D(25-OH)Total   Date Value Ref Range Status   06/14/2019 33.9 30 - 80 NG/ML Final     CXR: See ct chest    Family history of thyroid malignancy: No   History of Cowden's Dz (hamartoma syndrome), FAP - Familial Adenomatous Polyposis, or MEN syndrome: No  History of neck radiation exposure: Breast Ca EBRT  Hx esoph erosions and ulcer many years ago, bleeding. No recent issues  Endocrinologist: NA    She denies a history of OSA, chest pain, MI, CVA, Sz, DOE or DM. She can walk three blocks. Rachael Black works as retired.  Has mild MS  Past Surgical History:   Surgical History:   Procedure Laterality Date   ? HX RHINOPLASTY  1974   ? HX TUBAL LIGATION  1976   ? ANKLE SURGERY Right 2000    with instrumentation   ? Left radioactive seed localized lumpectomy, left axillary sentinel lymph node biopsy  Left 12/14/2015    Performed by Rachael Poche, MD at [facility]   ? COLONOSCOPY     ? FOOT SURGERY       Other medical history:   Medical History:   Diagnosis Date   ? Arthritis    ? Back pain    ? Cancer (HCC) 06/2015    Breast    ? Foot fracture, left 09/26/2016   ? GERD (gastroesophageal reflux disease)    ? History of chemotherapy     neoadjuvant chemotherapy 2017   ? HLD (hyperlipidemia)    ? Hypertension    ? Multiple sclerosis (HCC) 1990   ? Neuropathy    ? Wrist fracture 09/26/2016    heads of ulna and radius     Smoking and drinking status: Social ETOH, non smoker  COVID Vaccination Complete: Yes  Date COVID Vaccination Completed: 04/2019       Review of Systems   Constitutional: Negative.    HENT: Positive for rhinorrhea and voice change.    Eyes: Negative.    Respiratory: Positive for choking.    Cardiovascular: Negative.    Gastrointestinal: Negative.    Endocrine: Negative.    Genitourinary: Negative.    Musculoskeletal: Positive for arthralgias and back pain.   Skin: Negative.    Allergic/Immunologic: Negative.    Neurological: Negative.    Hematological: Negative.    Psychiatric/Behavioral: Negative.          Objective:         ? acetaminophen SR(+) (TYLENOL) 650 mg tablet Take 1,950 mg by mouth twice daily.   ? alendronate (FOSAMAX) 70 mg tablet Take one tablet by mouth every 7 days. Take at least 30 minutes before breakfast with plain water. Do not lie down for 30 minutes.   ? gabapentin (NEURONTIN) 300 mg capsule Take 3 caps twice a day.  Indications: neuropathic pain   ? pantoprazole DR (PROTONIX) 20 mg tablet Take one tablet by mouth daily.   ? rosuvastatin (CRESTOR) 10 mg tablet Take one tablet by mouth daily.     Vitals:    07/18/19 0933 07/18/19 0939   BP: (!) 153/90 (!) 148/88   Pulse: 78 82   Weight: 81.2 kg (179 lb)    Height: 165.1 cm (65)      Body mass index is 29.79 kg/m?Marland Kitchen     Physical Exam  Alert, NAD  Head: Normocephalic/atraumatic overweight  Ears: AU Auricles without lesions, EAC clear AU, hearing grossly intact AU  Eyes: EOMs equal OU, PERRL, vision grossly intact OU, conjunctiva clear  Nose: Externally without lesions, septum midline, no visible lesions via anterior rhinoscopy  OC/OP: Teeth in good repair, tongue movement and sensation intact, no mass or mucosal lesions in OC or OP visibly or palpably, tonsil fossa symmetric  Larynx: Voice normal without stridor or sturgor, normal external landmarks  Neck: No neck mass or adenopathy, no palpable thyroid masses, no cutaneous changes  No fixation to skin and normal thyroid bed mobility  CNs: Facial sensation and movement intact bilaterally, shoulder shrug normal bilaterally    After obtaining verbal consent and due to strong gag reflex/swallowing problems the nose was pledgeted with 4% lidocaine and neosynephrine. I looked with the flexible fiberoptic laryngoscope. The nasal anatomy is normal without mass or mucosal lesion. The laryngoscope was then passed into the nasopharynx, which showed normal eustachian tube openings.  The fossae of Rosenm?ller were clear with normal elevation of the soft palate and were without any evidence of masses or lesions.  Passing the flexible scope into the oropharynx and hypopharynx revealed that the base of tongue and vallecula were without lesions.  The piriform sinuses were without lesions or any pooling of secretions or visible aspiration.  The supraglottic structures are without lesions, including the epiglottis and aryepiglottic folds. The false vocal cords and true vocal cords were without lesions. The TVC were symmetric and mobile bilaterally, without mucosal lesions. The visualized part of the subglottis is clear.  There was no extrinsic mass effects in the pharynx. The flexible fiberoptic scope was then removed.  The patient tolerated the procedure well without complications.          Assessment and Plan:    1. 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 We  discussed the risks and merits of each Tx approach including serial exams, serial [place], FNA, Molecular testing, Thyroid Scan and surgery. Reviewed interval studies and notes. Hx 2017 breast EBRT    Rachael Black  wishes to proceed with serial exam.  US Thyroid with fu 9 months    2. Dysphagia with food catching and small bites. Thyroid upper limit normal size. Does have mild MS   Video swallow and speech eval/laryngology    3. Lung nodules followed by PCP and serial scans set up    rec cardiology wu given calc on ct    If surg:  Biospecimen Repository  PAT clinic eval  Schedule  COVID Testing  Cardiology Clearance - mod calc on ct chest

## 2019-07-18 NOTE — Patient Instructions
Please call [phone] to schedule your cardiology appointment with Dr. [name].

## 2019-07-19 ENCOUNTER — Encounter: Admit: 2019-07-19 | Discharge: 2019-07-19 | Payer: MEDICARE

## 2019-07-26 ENCOUNTER — Encounter: Admit: 2019-07-26 | Discharge: 2019-07-26 | Payer: MEDICARE

## 2019-07-26 DIAGNOSIS — S32591A Other specified fracture of right pubis, initial encounter for closed fracture: Secondary | ICD-10-CM

## 2019-07-30 ENCOUNTER — Ambulatory Visit: Admit: 2019-07-30 | Discharge: 2019-07-30 | Payer: MEDICARE

## 2019-07-30 ENCOUNTER — Encounter: Admit: 2019-07-30 | Discharge: 2019-07-30 | Payer: MEDICARE

## 2019-07-30 DIAGNOSIS — S32591A Other specified fracture of right pubis, initial encounter for closed fracture: Secondary | ICD-10-CM

## 2019-07-30 DIAGNOSIS — M549 Dorsalgia, unspecified: Secondary | ICD-10-CM

## 2019-07-30 DIAGNOSIS — E785 Hyperlipidemia, unspecified: Secondary | ICD-10-CM

## 2019-07-30 DIAGNOSIS — Z9221 Personal history of antineoplastic chemotherapy: Secondary | ICD-10-CM

## 2019-07-30 DIAGNOSIS — C801 Malignant (primary) neoplasm, unspecified: Secondary | ICD-10-CM

## 2019-07-30 DIAGNOSIS — M1990 Unspecified osteoarthritis, unspecified site: Secondary | ICD-10-CM

## 2019-07-30 DIAGNOSIS — M25511 Pain in right shoulder: Secondary | ICD-10-CM

## 2019-07-30 DIAGNOSIS — G629 Polyneuropathy, unspecified: Secondary | ICD-10-CM

## 2019-07-30 DIAGNOSIS — S92902A Unspecified fracture of left foot, initial encounter for closed fracture: Secondary | ICD-10-CM

## 2019-07-30 DIAGNOSIS — I10 Essential (primary) hypertension: Secondary | ICD-10-CM

## 2019-07-30 DIAGNOSIS — K219 Gastro-esophageal reflux disease without esophagitis: Secondary | ICD-10-CM

## 2019-07-30 DIAGNOSIS — G35 Multiple sclerosis: Secondary | ICD-10-CM

## 2019-07-30 DIAGNOSIS — S62109A Fracture of unspecified carpal bone, unspecified wrist, initial encounter for closed fracture: Secondary | ICD-10-CM

## 2019-08-07 ENCOUNTER — Encounter: Admit: 2019-08-07 | Discharge: 2019-08-07 | Payer: MEDICARE

## 2019-08-07 ENCOUNTER — Ambulatory Visit: Admit: 2019-08-07 | Discharge: 2019-08-07 | Payer: MEDICARE

## 2019-08-07 DIAGNOSIS — R911 Solitary pulmonary nodule: Secondary | ICD-10-CM

## 2019-08-07 LAB — POC CREATININE, RAD: Lab: 0.8 mg/dL (ref 0.4–1.00)

## 2019-08-07 MED ORDER — IOHEXOL 350 MG IODINE/ML IV SOLN
70 mL | Freq: Once | INTRAVENOUS | 0 refills | Status: CP
Start: 2019-08-07 — End: ?
  Administered 2019-08-07: 15:00:00 70 mL via INTRAVENOUS

## 2019-08-07 MED ORDER — SODIUM CHLORIDE 0.9 % IJ SOLN
50 mL | Freq: Once | INTRAVENOUS | 0 refills | Status: CP
Start: 2019-08-07 — End: ?
  Administered 2019-08-07: 15:00:00 50 mL via INTRAVENOUS

## 2019-08-14 ENCOUNTER — Encounter: Admit: 2019-08-14 | Discharge: 2019-08-14 | Payer: MEDICARE

## 2019-08-14 NOTE — Progress Notes
I spoke briefly with Rachael Black.  She was referred by Dr. Royce Macadamia due to coronary calcifications seen on a CT chest at [facility].  She has never seen a cardiologist before.  She did have an echocardiogram in 2017 prior to chemotherapy for breast cancer.      Patient's PCP is with [facility], so all records are up to date.  Updated problem list with 2017 echo results.

## 2019-08-19 ENCOUNTER — Ambulatory Visit: Admit: 2019-08-19 | Discharge: 2019-08-19 | Payer: MEDICARE

## 2019-08-19 ENCOUNTER — Encounter: Admit: 2019-08-19 | Discharge: 2019-08-19 | Payer: MEDICARE

## 2019-08-19 DIAGNOSIS — R1314 Dysphagia, pharyngoesophageal phase: Secondary | ICD-10-CM

## 2019-08-19 MED ORDER — BARIUM SULFATE 40 % (W/V) PO SUSP
10 mL | Freq: Once | ORAL | 0 refills | Status: CP
Start: 2019-08-19 — End: ?
  Administered 2019-08-19: 15:00:00 20 mL via ORAL

## 2019-08-19 MED ORDER — BARIUM SULFATE 40 % (W/V), 30% (W/W) PO PSTE
10 mL | Freq: Once | ORAL | 0 refills | Status: CP
Start: 2019-08-19 — End: ?
  Administered 2019-08-19: 15:00:00 10 mL via ORAL

## 2019-08-19 MED ORDER — BARIUM SULFATE 40 % (W/V) 29% (W/W) PO SUSP
10 mL | Freq: Once | ORAL | 0 refills | Status: DC
Start: 2019-08-19 — End: 2019-08-24

## 2019-08-19 MED ORDER — BARIUM SULFATE 81 % (W/W) PO POWD
10 mL | Freq: Once | ORAL | 0 refills | Status: CP
Start: 2019-08-19 — End: ?
  Administered 2019-08-19: 15:00:00 60 mL via ORAL

## 2019-08-21 ENCOUNTER — Encounter: Admit: 2019-08-21 | Discharge: 2019-08-21 | Payer: MEDICARE

## 2019-08-21 ENCOUNTER — Ambulatory Visit: Admit: 2019-08-21 | Discharge: 2019-08-22 | Payer: MEDICARE

## 2019-08-21 DIAGNOSIS — I10 Essential (primary) hypertension: Secondary | ICD-10-CM

## 2019-08-21 DIAGNOSIS — K219 Gastro-esophageal reflux disease without esophagitis: Secondary | ICD-10-CM

## 2019-08-21 DIAGNOSIS — C50912 Malignant neoplasm of unspecified site of left female breast: Secondary | ICD-10-CM

## 2019-08-21 DIAGNOSIS — S92902A Unspecified fracture of left foot, initial encounter for closed fracture: Secondary | ICD-10-CM

## 2019-08-21 DIAGNOSIS — G35 Multiple sclerosis: Secondary | ICD-10-CM

## 2019-08-21 DIAGNOSIS — M549 Dorsalgia, unspecified: Secondary | ICD-10-CM

## 2019-08-21 DIAGNOSIS — S32591A Other specified fracture of right pubis, initial encounter for closed fracture: Secondary | ICD-10-CM

## 2019-08-21 DIAGNOSIS — Z9221 Personal history of antineoplastic chemotherapy: Secondary | ICD-10-CM

## 2019-08-21 DIAGNOSIS — M1990 Unspecified osteoarthritis, unspecified site: Secondary | ICD-10-CM

## 2019-08-21 DIAGNOSIS — C50112 Malignant neoplasm of central portion of left female breast: Secondary | ICD-10-CM

## 2019-08-21 DIAGNOSIS — E785 Hyperlipidemia, unspecified: Secondary | ICD-10-CM

## 2019-08-21 DIAGNOSIS — S62109A Fracture of unspecified carpal bone, unspecified wrist, initial encounter for closed fracture: Secondary | ICD-10-CM

## 2019-08-21 DIAGNOSIS — G629 Polyneuropathy, unspecified: Secondary | ICD-10-CM

## 2019-08-21 DIAGNOSIS — E782 Mixed hyperlipidemia: Secondary | ICD-10-CM

## 2019-08-21 DIAGNOSIS — C801 Malignant (primary) neoplasm, unspecified: Secondary | ICD-10-CM

## 2019-08-21 DIAGNOSIS — Z136 Encounter for screening for cardiovascular disorders: Secondary | ICD-10-CM

## 2019-08-21 DIAGNOSIS — G620 Drug-induced polyneuropathy: Secondary | ICD-10-CM

## 2019-08-21 NOTE — Patient Instructions
[name],    Please start taking a baby aspirin every day.    Please check your blood pressure at home at least once daily.  Keep a blood pressure log so we can review it when I call with test results.    Stop by scheduling and set up an echo and stress test on a day that works for you.    If you have any questions in the meantime, please call Varney Baas [phone] or send a MyChart message.  Thank you!         [facility] Nuclear Stress Test Instructions    PLEASE REPORT TO:    ____[facility] ([address], [address], [place], [place]) - [phone]   ____[facility] ([address], [address], [place], [place]) - [phone]    ____[facility] ([address] [place], [place]) - [phone]    ____[facility] ([address], [address], [place], [place]) - [phone]   ____[facility] ([address] [place], [place]) - [phone]     [facility] Main Phone Number: [phone]     Date of Test  ____________  at ____________  for ________________________    Are you able to raise your arm up by your head for about 20 minutes? yes  Can you lie on your back for approximately 20 minutes with minimal movement? yes    The Thallium evaluation has two parts -- two nuclear scans.  The first scan is done in the morning and the second three to four hours later.   Wear comfortable clothing. Shorts or pants. (No dresses or skirts please).  Bring or wear sneakers/walking shoes if you are walking on [place].  Please let the nuclear technologists know if you plan on flying after the test.    NO CAFFEINE 24 HOURS PRIOR TO TEST. Examples: coffee, tea, decaf drinks, cola, chocolate.     DO NOT EAT OR DRINK THE MORNING OF YOUR TEST unless otherwise instructed. (You may have a couple sips of water).    If you are a diabetic, if insulin dependent: please take one third of your insulin with two pieces of dry toast and a small juice). Bring insulin and medication with you to the test.     ___ TAKE MORNING MEDICATIONS WITH A COUPLE SIPS OF WATER PRIOR TO TEST.     HOLD THE FOLLOWING MEDICATIONS AS INDICATED BELOW:      ? over the counter medications the morning of your procedure.       WHAT TO DO BETWEEN THE FIRST TWO THALLIUM SCANS:  1. No strenuous exercise should be performed during this time.  2. A light lunch is permissible. The technologist will give you a list of appropriate foods.  3. Please return 15 minutes prior to the schedule of your second scan. Our nuclear technologist will tell you exactly what time to return.  4. Please do not use tobacco products in between scans.  5. After the first scan is completed, you may resume usual medications.     TEST FINDINGS:  You will receive the results of the test within 7 business days of its completion by telephone, unless arranged differently at the time of the procedure.  If you have any questions concerning your thallium test or if you do not hear from your [facility] physician/or nurse within 7 business days, please call the appropriate office checked above.      Instructions given by Arlyce Dice, RN

## 2019-08-21 NOTE — Progress Notes
Date of Service: 08/21/2019    Rachael Black is a 74 y.o. female.       HPI     Rachael Black was seen today in the Cardiovascular Medicine Clinic at [facility] at our [place], [place] office.    I had the very nice pleasure of meeting Rachael Black today as part of initial cardiovascular evaluation after being found to have coronary artery calcification on CT of the chest as noted below.     Dictation on: 08/21/2019 12:50 PM by: [name] [RFERRELL]       CT CHEST 08/07/2019:  1. Stable small pulmonary nodules. Consider follow up CT chest in 6 months (or according to clinical protocol).   2. No new or enlarging pulmonary nodule or thoracic lymphadenopathy to suggest metastatic disease.   3. Redemonstration of large hiatal hernia with intrathoracic stomach.   4. Coronary artery calcification.      05/04/2019 04:15   Cholesterol 186   Triglycerides 125   HDL 55   LDL 112 (H)   VLDL 25   Non HDL Cholesterol 131          Vitals:    08/21/19 1007   BP: (!) 162/102   BP Source: Arm, Right Upper   Patient Position: Sitting   Pulse: 72   SpO2: 97%   Weight: 80.7 kg (178 lb)   Height: 1.651 m (5' 5)   PainSc: Three     Body mass index is 29.62 kg/m?Marland Kitchen     Past Medical History  Patient Active Problem List    Diagnosis Date Noted   ? Dysphagia, pharyngoesophageal phase 07/18/2019   ? Multiple sclerosis (HCC) 06/14/2019   ? GERD (gastroesophageal reflux disease) 06/14/2019   ? HLD (hyperlipidemia) 06/14/2019   ? Menopause 06/14/2019   ? Pulmonary nodule 06/14/2019   ? Thyroid nodule 06/14/2019   ? Coronary artery disease involving native coronary artery of native heart without angina pectoris 06/14/2019     05/03/19 and 08/07/19 CT Chest @ [facility] - moderate coronary artery calcifications seen  07/06/15 - echocardiogram @ [facility] - LVEF 50-55%, mild concentric LVH, trace tricuspid regurgitation     ? Macrocytosis 06/14/2019   ? Vitamin D deficiency 06/14/2019   ? Neuropathy 06/14/2019   ? Spinal stenosis of lumbar region without neurogenic claudication 06/14/2019   ? Other idiopathic scoliosis, thoracolumbar region 06/14/2019   ? Trauma 05/03/2019   ? Pubic ramus fracture (HCC) 05/03/2019   ? Pubic ramus fracture, right, sequela 05/03/2019   ? 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 Dislocation of tarsometatarsal joint of left foot 04/17/2018   ? Iron deficiency anemia 07/30/2017   ? B12 deficiency 07/30/2017   ? GI bleed 07/28/2017   ? 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 Breast cancer, left breast (HCC) 10/03/2015     Added automatically from request for surgery [phone]     ? Anemia associated with chemotherapy 09/28/2015   ? 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 Chronic fatigue 09/28/2015   ? Encounter for education 07/13/2015   ? Anxiety 07/13/2015   ? 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 [name] felt a lump in the middle of April 2017. She feels it is about the size of a quarter. She denies any nipple discharge, skin change or pain. She has not had imaging since 2015.  Ultrasound guided biopsy on 06/25/15 revealed grade 3 IDC.  [name] completed neoadjuvant ddAC + Taxol on 11/30/15.  [name] underwent left RSL lumpectomy/SLNB on 12/14/2015.  Final pathology revealed a complete pathologic response with 0/4 lymph nodes.  She completed radiation therapy with Dr. Janee Morn on 02/09/16.      BREAST IMAGING:  Mammogram:    -- Bilateral screening mammogram 01/02/14 [facility]) revealed scattered fibroglandular densities. There was no suspicious mass or architectural distortion.  -- Bilateral diagnostic mammogram 06/23/15 ([facility]) revealed scattered fibroglandular densities. There was a dense, irregular, 2 cm mass at the patient's area of palpable concern in the upper left breast, 12:00 position, anterior to middle depth. No new suspicious abnormality was seen within the right breast on mammogram.  Ultrasound:  -- Targeted left breast ultrasound 06/23/15 ([facility]) revealed at 12:00, 5 cm from the nipple, demonstrated a 2.1 x 1.6 x 1.9 cm irregular, not parallel, hypoechoic mass with angular margins and internal blood flow. There may be intraductal extension from this mass towards the nipple. No suspicious left axillary or left parasternal lymph nodes were identified.  MRI:  -- Bilateral breast MRI 07/23/15 ([facility]) revealed no area of suspicious enhancement in the right breast. No adenopathy, skin or nipple abnormalities were identified. Within the left breast superiorly at approximately 12:00 there was the known malignancy measuring 1.6 cm. 3 cm posterior and inferior to the mass within the middle depth central breast slightly medial, there was a 4 mm circumscribed enhancing mass which was stable on multiple prior mammograms dating back with June 2012. No skin or nipple abnormalities were present. No axillary abnormalities were present.    REPRODUCTIVE HEALTH:  Age at first Menarche:  71  Age at First Live Birth:  76  Age at Menopause:  32, took HRT x 2 years  Gravida:  2  Para: 2  Breastfeeding:  No    PROCEDURE:   1.  Right breast cyst excision, 1990s  2.  Left RSL lumpectomy/SLNB, 12/14/2015 [name])  PERTINENT PMH: Multiple sclerosis, HTN, HLD, GERD   FAMILY HISTORY:  No family history of breast cancer  PHYSICAL EXAM on PRESENTATION:  Left - 2.5 cm lump at 12:00, 5 cm FTN. No skin or nipple changes. Right - No palpable breast masses. No skin, nipple, or areolar change. No supraclavicular or axillary adenopathy.    MEDICAL ONCOLOGY: Dr. Stasia Cavalier NEOADJUVANT THERAPY:  Neoadjuvant ddAC + Taxol 07/20/15 - 11/23/15 PRESENT THERAPY: Observation  REFERRED BY:  Dr. Merian Capron    TREATMENT HISTORY:   Dose dense AC 522/17 - 08/31/15  Weekly Taxol 09/14/15        I reviewed and confirmed this patient's problem list, active medications, allergies, and past medical, social, family & tobacco histories.       Review of Systems   Constitution: Positive for malaise/fatigue.   HENT: Negative.    Eyes: Negative.    Cardiovascular: Positive for dyspnea on exertion.   Respiratory: Negative.    Endocrine: Positive for heat intolerance.   Hematologic/Lymphatic: Bruises/bleeds easily.   Skin: Negative.    Musculoskeletal: Positive for arthritis, falls and joint pain.   Gastrointestinal: Negative.    Genitourinary: Negative.    Neurological: Positive for loss of balance, numbness and paresthesias.  Psychiatric/Behavioral: Negative.    Allergic/Immunologic: Positive for environmental allergies.       Physical Exam  General Appearance: no acute distress  HEENT: EOMI, mucous membranes moist, oropharynx is clear  Neck Veins: neck veins are flat & not distended  Carotid Arteries: no bruits  Chest Inspection: chest is normal in appearance  Auscultation/Percussion: lungs clear to auscultation, no rales, rhonchi, or wheezing  Cardiac Rhythm: regular rhythm & normal rate  Cardiac Auscultation: Normal S1 & S2, no S3 or S4, no rub  Murmurs: no cardiac murmurs  Abdominal Exam: soft, non-tender, normal bowel sounds, no masses or bruits  Abdominal aorta: nonpalpable   Liver & Spleen: no organomegaly  Extremities: no lower extremity edema; palpable distal pulses  Skin: warm & intact  Neurologic Exam: oriented to time, place and person; no focal neurologic deficits        Assessment and Plan     74 y.o. female with the following medical problems:    ? CAD based on moderate coronary artery calcification identified on CT chest.  ? Former tobacco use.  ? Essential hypertension.   ? Hyperlipidemia.  ? Left breast cancer s/p chemoradiation therapy.      Dictation on: 08/21/2019 12:55 PM by: [name] [RFERRELL]            Current Medications (including today's revisions)  ? acetaminophen SR(+) (TYLENOL) 650 mg tablet Take 1,950 mg by mouth twice daily.   ? alendronate (FOSAMAX) 70 mg tablet Take one tablet by mouth every 7 days. Take at least 30 minutes before breakfast with plain water. Do not lie down for 30 minutes.   ? cholecalciferol (VITAMIN D-3) 1,000 units tablet Take 1,000 Units by mouth daily.   ? docusate (COLACE) 100 mg capsule Take 100 mg by mouth daily.   ? gabapentin (NEURONTIN) 300 mg capsule Take 3 caps twice a day.  Indications: neuropathic pain   ? pantoprazole DR (PROTONIX) 20 mg tablet Take one tablet by mouth daily.   ? rosuvastatin (CRESTOR) 10 mg tablet Take one tablet by mouth daily.           Total Visit time: Total of 46 minutes were spent on the same day of the visit including preparing to see the patient, obtaining and/or reviewing separately obtained history, performing a medically appropriate examination and/or evaluation, review of outside records, face-to-face in person visit with patient & spouse including counseling & educating, review of labs, radiology & independently interpreting results including echocardiogram & other cardiovascular studies, ordering medications, tests or procedures, formulation of treatment plan & care coordination, referring & communication with other health care professionals, after visit summary, future disposition, and documenting clinical information in the electronic or other health record.

## 2019-08-22 DIAGNOSIS — R1314 Dysphagia, pharyngoesophageal phase: Secondary | ICD-10-CM

## 2019-08-22 DIAGNOSIS — I2584 Coronary atherosclerosis due to calcified coronary lesion: Secondary | ICD-10-CM

## 2019-08-22 DIAGNOSIS — I2510 Atherosclerotic heart disease of native coronary artery without angina pectoris: Secondary | ICD-10-CM

## 2019-08-22 DIAGNOSIS — I10 Essential (primary) hypertension: Secondary | ICD-10-CM

## 2019-08-27 ENCOUNTER — Encounter: Admit: 2019-08-27 | Discharge: 2019-08-27 | Payer: MEDICARE

## 2019-08-27 MED ORDER — TELMISARTAN 20 MG PO TAB
20 mg | ORAL_TABLET | Freq: Every day | ORAL | 3 refills | 90.00000 days | Status: AC
Start: 2019-08-27 — End: ?

## 2019-08-27 NOTE — Telephone Encounter
The pt left a message on the nurse line stating her BP has been elevated since the office visit on 08/21/19, running in the 150/100 range. She states she is going out of town and is wondering if she should restart the micardis 20 mg daily. I returned the call to the pt. She states she took Micardis 20 mg daily about 4 years ago when she had breast cancer but then her BP dropped and they took her off the Micardis. She states her BP has consistently been running high since she saw Dr. Jacqulyn Ducking last week. [place] and would like [place] to send the prescription to [facility]. I informed [name] that I will review with another provider since Dr. Jacqulyn Ducking is out of the office this week. The pt is scheduled for an Echocardiogram and nuclear stress test on 10/01/19.     Dr. Allyn Kenner 08/21/19 office visit notes-  Her blood pressure was elevated at 162/102 mmHg.  She previously had been on Micardis for treatment of hypertension, but she has been off this now for a few years. Apparently, she had developed symptomatic hypotension at 1 point during her treatment for breast cancer.  I have asked her to keep an ambulatory blood pressure log.  I would like to check an echocardiogram to assess underlying cardiac structure and function and rule out hypertensive heart disease.  I explained to her and her husband that I have a very low threshold with regard to restarting the Micardis.  In addition to treatment of hypertension, this would also be useful in secondary prevention of CAD.

## 2019-08-27 NOTE — Telephone Encounter
Adonis Huguenin, MD  Rich Fuchs, RN   Caller: Unspecified (Today, ?8:12 AM)   We can absolutely do that.    Thanks,  JT       ?   Previous Messages    ?  ----- Message -----   From: Rich Fuchs, RN   Sent: 08/27/2019 ? 8:23 AM CDT   To: Adonis Huguenin, MD     ----- Message from Rich Fuchs, RN sent at 08/27/2019 ?8:23 AM CDT -----   Dr. Maisie Fus, could you please review in Dr. Allyn Kenner absence this week. Do you approve the pt restarting the Micardis 20 mg daily? Thank you

## 2019-08-27 NOTE — Telephone Encounter
I called the pt and reviewed Dr. Maisie Fus recommendations to start taking the Micardis 20 mg daily. She will monitor her BP and follow up with readings in 2 weeks.

## 2019-09-10 ENCOUNTER — Ambulatory Visit: Admit: 2019-09-10 | Discharge: 2019-09-10 | Payer: MEDICARE

## 2019-09-10 ENCOUNTER — Encounter: Admit: 2019-09-10 | Discharge: 2019-09-10 | Payer: MEDICARE

## 2019-09-10 DIAGNOSIS — M1990 Unspecified osteoarthritis, unspecified site: Secondary | ICD-10-CM

## 2019-09-10 DIAGNOSIS — M549 Dorsalgia, unspecified: Secondary | ICD-10-CM

## 2019-09-10 DIAGNOSIS — Z9221 Personal history of antineoplastic chemotherapy: Secondary | ICD-10-CM

## 2019-09-10 DIAGNOSIS — K219 Gastro-esophageal reflux disease without esophagitis: Secondary | ICD-10-CM

## 2019-09-10 DIAGNOSIS — I10 Essential (primary) hypertension: Secondary | ICD-10-CM

## 2019-09-10 DIAGNOSIS — S32591A Other specified fracture of right pubis, initial encounter for closed fracture: Secondary | ICD-10-CM

## 2019-09-10 DIAGNOSIS — E785 Hyperlipidemia, unspecified: Secondary | ICD-10-CM

## 2019-09-10 DIAGNOSIS — M67911 Unspecified disorder of synovium and tendon, right shoulder: Secondary | ICD-10-CM

## 2019-09-10 DIAGNOSIS — C801 Malignant (primary) neoplasm, unspecified: Secondary | ICD-10-CM

## 2019-09-10 DIAGNOSIS — G629 Polyneuropathy, unspecified: Secondary | ICD-10-CM

## 2019-09-10 DIAGNOSIS — S92902A Unspecified fracture of left foot, initial encounter for closed fracture: Secondary | ICD-10-CM

## 2019-09-10 DIAGNOSIS — G35 Multiple sclerosis: Secondary | ICD-10-CM

## 2019-09-10 DIAGNOSIS — S62109A Fracture of unspecified carpal bone, unspecified wrist, initial encounter for closed fracture: Secondary | ICD-10-CM

## 2019-09-10 NOTE — Progress Notes
CC: Follow up visit  ?  HPI:  Doing well.  Using a cane at times.  Shoulder has improved in therapy.  It still causes her frequent discomfort but she is learning to live with it. ?    Pert pos: Pain R shoulder  Pert neg: fever, chills. Numbness or tingling RUE  Otherwise comprehensive 12pt ROS reviewed and negative    Physical Exam   Height 165.1 cm (65), weight 81.2 kg (179 lb).   Body mass index is 29.79 kg/m?.  ?  Physical Exam   Constitutional: She is oriented to person, place, and time and well-developed, well-nourished, and in no distress.   HENT:   Head: Normocephalic.   Eyes: Pupils are equal, round, and reactive to light.   Cardiovascular: Intact distal pulses.   Pulmonary/Chest: Effort normal.   Musculoskeletal:      Cervical back: Normal range of motion.   Neurological: She is alert and oriented to person, place, and time.   Skin: Skin is warm and dry.   Psychiatric: Affect normal.   Vitals reviewed.      No tenderness about bilateral groins.  +EHL/PF/DF, 2+DPP, SILT bilaterally. 5/5 hip strength bilaterally.     RUE: FF 140, ABD 10 ER 40. 4/5 strength in rotator cuff dependent positions. Belly press test intact.   ?  Xrays: Interval healing of right pubic rami fractures. No bony pathology about lateral side of pelvis.    ?  Assessment & Plan:  ?  Right pubic rami fracture with Rotator cuff weakness/shoulder pain  ?  -WBAT BLE  -No further fu on pelvis is required.     Presumed Rotator cuff pathology  -Continue therapy  -Offered steroid injection.  She is considering this but will give therapy one more round  -Can schedule 6 wk appt but will cancel if she is doing well  ?  ?

## 2019-09-30 ENCOUNTER — Emergency Department: Admit: 2019-09-30 | Discharge: 2019-09-30 | Payer: MEDICARE

## 2019-09-30 ENCOUNTER — Encounter: Admit: 2019-09-30 | Discharge: 2019-09-30 | Payer: MEDICARE

## 2019-09-30 ENCOUNTER — Emergency Department: Admit: 2019-09-30 | Discharge: 2019-10-01 | Payer: MEDICARE

## 2019-09-30 DIAGNOSIS — C801 Malignant (primary) neoplasm, unspecified: Secondary | ICD-10-CM

## 2019-09-30 DIAGNOSIS — M1990 Unspecified osteoarthritis, unspecified site: Secondary | ICD-10-CM

## 2019-09-30 DIAGNOSIS — S52502A Unspecified fracture of the lower end of left radius, initial encounter for closed fracture: Secondary | ICD-10-CM

## 2019-09-30 DIAGNOSIS — G629 Polyneuropathy, unspecified: Secondary | ICD-10-CM

## 2019-09-30 DIAGNOSIS — Z9221 Personal history of antineoplastic chemotherapy: Secondary | ICD-10-CM

## 2019-09-30 DIAGNOSIS — S92902A Unspecified fracture of left foot, initial encounter for closed fracture: Secondary | ICD-10-CM

## 2019-09-30 DIAGNOSIS — W19XXXA Unspecified fall, initial encounter: Secondary | ICD-10-CM

## 2019-09-30 DIAGNOSIS — M549 Dorsalgia, unspecified: Secondary | ICD-10-CM

## 2019-09-30 DIAGNOSIS — I10 Essential (primary) hypertension: Secondary | ICD-10-CM

## 2019-09-30 DIAGNOSIS — S52602A Unspecified fracture of lower end of left ulna, initial encounter for closed fracture: Secondary | ICD-10-CM

## 2019-09-30 DIAGNOSIS — S62109A Fracture of unspecified carpal bone, unspecified wrist, initial encounter for closed fracture: Secondary | ICD-10-CM

## 2019-09-30 DIAGNOSIS — K219 Gastro-esophageal reflux disease without esophagitis: Secondary | ICD-10-CM

## 2019-09-30 DIAGNOSIS — E785 Hyperlipidemia, unspecified: Secondary | ICD-10-CM

## 2019-09-30 DIAGNOSIS — G35 Multiple sclerosis: Secondary | ICD-10-CM

## 2019-09-30 MED ORDER — SODIUM CHLORIDE 0.9 % IR SOLN
Freq: Once | 0 refills | Status: DC
Start: 2019-09-30 — End: 2019-10-01

## 2019-09-30 MED ORDER — NAPROXEN 500 MG PO TAB
500 mg | ORAL_TABLET | Freq: Two times a day (BID) | ORAL | 0 refills | 30.00000 days | Status: DC
Start: 2019-09-30 — End: 2019-10-03

## 2019-09-30 MED ORDER — LIDOCAINE-EPINEPHRINE 1 %-1:100,000 IJ SOLN
30 mL | Freq: Once | INTRAMUSCULAR | 0 refills | Status: DC
Start: 2019-09-30 — End: 2019-10-01

## 2019-09-30 MED ORDER — OXYCODONE-ACETAMINOPHEN 5-325 MG PO TAB
1-2 | ORAL | 0 refills | Status: DC | PRN
Start: 2019-09-30 — End: 2019-10-01
  Administered 2019-09-30 – 2019-10-01 (×2): 2 via ORAL

## 2019-09-30 MED ORDER — FAMOTIDINE 20 MG PO TAB
20 mg | ORAL_TABLET | Freq: Two times a day (BID) | ORAL | 0 refills | 90.00000 days | Status: AC
Start: 2019-09-30 — End: ?

## 2019-09-30 MED ORDER — HYDROCODONE-ACETAMINOPHEN 5-325 MG PO TAB
1 | ORAL_TABLET | ORAL | 0 refills | 15.00000 days | Status: DC | PRN
Start: 2019-09-30 — End: 2019-10-03

## 2019-09-30 NOTE — ED Provider Notes
Rachael Black is a 74 y.o. female.    Chief Complaint:  Chief Complaint   Patient presents with   ? Wrist Injury     fall today, landed on L wrist. L wrist bent. able to move fingers, unable to move wrist. wrist swollen.        History of Present Illness:    History provided by:  Spouse and patient    Rachael Black is a 74 y.o. female with a PMH of breast cancer, HTN, OA, MS, recent pelvic fracture presented to the ED after a mechanical fall from standing while in the parking lot around 1230-1300 today. Pt states she tripped over her feet and fell onto her left outstretched hand. Pt denies hitting her head or LOC. She was not dizzy before or after the fall. She denies any injuries elsewhere except a small abrasion to her left knee. She has pain and swelling to her left wrist up to her mid forearm. She denies any numbness in her fingersShe reports crunching when she moves her wrist. She has limited range of motion of the wrist and fingers due to pain. Pt reports previous fracture of this same wrist two other times in the past (2008 and 2018) but no surgical history with the L wrist. Pt has no other complaints today.        Review of Systems:  Review of Systems   Constitutional: Negative.    HENT: Negative.    Eyes: Negative.    Respiratory: Negative.    Cardiovascular: Negative.    Gastrointestinal: Negative.    Musculoskeletal:        L wrist pain and swelling   Skin: Positive for wound (small abrasion to her L knee). Negative for color change.   Neurological: Negative for dizziness, light-headedness and numbness.   Psychiatric/Behavioral: Negative for agitation and confusion.       Allergies:  Sulfa (sulfonamide antibiotics)    Past Medical History:  Medical History:   Diagnosis Date   ? Arthritis    ? Back pain    ? Cancer (HCC) 06/2015    Breast    ? Foot fracture, left 09/26/2016   ? GERD (gastroesophageal reflux disease)    ? History of chemotherapy     neoadjuvant chemotherapy 2017   ? HLD (hyperlipidemia)    ? Hypertension    ? Multiple sclerosis (HCC) 1990   ? Neuropathy    ? Wrist fracture 09/26/2016    heads of ulna and radius       Past Surgical History:  Surgical History:   Procedure Laterality Date   ? HX RHINOPLASTY  1974   ? HX TUBAL LIGATION  1976   ? ANKLE SURGERY Right 2000    with instrumentation   ? Left radioactive seed localized lumpectomy, left axillary sentinel lymph node biopsy  Left 12/14/2015    Performed by Cordelia Poche, MD at [facility]   ? COLONOSCOPY     ? FOOT SURGERY         Medical History:   Diagnosis Date   ? Arthritis    ? Back pain    ? Cancer (HCC) 06/2015    Breast    ? Foot fracture, left 09/26/2016   ? GERD (gastroesophageal reflux disease)    ? History of chemotherapy     neoadjuvant chemotherapy 2017   ? HLD (hyperlipidemia)    ? Hypertension    ? Multiple sclerosis (HCC) 1990   ? Neuropathy    ?  Wrist fracture 09/26/2016    heads of ulna and radius     Surgical History:   Procedure Laterality Date   ? HX RHINOPLASTY  1974   ? HX TUBAL LIGATION  1976   ? ANKLE SURGERY Right 2000    with instrumentation   ? Left radioactive seed localized lumpectomy, left axillary sentinel lymph node biopsy  Left 12/14/2015    Performed by Cordelia Poche, MD at [facility]   ? COLONOSCOPY     ? FOOT SURGERY         Social History:  Social History     Tobacco Use   ? Smoking status: Former Smoker     Packs/day: 1.00     Years: 10.00     Pack years: 10.00     Types: Cigarettes     Quit date: 02/28/2005     Years since quitting: 14.5   ? Smokeless tobacco: Never Used   Substance Use Topics   ? Alcohol use: Yes     Alcohol/week: 8.0 standard drinks     Types: 8 Standard drinks or equivalent per week   ? Drug use: No     Social History     Substance and Sexual Activity   Drug Use No             Family History:  Family History   Problem Relation Age of Onset   ? Arthritis-osteo Mother    ? Thyroid Disease Mother    ? Heart Disease Father    ? High Cholesterol Father    ? Cancer Maternal Aunt    ? Cancer Maternal Uncle    ? Cancer Paternal Uncle    ? Cancer-Colon Maternal Grandmother 25   ? Stroke Maternal Grandmother    ? Cancer-Colon Paternal Grandmother        Vitals:  ED Vitals    Date and Time T BP P RR SPO2P SPO2 User   09/30/19 1949 -- 147/73 -- 14 PER MINUTE 86 96 % RH   09/30/19 1320 36.2 ?C (97.1 ?F) 143/87 -- 15 PER MINUTE 83 97 % CL          Physical Exam:  Physical Exam  Constitutional:       General: She is not in acute distress.     Appearance: Normal appearance.   HENT:      Head: Normocephalic and atraumatic.      Nose: Nose normal.      Mouth/Throat:      Mouth: Mucous membranes are moist.   Eyes:      Extraocular Movements: Extraocular movements intact.      Conjunctiva/sclera: Conjunctivae normal.   Cardiovascular:      Rate and Rhythm: Normal rate and regular rhythm.      Pulses: Normal pulses.           Radial pulses are 2+ on the right side and 2+ on the left side.   Pulmonary:      Effort: Pulmonary effort is normal.      Breath sounds: Normal breath sounds.   Abdominal:      General: Abdomen is flat. Bowel sounds are normal.      Palpations: Abdomen is soft.   Musculoskeletal:         General: Signs of injury present.      Left wrist: Swelling, deformity and bony tenderness present. Decreased range of motion. Normal pulse.      Cervical back: Normal range of motion and  neck supple.   Skin:     General: Skin is warm and dry.      Capillary Refill: Capillary refill takes less than 2 seconds.   Neurological:      General: No focal deficit present.      Mental Status: She is alert and oriented to person, place, and time.   Psychiatric:         Mood and Affect: Mood normal.         Behavior: Behavior normal.         Laboratory Results:  Labs Reviewed   BASIC METABOLIC PANEL   CBC AND DIFF          Radiology Interpretation:    CT UPPER EXTREM WO CONT LEFT   Final Result         1.  Partially reduced, comminuted and impacted fracture of the left distal radius.   2.  Improved alignment of a comminuted and mildly displaced distal ulnar fracture.   3.  Moderate soft tissue edema at the wrist.   4.  Degenerative changes in the wrist including marked first CMC arthrosis.      By my electronic signature, I attest that I have personally reviewed the images for this examination and formulated the interpretations and opinions expressed in this report          Finalized by Sherolyn Buba, M.D. on 09/30/2019 8:09 PM. Dictated by Alvester Morin, M.D. on 09/30/2019 7:48 PM.         WRIST 2 VIEWS LEFT   Final Result         Improved alignment at the distal radial and ulnar fractures since earlier today, with interval casting and reduction.          Finalized by Sherolyn Buba, M.D. on 09/30/2019 7:49 PM. Dictated by Sherolyn Buba, M.D. on 09/30/2019 7:46 PM.         WRIST COMP MIN 3 VIEWS LEFT   Final Result         1.  Comminuted and impacted fracture of the left distal radius with marked dorsal angulation of the articular surface. Intra-articular involvement is difficult to assess, although there does not appear to be significant cortical step-off on the lateral view.      2.  Comminuted and mildly displaced distal ulnar fracture with dorsal angulation and with distal migration of the ulnar shaft.      3.  Severe first CMC osteoarthritis.                Finalized by Kimber Relic, M.D. on 09/30/2019 4:51 PM. Dictated by Kimber Relic, M.D. on 09/30/2019 4:48 PM.               EKG:    None indicated    ED Course:  74 y.o. female presented to the ED for L wrist pain in the context of OA, MS, and recent pelvic fracture .  - Patient was evaluated by resident and attending physicians.  - Available records were reviewed.  - Upon arrival to the ED patient reported 7-8/10 pain with obvious deformity and swelling to the left wrist. Pt A&Ox4  - Pt was given percocet for pain  - L wrist X-ray showed comminuted and impacted fracture of L distal radius and comminuted and displaced L distal ulnar fracture  - Due to the nature of the fractures, orthopedic surgery was consulted for further eval and to assist with management. Ortho performed a bedside reduction under local anesthetic and  splinted. Plan for surgery later this week. Pt was given instructions on how to follow up with ortho  - Dispo: home  - Patient reassessed with appropriate vital signs and symptom control for discharge.   - Provided patient with discharge information, strict return precautions, and follow up instructions. Patient verbalized understanding. All questions were answered prior to discharge. The patient was discharged from the emergency department in stable condition, demonstrating a clear understanding of return precautions and agreeable to follow up planning.          ED Scoring:                             MDM  Reviewed: previous chart, nursing note and vitals  Interpretation: x-ray and CT scan  Consults: Orthopedics        Facility Administered Meds:  Medications   oxyCODONE/acetaminophen (PERCOCET) 5/325 mg tablet 1-2 tablet (2 tablets Oral Given 09/30/19 1945)   lidocaine 1%/EPINEPHrine 1:100,000 injection 30 mL (has no administration in time range)   sodium chloride 0.9% irrigation bottle (has no administration in time range)   sodium chloride 0.9% irrigation bottle (has no administration in time range)   SODIUM CHLORIDE 0.9 % IR SOLN (Cabinet Override) (0 mL  Cabinet Pull 09/30/19 1941)         Clinical Impression:  Clinical Impression   Closed fracture of distal end of left radius, unspecified fracture morphology, initial encounter   Closed fracture of distal end of left ulna, unspecified fracture morphology, initial encounter   Fall, initial encounter       Disposition/Follow up  ED Disposition     ED Disposition    Discharge        Emergency Department: [facility]  [address]  [address]  [place] [address]  [phone]    If symptoms worsen    Orthopedics and Sports Medicine: [facility]  [address], [address]  [place] [address]  [phone]    call to schedule an appointment for follow up within the next few days      Medications:  Discharge Medication List as of 09/30/2019  7:51 PM          Procedure Notes:  Procedures        Basilia Jumbo, MD

## 2019-09-30 NOTE — ED Notes
74 yo F presents to ED2 from triage c/o L wrist injury after a mechanical fall from standing ~3-4 hours ago. Pt reports she tripped while exiting a restaurant and fell onto her left arm & leg. Pt denies striking head or LOC during fall.    Obvious deformity & swelling noted in L wrist, dec ROM. Cap refill brisk in L digits, L radial pulse 3+. Abrasion noted on L shin. Pt is AOx4, breathing is even/non-labored on RA, skin is pink/warm/dry. Pt is currently resting in chair in NAD. L arm elevated on pillow and ice pack applied. Awaiting physician evaluation.    Belongings: clothing, glasses, ring on R hand

## 2019-09-30 NOTE — Consults
[name]      Admission Date: 09/30/2019                                                  Chief Complaint/Reason for Consult:  FOOSH injury today, L wrist      Assessment/Plan   1. Displaced shortened L distal radius fracture    Pt is a 74 y.o. right-hand-dominant female with history, imaging and physical exam consistent with L distal radius fracture. Pt stated she was ambulating earlier today when she tripped and fell onto an outstretched L hand/wrist. She noted acute onset of pain and deformity. She then presented to the [facility] ED for further evaluation and management. Risks, benefits, alternatives, and expected convalescence of bedside closed reduction and splinting were discussed in detail with the patient. She voiced understanding and provided verbal consent to proceed. Approximately 15 mL of 1% lidocaine was used to obtain a hematoma block of the left distal radius fracture. Upon obtaining adequate anesthesia, the left upper extremity was hung in finger traps for approximately 15 minutes and then a closed reduction of the left distal radius was performed. The patient was then placed into a well-padded volar and dorsal slab splint. Recommend patient call the orthopedic surgery clinic early tomorrow to schedule a follow-up appointment for later this week for discussion of further management and possible operative intervention.    -No acute surgical intervention  -NWB LUE; aggressive elevation upon discharge and prior to follow-up appointment  -Okay for diet from orthopedic standpoint  -Pain control/medical management per primary  -Imaging: Plain films of the left wrist demonstrate a shortened and displaced distal radius fracture  -Abx/tetanus: Not indicated from an orthopedic standpoint  -Obtain osteoporosis work-up: Vitamin D, TSH, and PTH labs, patient will likely need outpatient osteoporosis management  -Last PO intake: 09/30/2019  -Follow up in clinic: please call [phone] to schedule appointment upon discharge from the ED    Patient to be discussed with staff surgeon Dr. Desiree Hane.      During normal business hours, please contact Danae Chen [phone]), Tiffany Ward [phone]), Cameron Proud [phone]) or Azzie Roup [phone]) regarding this patient. At all other times, contact the orthopedic surgery resident on call for any questions or concerns.        Kathleene Hazel, MD  [phone]  ____________________________________________________________________________________________________________________________________________      History of Present Illness: Rachael Black is a 74 y.o. right-hand-dominant female with past medical history significant for breast cancer now in remission, HTN, MS, s/p ORIF of the first, second, and third TMT joints of the left foot (03/07/2018, and two previous distal radius fractures of the left wrist (2018, 2008) now presenting with acute onset left wrist pain after ground-level fall. Patient was walking in her home when she tripped and fell to the ground. She attempted to break her fall with an outstretched left arm/wrist. She noted acute onset of pain and deformity shortly after her fall. She denies trauma to the head, contralateral upper extremity or bilateral lower extremities. Shortly after the fall she presented to the [facility] ED for further evaluation where plain films were obtained which demonstrated a shortened and displaced distal radius fracture. She denies new onset numbness, tingling, or weakness about the left hand after the fall.    Medical History:   Diagnosis Date   ? Arthritis    ?  Back pain    ? Cancer (HCC) 06/2015    Breast    ? Foot fracture, left 09/26/2016   ? GERD (gastroesophageal reflux disease)    ? History of chemotherapy     neoadjuvant chemotherapy 2017   ? HLD (hyperlipidemia)    ? Hypertension    ? Multiple sclerosis (HCC) 1990   ? Neuropathy    ? Wrist fracture 09/26/2016    heads of ulna and radius     Surgical History:   Procedure Laterality Date   ? HX RHINOPLASTY  1974   ? HX TUBAL LIGATION  1976   ? ANKLE SURGERY Right 2000    with instrumentation   ? Left radioactive seed localized lumpectomy, left axillary sentinel lymph node biopsy  Left 12/14/2015    Performed by Cordelia Poche, MD at [facility]   ? COLONOSCOPY     ? FOOT SURGERY       Social History     Tobacco Use   ? Smoking status: Former Smoker     Packs/day: 1.00     Years: 10.00     Pack years: 10.00     Types: Cigarettes     Quit date: 02/28/2005     Years since quitting: 14.5   ? Smokeless tobacco: Never Used   Substance Use Topics   ? Alcohol use: Yes     Alcohol/week: 8.0 standard drinks     Types: 8 Standard drinks or equivalent per week   ? Drug use: No         Family Hx: Reviewed and non-contributory  Allergies:  Sulfa (sulfonamide antibiotics)    Outpatient Medications as of 09/30/2019   Medication Sig Dispense Refill   ? acetaminophen SR(+) (TYLENOL) 650 mg tablet Take 1,950 mg by mouth twice daily.     ? alendronate (FOSAMAX) 70 mg tablet Take one tablet by mouth every 7 days. Take at least 30 minutes before breakfast with plain water. Do not lie down for 30 minutes. 12 tablet 3   ? aspirin EC 81 mg tablet Take 81 mg by mouth daily. Take with food.     ? cholecalciferol (VITAMIN D-3) 1,000 units tablet Take 1,000 Units by mouth daily.     ? docusate (COLACE) 100 mg capsule Take 100 mg by mouth daily.     ? gabapentin (NEURONTIN) 300 mg capsule Take 3 caps twice a day.  Indications: neuropathic pain 540 capsule 3   ? pantoprazole DR (PROTONIX) 20 mg tablet Take one tablet by mouth daily. 90 tablet 3   ? rosuvastatin (CRESTOR) 10 mg tablet Take one tablet by mouth daily. 90 tablet 3   ? telmisartan (MICARDIS) 20 mg tablet Take one tablet by mouth daily. 90 tablet 3         Review of Systems:    Positive: Left wrist pain    Otherwise, 10 point ROS was negative except the pertinent information included in the HPI    Vital Signs:  Last Filed in 24 hours   BP: 143/87 (08/02 1320)  Temp: 36.2 ?C (97.1 ?F) (08/02 1320)  Respirations: 15 PER MINUTE (08/02 1320)  SpO2: 97 % (08/02 1320)  SpO2 Pulse: 83 (08/02 1320)     Physical Exam:    Constitutional: A&O, NAD  HEENT: EOMI, normocephalic  Neck: Supple, symmetric   Respiratory:  Unlabored respirations  Cardiovascular: Regular rate  Skin: No open fractures, rashes, abrasions, lacerations; significant swelling about the left wrist with obvious deformity; skin  intact  Musculoskeletal: LUE:  +AIN/PIN/U. SILT distally. Palpable radial pulse. Hand warm and well-perfused.         Lab/Radiology/Other Diagnostic Tests:    Radiology:   WRIST COMP MIN 3 VIEWS LEFT   Final Result         1.  Comminuted and impacted fracture of the left distal radius with marked dorsal angulation of the articular surface. Intra-articular involvement is difficult to assess, although there does not appear to be significant cortical step-off on the lateral view.      2.  Comminuted and mildly displaced distal ulnar fracture with dorsal angulation and with distal migration of the ulnar shaft.      3.  Severe first CMC osteoarthritis.                Finalized by Kimber Relic, M.D. on 09/30/2019 4:51 PM. Dictated by Kimber Relic, M.D. on 09/30/2019 4:48 PM.               CBC w/Diff   Lab Results   Component Value Date/Time    WBC 5.2 05/07/2019 05:30 AM    HGB 14.0 05/07/2019 05:30 AM    HCT 42.7 05/07/2019 05:30 AM    PLTCT 202 05/07/2019 05:30 AM           Basic Metabolic Profile   Lab Results   Component Value Date/Time    NA 137 05/07/2019 05:30 AM    K 4.7 05/07/2019 05:30 AM    CL 106 05/07/2019 05:30 AM    CO2 21 05/07/2019 05:30 AM    GAP 10 05/07/2019 05:30 AM    BUN 10 05/07/2019 05:30 AM    CR 0.8 08/07/2019 08:55 AM    CR 0.82 05/07/2019 05:30 AM    GLU 92 05/07/2019 05:30 AM        Coagulation Studies   Lab Results   Component Value Date/Time    PTT 30.5 05/03/2019 11:43 PM    INR 0.9 05/03/2019 11:43 PM

## 2019-10-01 ENCOUNTER — Encounter: Admit: 2019-10-01 | Discharge: 2019-10-01 | Payer: MEDICARE

## 2019-10-01 ENCOUNTER — Ambulatory Visit: Admit: 2019-10-01 | Discharge: 2019-10-01 | Payer: MEDICARE

## 2019-10-01 DIAGNOSIS — S62109A Fracture of unspecified carpal bone, unspecified wrist, initial encounter for closed fracture: Secondary | ICD-10-CM

## 2019-10-01 DIAGNOSIS — G35 Multiple sclerosis: Secondary | ICD-10-CM

## 2019-10-01 DIAGNOSIS — I10 Essential (primary) hypertension: Secondary | ICD-10-CM

## 2019-10-01 DIAGNOSIS — I2510 Atherosclerotic heart disease of native coronary artery without angina pectoris: Secondary | ICD-10-CM

## 2019-10-01 DIAGNOSIS — S6992XA Unspecified injury of left wrist, hand and finger(s), initial encounter: Secondary | ICD-10-CM

## 2019-10-01 DIAGNOSIS — K219 Gastro-esophageal reflux disease without esophagitis: Secondary | ICD-10-CM

## 2019-10-01 DIAGNOSIS — S92902A Unspecified fracture of left foot, initial encounter for closed fracture: Secondary | ICD-10-CM

## 2019-10-01 DIAGNOSIS — E785 Hyperlipidemia, unspecified: Secondary | ICD-10-CM

## 2019-10-01 DIAGNOSIS — M549 Dorsalgia, unspecified: Secondary | ICD-10-CM

## 2019-10-01 DIAGNOSIS — G629 Polyneuropathy, unspecified: Secondary | ICD-10-CM

## 2019-10-01 DIAGNOSIS — C801 Malignant (primary) neoplasm, unspecified: Secondary | ICD-10-CM

## 2019-10-01 DIAGNOSIS — Z9221 Personal history of antineoplastic chemotherapy: Secondary | ICD-10-CM

## 2019-10-01 DIAGNOSIS — M1990 Unspecified osteoarthritis, unspecified site: Secondary | ICD-10-CM

## 2019-10-01 DIAGNOSIS — S52592A Other fractures of lower end of left radius, initial encounter for closed fracture: Secondary | ICD-10-CM

## 2019-10-01 MED ORDER — AMINOPHYLLINE 500 MG/20 ML IV SOLN
50 mg | INTRAVENOUS | 0 refills | Status: AC | PRN
Start: 2019-10-01 — End: ?

## 2019-10-01 MED ORDER — SODIUM CHLORIDE 0.9 % IV SOLP
250 mL | INTRAVENOUS | 0 refills | Status: AC | PRN
Start: 2019-10-01 — End: ?

## 2019-10-01 MED ORDER — EUCALYPTUS-MENTHOL MM LOZG
1 | Freq: Once | ORAL | 0 refills | Status: AC | PRN
Start: 2019-10-01 — End: ?

## 2019-10-01 MED ORDER — ALBUTEROL SULFATE 90 MCG/ACTUATION IN HFAA
2 | RESPIRATORY_TRACT | 0 refills | Status: DC | PRN
Start: 2019-10-01 — End: 2019-10-06

## 2019-10-01 MED ORDER — REGADENOSON 0.4 MG/5 ML IV SYRG
.4 mg | Freq: Once | INTRAVENOUS | 0 refills | Status: CP
Start: 2019-10-01 — End: ?

## 2019-10-01 MED ORDER — NITROGLYCERIN 0.4 MG SL SUBL
.4 mg | SUBLINGUAL | 0 refills | Status: DC | PRN
Start: 2019-10-01 — End: 2019-10-06

## 2019-10-01 NOTE — ED Notes
This RN reviews discharge instructions with patient; AVS paperwork & printed prescriptions provided. Pt AOx4, verbalizes understanding and denies further questions. Pt ambulates off unit with an even, steady gait in no acute distress. All belongings and paperwork in patient possession upon departure.

## 2019-10-02 ENCOUNTER — Ambulatory Visit: Admit: 2019-10-02 | Discharge: 2019-10-02 | Payer: MEDICARE

## 2019-10-02 ENCOUNTER — Encounter: Admit: 2019-10-02 | Discharge: 2019-10-02 | Payer: MEDICARE

## 2019-10-02 DIAGNOSIS — S52502A Unspecified fracture of the lower end of left radius, initial encounter for closed fracture: Secondary | ICD-10-CM

## 2019-10-02 DIAGNOSIS — S52692D Other fracture of lower end of left ulna, subsequent encounter for closed fracture with routine healing: Secondary | ICD-10-CM

## 2019-10-02 DIAGNOSIS — S52601A Unspecified fracture of lower end of right ulna, initial encounter for closed fracture: Secondary | ICD-10-CM

## 2019-10-02 NOTE — Telephone Encounter
ED Discharge Follow Up  Reached patient: Seen in clinic 24-48 hours post discharge by Ortho   Admission Information  Hospital Name : [facility]  ED Admission Date: 09/30/19 ED Discharge Date: 09/30/19 Admission Diagnosis: Wrist injury  Discharge Diagnosis   Closed fracture of distal end of left radius, unspecified fracture morphology, initial encounter   Closed fracture of distal end of left ulna, unspecified fracture morphology, initial encounter   Fall, initial encounter   Hospital Services: Unplanned  Today's call is 2 (calendar) days post discharge    ? acetaminophen SR(+) (TYLENOL) 650 mg tablet Take 1,950 mg by mouth twice daily.   ? alendronate (FOSAMAX) 70 mg tablet Take one tablet by mouth every 7 days. Take at least 30 minutes before breakfast with plain water. Do not lie down for 30 minutes.   ? aspirin EC 81 mg tablet Take 81 mg by mouth daily. Take with food.   ? cholecalciferol (VITAMIN D-3) 1,000 units tablet Take 1,000 Units by mouth daily.   ? docusate (COLACE) 100 mg capsule Take 100 mg by mouth daily.   ? famotidine (PEPCID) 20 mg tablet Take one tablet by mouth twice daily. Indications: take this medication while taking naprosyn to avoid stomach bleeding   ? gabapentin (NEURONTIN) 300 mg capsule Take 3 caps twice a day.  Indications: neuropathic pain   ? HYDROcodone/acetaminophen (NORCO) 5/325 mg tablet Take one tablet by mouth every 6 hours as needed for Pain   ? naproxen (NAPROSYN) 500 mg tablet Take one tablet by mouth twice daily with meals. Take with food.  Indications: pain   ? pantoprazole DR (PROTONIX) 20 mg tablet Take one tablet by mouth daily.   ? rosuvastatin (CRESTOR) 10 mg tablet Take one tablet by mouth daily.   ? telmisartan (MICARDIS) 20 mg tablet Take one tablet by mouth daily.         Scheduling Follow-up Appointment  Upcoming appointment date and time and with whom scheduled:   Future Appointments   Date Time Provider Department Center   10/22/2019 10:50 AM Heddings, Revonda Standard, MD [facility]   11/01/2019 10:00 AM [facility] None   11/01/2019 10:30 AM Laurence Slate, MD [facility]   12/09/2019 10:30 AM Alvera Singh, MD [facility] ENT   12/10/2019  9:30 AM Ilsa Iha, DO [facility]   04/14/2020 10:30 AM [facility] ? [facility] ROOM 2 [facility] Radiology   04/16/2020  9:30 AM Tsue, Delaney Meigs, MD [facility] ENT     When was patient?s last PCP visit: 06/14/2019  PCP primary location: [facility]  PCP appointment scheduled? No, routine appt 12/10/2019  Specialist appointment scheduled? Yes, with Orthopedic seen 10/01/2019  Both PCP and Specialist appointment scheduled: No  Is assistance with transportation needed? No  MyChart message sent? Active in MyChart. No message sent.     ED Communication   Did Pt call Clinic prior to going to ED? No      Lavell Islam, [place]

## 2019-10-03 ENCOUNTER — Encounter: Admit: 2019-10-03 | Discharge: 2019-10-03 | Payer: MEDICARE

## 2019-10-03 ENCOUNTER — Ambulatory Visit: Admit: 2019-10-03 | Discharge: 2019-10-03 | Payer: MEDICARE

## 2019-10-03 DIAGNOSIS — M549 Dorsalgia, unspecified: Secondary | ICD-10-CM

## 2019-10-03 DIAGNOSIS — C801 Malignant (primary) neoplasm, unspecified: Secondary | ICD-10-CM

## 2019-10-03 DIAGNOSIS — S92902A Unspecified fracture of left foot, initial encounter for closed fracture: Secondary | ICD-10-CM

## 2019-10-03 DIAGNOSIS — E785 Hyperlipidemia, unspecified: Secondary | ICD-10-CM

## 2019-10-03 DIAGNOSIS — M1990 Unspecified osteoarthritis, unspecified site: Secondary | ICD-10-CM

## 2019-10-03 DIAGNOSIS — G35 Multiple sclerosis: Secondary | ICD-10-CM

## 2019-10-03 DIAGNOSIS — S62109A Fracture of unspecified carpal bone, unspecified wrist, initial encounter for closed fracture: Secondary | ICD-10-CM

## 2019-10-03 DIAGNOSIS — Z9221 Personal history of antineoplastic chemotherapy: Secondary | ICD-10-CM

## 2019-10-03 DIAGNOSIS — K219 Gastro-esophageal reflux disease without esophagitis: Secondary | ICD-10-CM

## 2019-10-03 DIAGNOSIS — I10 Essential (primary) hypertension: Secondary | ICD-10-CM

## 2019-10-03 DIAGNOSIS — G629 Polyneuropathy, unspecified: Secondary | ICD-10-CM

## 2019-10-03 MED ORDER — OXYCODONE 5 MG PO TAB
5-10 mg | Freq: Once | ORAL | 0 refills | Status: DC | PRN
Start: 2019-10-03 — End: 2019-10-03

## 2019-10-03 MED ORDER — ONDANSETRON HCL (PF) 4 MG/2 ML IJ SOLN
4 mg | Freq: Once | INTRAVENOUS | 0 refills | Status: DC | PRN
Start: 2019-10-03 — End: 2019-10-03

## 2019-10-03 MED ORDER — PROMETHAZINE 25 MG/ML IJ SOLN
6.25 mg | INTRAVENOUS | 0 refills | Status: DC | PRN
Start: 2019-10-03 — End: 2019-10-03

## 2019-10-03 MED ORDER — LIDOCAINE (PF) 20 MG/ML (2 %) IJ SOLN
0 refills | Status: CP
Start: 2019-10-03 — End: ?
  Administered 2019-10-03: 14:00:00 10 mL

## 2019-10-03 MED ORDER — FENTANYL CITRATE (PF) 50 MCG/ML IJ SOLN
INTRAVENOUS | 0 refills | Status: CP
Start: 2019-10-03 — End: ?
  Administered 2019-10-03: 14:00:00 100 ug via INTRAVENOUS

## 2019-10-03 MED ORDER — LIDOCAINE (PF) 20 MG/ML (2 %) IJ SOLN
INTRAVENOUS | 0 refills | Status: DC
Start: 2019-10-03 — End: 2019-10-03

## 2019-10-03 MED ORDER — OXYCODONE 5 MG PO TAB
5 mg | ORAL_TABLET | ORAL | 0 refills | 6.00000 days | Status: AC | PRN
Start: 2019-10-03 — End: ?
  Filled 2019-10-03: qty 30, 8d supply, fill #1

## 2019-10-03 MED ORDER — CEFAZOLIN 1 GRAM IJ SOLR
INTRAVENOUS | 0 refills | Status: DC
Start: 2019-10-03 — End: 2019-10-03
  Administered 2019-10-03: 15:00:00 2 g via INTRAVENOUS

## 2019-10-03 MED ORDER — PROPOFOL INJ 10 MG/ML IV VIAL
INTRAVENOUS | 0 refills | Status: DC
Start: 2019-10-03 — End: 2019-10-03
  Administered 2019-10-03: 14:00:00 50 mg via INTRAVENOUS
  Administered 2019-10-03: 15:00:00 20 mg via INTRAVENOUS
  Administered 2019-10-03: 15:00:00 40 mg via INTRAVENOUS

## 2019-10-03 MED ORDER — LIDOCAINE (PF) 10 MG/ML (1 %) IJ SOLN
.2 mL | INTRAMUSCULAR | 0 refills | Status: DC | PRN
Start: 2019-10-03 — End: 2019-10-03

## 2019-10-03 MED ORDER — LACTATED RINGERS IV SOLP
1000 mL | INTRAVENOUS | 0 refills | Status: DC
Start: 2019-10-03 — End: 2019-10-03
  Administered 2019-10-03 (×2): 1000.000 mL via INTRAVENOUS

## 2019-10-03 MED ORDER — FENTANYL CITRATE (PF) 50 MCG/ML IJ SOLN
25-50 ug | INTRAVENOUS | 0 refills | Status: DC | PRN
Start: 2019-10-03 — End: 2019-10-03

## 2019-10-03 MED ORDER — PROPOFOL INJ 10 MG/ML IV VIAL
INTRAVENOUS | 0 refills | Status: DC
Start: 2019-10-03 — End: 2019-10-03

## 2019-10-03 MED ORDER — PHENYLEPHRINE HCL IN 0.9% NACL 1MG/10ML INFUSION (AN)(OSM)
INTRAVENOUS | 0 refills | Status: DC
Start: 2019-10-03 — End: 2019-10-03
  Administered 2019-10-03: 16:00:00 100 ug via INTRAVENOUS

## 2019-10-03 MED ORDER — HALOPERIDOL LACTATE 5 MG/ML IJ SOLN
1 mg | Freq: Once | INTRAVENOUS | 0 refills | Status: DC | PRN
Start: 2019-10-03 — End: 2019-10-03

## 2019-10-03 MED ORDER — ACETAMINOPHEN 500 MG PO TAB
1000 mg | Freq: Once | ORAL | 0 refills | Status: DC | PRN
Start: 2019-10-03 — End: 2019-10-03

## 2019-10-03 MED ORDER — BUPIVACAINE HCL 0.5 % (5 MG/ML) IJ SOLN
0 refills | Status: CP
Start: 2019-10-03 — End: ?
  Administered 2019-10-03: 14:00:00 20 mL

## 2019-10-03 MED ORDER — FENTANYL CITRATE (PF) 50 MCG/ML IJ SOLN
12.5-25 ug | INTRAVENOUS | 0 refills | Status: DC | PRN
Start: 2019-10-03 — End: 2019-10-03

## 2019-10-03 MED ORDER — PROPOFOL 10 MG/ML IV EMUL 100 ML (INFUSION)(AM)(OR)
INTRAVENOUS | 0 refills | Status: DC
Start: 2019-10-03 — End: 2019-10-03
  Administered 2019-10-03: 14:00:00 125 ug/kg/min via INTRAVENOUS

## 2019-10-03 MED ORDER — FENTANYL CITRATE (PF) 50 MCG/ML IJ SOLN
50-100 ug | INTRAVENOUS | 0 refills | Status: DC | PRN
Start: 2019-10-03 — End: 2019-10-03

## 2019-10-03 MED ORDER — LIDOCAINE (PF) 10 MG/ML (1 %) IJ SOLN
SUBCUTANEOUS | 0 refills | Status: CP
Start: 2019-10-03 — End: ?
  Administered 2019-10-03: 14:00:00 3 mL via SUBCUTANEOUS

## 2019-10-05 ENCOUNTER — Encounter: Admit: 2019-10-05 | Discharge: 2019-10-05 | Payer: MEDICARE

## 2019-10-05 DIAGNOSIS — Z9221 Personal history of antineoplastic chemotherapy: Secondary | ICD-10-CM

## 2019-10-05 DIAGNOSIS — G35 Multiple sclerosis: Secondary | ICD-10-CM

## 2019-10-05 DIAGNOSIS — S62109A Fracture of unspecified carpal bone, unspecified wrist, initial encounter for closed fracture: Secondary | ICD-10-CM

## 2019-10-05 DIAGNOSIS — M549 Dorsalgia, unspecified: Secondary | ICD-10-CM

## 2019-10-05 DIAGNOSIS — K219 Gastro-esophageal reflux disease without esophagitis: Secondary | ICD-10-CM

## 2019-10-05 DIAGNOSIS — C801 Malignant (primary) neoplasm, unspecified: Secondary | ICD-10-CM

## 2019-10-05 DIAGNOSIS — G629 Polyneuropathy, unspecified: Secondary | ICD-10-CM

## 2019-10-05 DIAGNOSIS — M1990 Unspecified osteoarthritis, unspecified site: Secondary | ICD-10-CM

## 2019-10-05 DIAGNOSIS — I10 Essential (primary) hypertension: Secondary | ICD-10-CM

## 2019-10-05 DIAGNOSIS — E785 Hyperlipidemia, unspecified: Secondary | ICD-10-CM

## 2019-10-05 DIAGNOSIS — S92902A Unspecified fracture of left foot, initial encounter for closed fracture: Secondary | ICD-10-CM

## 2019-10-10 ENCOUNTER — Encounter: Admit: 2019-10-10 | Discharge: 2019-10-10 | Payer: MEDICARE

## 2019-10-10 NOTE — Telephone Encounter
I spoke with Rachael Black - she had not gotten my MyChart message I sent last week about her test results.    I let Rachael Black know that the preliminary results look good - will wait for Dr. Sanjuana Letters full review.    I asked Rachael Black how her blood pressure was doing since she restart Micardis on 6/29 (BP's were running 150/100).  Rachael Black replied much better and it's not a concern anymore.    Last Monday 8/2 Rachael Black fell after leaving a restaurant and shattered her arm.  She had to have surgery to repair it last Thursday.  Patient says she is doing better but still in quite a bit of pain.  I asked how it happened - patient states it was a mechanical fall going down a ramp after leaving a restaurant.  She tripped over a ridge in the sidewalk.  She assured me she has not been lightheaded or dizzy on Micardis.    I told Rachael Black I was sorry to hear about her fall but that I would pass this along to Dr. Jacqulyn Ducking to review.

## 2019-10-16 ENCOUNTER — Encounter: Admit: 2019-10-16 | Discharge: 2019-10-16 | Payer: MEDICARE

## 2019-10-16 ENCOUNTER — Ambulatory Visit: Admit: 2019-10-16 | Discharge: 2019-10-16 | Payer: MEDICARE

## 2019-10-16 DIAGNOSIS — C801 Malignant (primary) neoplasm, unspecified: Secondary | ICD-10-CM

## 2019-10-16 DIAGNOSIS — M549 Dorsalgia, unspecified: Secondary | ICD-10-CM

## 2019-10-16 DIAGNOSIS — K219 Gastro-esophageal reflux disease without esophagitis: Secondary | ICD-10-CM

## 2019-10-16 DIAGNOSIS — S62109A Fracture of unspecified carpal bone, unspecified wrist, initial encounter for closed fracture: Secondary | ICD-10-CM

## 2019-10-16 DIAGNOSIS — Z9221 Personal history of antineoplastic chemotherapy: Secondary | ICD-10-CM

## 2019-10-16 DIAGNOSIS — I10 Essential (primary) hypertension: Secondary | ICD-10-CM

## 2019-10-16 DIAGNOSIS — G35 Multiple sclerosis: Secondary | ICD-10-CM

## 2019-10-16 DIAGNOSIS — S92902A Unspecified fracture of left foot, initial encounter for closed fracture: Secondary | ICD-10-CM

## 2019-10-16 DIAGNOSIS — G629 Polyneuropathy, unspecified: Secondary | ICD-10-CM

## 2019-10-16 DIAGNOSIS — E785 Hyperlipidemia, unspecified: Secondary | ICD-10-CM

## 2019-10-16 DIAGNOSIS — S52592A Other fractures of lower end of left radius, initial encounter for closed fracture: Secondary | ICD-10-CM

## 2019-10-16 DIAGNOSIS — M1990 Unspecified osteoarthritis, unspecified site: Secondary | ICD-10-CM

## 2019-10-22 ENCOUNTER — Encounter: Admit: 2019-10-22 | Discharge: 2019-10-22 | Payer: MEDICARE

## 2019-10-22 ENCOUNTER — Ambulatory Visit: Admit: 2019-10-22 | Discharge: 2019-10-22 | Payer: MEDICARE

## 2019-10-22 DIAGNOSIS — Z9221 Personal history of antineoplastic chemotherapy: Secondary | ICD-10-CM

## 2019-10-22 DIAGNOSIS — I10 Essential (primary) hypertension: Secondary | ICD-10-CM

## 2019-10-22 DIAGNOSIS — M1990 Unspecified osteoarthritis, unspecified site: Secondary | ICD-10-CM

## 2019-10-22 DIAGNOSIS — S32591S Other specified fracture of right pubis, sequela: Secondary | ICD-10-CM

## 2019-10-22 DIAGNOSIS — G629 Polyneuropathy, unspecified: Secondary | ICD-10-CM

## 2019-10-22 DIAGNOSIS — M549 Dorsalgia, unspecified: Secondary | ICD-10-CM

## 2019-10-22 DIAGNOSIS — S62109A Fracture of unspecified carpal bone, unspecified wrist, initial encounter for closed fracture: Secondary | ICD-10-CM

## 2019-10-22 DIAGNOSIS — G35 Multiple sclerosis: Secondary | ICD-10-CM

## 2019-10-22 DIAGNOSIS — S92902A Unspecified fracture of left foot, initial encounter for closed fracture: Secondary | ICD-10-CM

## 2019-10-22 DIAGNOSIS — E785 Hyperlipidemia, unspecified: Secondary | ICD-10-CM

## 2019-10-22 DIAGNOSIS — K219 Gastro-esophageal reflux disease without esophagitis: Secondary | ICD-10-CM

## 2019-10-22 DIAGNOSIS — C801 Malignant (primary) neoplasm, unspecified: Secondary | ICD-10-CM

## 2019-10-22 NOTE — Progress Notes
S:  Rachael Black presents today for follow up of her pelvic fracture she sustained on 05/03/19 and we treated closed as well as follow up for her shoulder presumed rotator cuff pathology.  Of note she recently fractured her wrist and being followed by Dr. Dickie La for this.  She states her pelvis is doing well.  She states she is being followed for osteoporosis and recently started on Fosamax about a month ago.  She states she just started using a cane to help with her balance due to multiple falls.  She states she has been doing therapy on her pelvis and shoulder.      PE:  She walks with tandem gait and I think she walks very well.  She is NTTP greater trochanters.  NTTP along iliac wing.  NTTP to compression of her pelvis.  TTP over sacrum and states this area is always sore and states it has been this was for a couple of years.      A/P:  H/o pelvic fracture and right shoulder rotator cuff disorder    -Activity as tolerated in regards to both these extremities.   -She can follow up on a prn basis    Due to COVID-19 pandemic, I have taken down these notes in presence of Odis Hollingshead, MD using [employer]

## 2019-11-01 ENCOUNTER — Encounter: Admit: 2019-11-01 | Discharge: 2019-11-01 | Payer: MEDICARE

## 2019-11-01 DIAGNOSIS — M1990 Unspecified osteoarthritis, unspecified site: Secondary | ICD-10-CM

## 2019-11-01 DIAGNOSIS — G35 Multiple sclerosis: Secondary | ICD-10-CM

## 2019-11-01 DIAGNOSIS — C50112 Malignant neoplasm of central portion of left female breast: Secondary | ICD-10-CM

## 2019-11-01 DIAGNOSIS — K219 Gastro-esophageal reflux disease without esophagitis: Secondary | ICD-10-CM

## 2019-11-01 DIAGNOSIS — Z171 Estrogen receptor negative status [ER-]: Secondary | ICD-10-CM

## 2019-11-01 DIAGNOSIS — S92902A Unspecified fracture of left foot, initial encounter for closed fracture: Secondary | ICD-10-CM

## 2019-11-01 DIAGNOSIS — I10 Essential (primary) hypertension: Secondary | ICD-10-CM

## 2019-11-01 DIAGNOSIS — M549 Dorsalgia, unspecified: Secondary | ICD-10-CM

## 2019-11-01 DIAGNOSIS — G629 Polyneuropathy, unspecified: Secondary | ICD-10-CM

## 2019-11-01 DIAGNOSIS — C801 Malignant (primary) neoplasm, unspecified: Secondary | ICD-10-CM

## 2019-11-01 DIAGNOSIS — E785 Hyperlipidemia, unspecified: Secondary | ICD-10-CM

## 2019-11-01 DIAGNOSIS — Z9221 Personal history of antineoplastic chemotherapy: Secondary | ICD-10-CM

## 2019-11-01 DIAGNOSIS — S62109A Fracture of unspecified carpal bone, unspecified wrist, initial encounter for closed fracture: Secondary | ICD-10-CM

## 2019-11-01 LAB — CBC AND DIFF
Lab: 0 10*3/uL (ref 0–0.20)
Lab: 0.1 10*3/uL (ref 0–0.45)
Lab: 0.5 10*3/uL (ref 0–0.80)
Lab: 1 % (ref 0–2)
Lab: 1.8 10*3/uL (ref 1.0–4.8)
Lab: 10 % (ref 4–12)
Lab: 12 g/dL (ref 12.0–15.0)
Lab: 13 % (ref 11–15)
Lab: 2.9 10*3/uL (ref 1.8–7.0)
Lab: 272 10*3/uL (ref 150–400)
Lab: 3 % (ref 0–5)
Lab: 3.9 M/UL — ABNORMAL LOW (ref 4.0–5.0)
Lab: 32 g/dL (ref 32.0–36.0)
Lab: 32 pg (ref 26–34)
Lab: 33 % (ref 24–44)
Lab: 39 % (ref 36–45)
Lab: 5.3 10*3/uL (ref 4.5–11.0)
Lab: 53 % (ref 41–77)
Lab: 6.2 FL — ABNORMAL LOW (ref 7–11)
Lab: 99 FL (ref 80–100)

## 2019-11-01 NOTE — Progress Notes
Date of Service: 11/01/2019      Subjective:             Reason for Visit:  Heme/Onc Care      Rachael Black is a 74 y.o. female.      History of Present Illness  PRIMARY PROVIDER: Dr. Merian Capron  SURGEON: Dr. 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  She had her regular screening mammogram in November 2015. She palpated a mass in the left breast that led to diagnostic imaging march 2017. 2 cm mass was found on imaging studies at 12:00 position for which she underwent stereotactic biopsy.  Pathology was consistent with an invasive ductal carcinoma, triple negative, grade 3 with Ki-67 of 68 percent.  Denies pain, nipple discharge, skin changes. She had excisional biopsy on right outer lower quadrant for a cyst long time back.   Patient could not participate in clinical trial due to underlying history of multiple sclerosis.  She received neoadjuvant chemotherapy with dose dense AC and weekly Taxol.  Could not receive 12 cycles of  Taxol due to neuropathy.  She underwent lumpectomy and sentinel lymph node biopsy with Dr. [name] 12/14/2015, pathology showed pathological CR.  Lymph nodes were not involved.   Due to palpable mass/cyst, mammogram and [place] were done Feb 2018, cyst was found and aspirated.    Patient was admitted May 2019 due to anemia. ?Suspicion for GI bleed. ?heme occult positive. ?Colonoscopy showed rectal polyp, biopsies were negative for H. pylori and celiac disease.   No evidence of hemolysis.   Iron study showed severe iron deficiency. ?3 doses of IV Venofer, started on oral iron supplementation.   No plans for further endoscopy unless anemia does not improve per GI.   Also noted B12 deficiency. ?With level of 118. ?Started on IM B 12 injection.   Vit D low at 20.1    TREATMENT HISTORY:   Dose dense AC 07/20/15 - 08/31/15  Weekly Taxol 09/14/15 - 11/23/15.  12th cycle not given due to neuropathy  Completed radiation 02/09/2016    INTERVAL HISTORY: Patient is here for follow-up. Accompanied by her husband.  She had a fall, fractured her left forearm.  Had to undergo surgery.  Has been started on Fosamax for osteoporosis by Dr. Selena Batten her primary care physician.  Has been taking it for the last few weeks.  Balance has been a problem.  She completed physical therapy for gait after her pelvis fracture.  Does not believe neuropathy from chemotherapy had huge impact on her gait.  Underlying multiple sclerosis, not in any active treatment.  Since Dr. Aileen Pilot her previous neurologist has left she has not been seeing anyone for this diagnosis.     Review of Systems   Constitutional: Negative for activity change, appetite change, chills, diaphoresis, fatigue and fever.   HENT: Negative.    Eyes: Negative.    Respiratory: Negative for cough and shortness of breath.    Cardiovascular: Negative for chest pain and palpitations.   Gastrointestinal: Negative for abdominal pain, blood in stool, constipation, diarrhea, nausea and vomiting.   Genitourinary: Negative.    Musculoskeletal: Negative for arthralgias.        Pain in the left forearm since the surgery and fracture.   Skin: Negative for rash.  Neurological: Positive for numbness. Negative for dizziness, weakness, light-headedness and headaches.   Hematological: Negative.    Psychiatric/Behavioral: Negative.        PAST MEDICAL HISTORY: Multiple sclerosis, GERD  FAMILY HISTORY: Reviewed and non contributory. No history of breast, ovarian, endometrial cancer  OB/GYN HISTORY:  Age at first Menarche:? 2  Age at Menopause:? 62, took HRT x 2 years  Age at First Live Birth:? 9  Gravida:? 2  Para: 2  Breastfeeding:? No  SOCIAL HISTORY: Non smoker. No alcohol or drug use    Objective:         ? acetaminophen SR(+) (TYLENOL) 650 mg tablet Take 1,950 mg by mouth twice daily.   ? alendronate (FOSAMAX) 70 mg tablet Take one tablet by mouth every 7 days. Take at least 30 minutes before breakfast with plain water. Do not lie down for 30 minutes.   ? aspirin EC 81 mg tablet Take 81 mg by mouth daily. Take with food.   ? cholecalciferol (VITAMIN D-3) 1,000 units tablet Take 1,000 Units by mouth daily.   ? famotidine (PEPCID) 20 mg tablet Take one tablet by mouth twice daily. Indications: take this medication while taking naprosyn to avoid stomach bleeding   ? gabapentin (NEURONTIN) 300 mg capsule Take 3 caps twice a day.  Indications: neuropathic pain   ? pantoprazole DR (PROTONIX) 20 mg tablet Take one tablet by mouth daily.   ? rosuvastatin (CRESTOR) 10 mg tablet Take one tablet by mouth daily.   ? telmisartan (MICARDIS) 20 mg tablet Take one tablet by mouth daily.     Vitals:    11/01/19 1035   BP: 131/71   Pulse: 84   Resp: 18   Temp: 37 ?C (98.6 ?F)   SpO2: 97%   Weight: 80.9 kg (178 lb 6.4 oz)   Height: 165.1 cm (65)   PainSc: Zero     Body mass index is 29.69 kg/m?Marland Kitchen     Pain Score: Zero         Pain Addressed:  N/A    Patient Evaluated for a Clinical Trial: Patient currently in screening for a treatment clinical trial.     Eastern Cooperative Oncology Group performance status is 1, Restricted in physically strenuous activity but ambulatory and able to carry out work of a light or sedentary nature, e.g., light house work, office work.     Physical Exam  Constitutional:       General: She is not in acute distress.     Appearance: She is well-developed.   HENT:      Mouth/Throat:      Pharynx: No oropharyngeal exudate.   Eyes:      Conjunctiva/sclera: Conjunctivae normal.      Comments: Left eye ptosis    Cardiovascular:      Rate and Rhythm: Regular rhythm.      Heart sounds: Normal heart sounds. No murmur heard.        Comments:     Pulmonary:      Effort: Pulmonary effort is normal.      Breath sounds: Normal breath sounds. No wheezing.   Chest:       Abdominal:      Palpations: There is no mass.      Tenderness: There is no abdominal tenderness.   Musculoskeletal:         General: Normal range of motion.      Cervical back: Neck supple.   Lymphadenopathy:      Cervical:  No cervical adenopathy.   Skin:     Findings: No rash.   Neurological:      Mental Status: She is alert and oriented to person, place, and time.   Psychiatric:         Behavior: Behavior normal.         Thought Content: Thought content normal.         Judgment: Judgment normal.               Assessment and Plan:  1. Left breast IDC, triple negative. 2.5 cm on clinical exam, cT2cN0 stage IIA disease.  MRI showed 1.6 cm area, likely T1c disease.  2. Completed neoadjuvant chemotherapy with dd AC and weekly taxol.  Pathological CR seen at the time of surgery.  3. Completed radiation with Dr. Janee Morn 02/09/16.  Scheduled for mammogram October 2020.  4.  Lymphedema of the breast, mild issues. We discussed massages  5.  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  Does not feel its been contributing to her falls or gait issues.  Has completed physical therapy in the past.  Does not feel it is needed again now.  Using a cane.  Encouraged for her to work on those exercises and let us know if physical therapy needs to be reevaluated.  6.  Anemia.  Labs today show normal hemoglobin.  7.  She has had B12 levels checked in the past due to macrocytosis and have been variable.  On oral supplementation currently.  Levels from today pending.  8.  Multiple sclerosis, long-standing history.  Deviously followed by Dr. Aileen Pilot.  Has not been seen anyone since he left.  Referral placed to [facility].?  Balance issues, falls secondary to multiple sclerosis.    Return to clinic 12 mos or sooner as needed, after her mammogram October 2022.  Parts of this note were created with voice recognition software. Please excuse any grammatical or typographical errors.

## 2019-11-06 ENCOUNTER — Encounter: Admit: 2019-11-06 | Discharge: 2019-11-06 | Payer: MEDICARE

## 2019-11-06 ENCOUNTER — Ambulatory Visit: Admit: 2019-11-06 | Discharge: 2019-11-06 | Payer: MEDICARE

## 2019-11-06 DIAGNOSIS — S52592A Other fractures of lower end of left radius, initial encounter for closed fracture: Secondary | ICD-10-CM

## 2019-11-06 DIAGNOSIS — M549 Dorsalgia, unspecified: Secondary | ICD-10-CM

## 2019-11-06 DIAGNOSIS — M1990 Unspecified osteoarthritis, unspecified site: Secondary | ICD-10-CM

## 2019-11-06 DIAGNOSIS — C801 Malignant (primary) neoplasm, unspecified: Secondary | ICD-10-CM

## 2019-11-06 DIAGNOSIS — Z9221 Personal history of antineoplastic chemotherapy: Secondary | ICD-10-CM

## 2019-11-06 DIAGNOSIS — I10 Essential (primary) hypertension: Secondary | ICD-10-CM

## 2019-11-06 DIAGNOSIS — S92902A Unspecified fracture of left foot, initial encounter for closed fracture: Secondary | ICD-10-CM

## 2019-11-06 DIAGNOSIS — K219 Gastro-esophageal reflux disease without esophagitis: Secondary | ICD-10-CM

## 2019-11-06 DIAGNOSIS — S62109A Fracture of unspecified carpal bone, unspecified wrist, initial encounter for closed fracture: Secondary | ICD-10-CM

## 2019-11-06 DIAGNOSIS — G35 Multiple sclerosis: Secondary | ICD-10-CM

## 2019-11-06 DIAGNOSIS — E785 Hyperlipidemia, unspecified: Secondary | ICD-10-CM

## 2019-11-06 DIAGNOSIS — G629 Polyneuropathy, unspecified: Secondary | ICD-10-CM

## 2019-11-06 NOTE — Telephone Encounter
This RN spoke with pt and informed pt of Dr.Satelli recommendations. Pt would like to pursue the parenteral injections route. This RN informed pt that orders will be placed and that scheduling will contact patient with details. Pt stated understanding and had no further questions.

## 2019-11-06 NOTE — Telephone Encounter
-----   Message from Laurence Slate, MD sent at 11/05/2019  3:54 PM CDT -----  B12 low.  Preferably I would start parenteral injections.  See what she thinks about it.    Also if she is not taking the oral supplementation, maybe she can go back on it and we can repeat lab tests in 2 to 3 months to see if we need to do IM injections

## 2019-11-06 NOTE — Progress Notes
Hand Therapy Orthosis/Evaluation and Plan of Care:     Name: Rachael Black, Sisler  DOB: 01/08/1946  MRN#: [id number]  Referring Physician:  Hector Brunswick, MD  Insurance: Medicare, Tricare  Injury/Onset Date: 09/30/2019   Surgical Date: 10/03/2019  Medical Diagnosis: S/P ORIF  Left distal radius and ulnar fractures  Treatment Diagnosis: Same plus stiffness  Date of Initial Evaluation: 11/06/2019  Follow-up Physician Appointment:  Visit #  1       Subjective: I a not having a lot of pain.    Primary Concern/Chief Complaint:    Mental Status/Cognitive Function: Alert/Able to Follow Instructions/Cooperative    Co-morbidities:  None    PMH/Functional Limitations:  None    Previous History of Similar Symptoms:  None    Previous Treatment for Similar Symptoms:  None    Current Functional Status:      ADL/Self-Care Impairment: Require minimal assistance    Work Task Impairment: NA    Current Functional Limitations: Immobilization with cast/orthosis/prefabricated orthosis, Decreased UE ROM/strength, Decreased independence in ADLs and Other: Pain    Assistive Devices:  None    Durable Medical Equipment:  None    Employment/Work Status: Comments: Age    Home Situation: Lives with Spouse    Home Health Care:  None    Home Environment: Not Relevant to UE injury/condition     History of Present Condition/Mechanism of Injury:    Pain: Left:  Location: Wrist    Pain Rating:   Current: 3/10     Patient Goals: To regain ROM and function of left wrist      Objective: Patient's husband present    Evaluation:    WRIST/FOREARM    Wrist Extension/Flexion 55/46   Wrist Radial/Ulnar Deviation 20/25   Forearm Pronation/Supination WNL/70     Fingers/thumb ROM WNL    Hand Exam     Treatment:     Left:  Forearm and Wrist     Treatment Provided:    Therapeutic Exercise: AROM    Education:Handout Issued: Active Wrist and Forearm Exercises             Orthosis:   Patient to where wrist brace at all times except hygiene and exercise x2 weeks. Assessment: Patient to benefit from wrist brace and exercise.    Problems: Decreased PROM, Decreased AROM, Decreased Strength, Bony Instability, Decreased Self-Care/ADL Independence and Pain    Rationale for Therapy:Clinical Judgment and Evaluation    Rehab Potential: Good     Contraindications to Therapy:none    Long Term Treatment Goals:    Increase AROM while decreasing stiffness, swelling and pain to improve functional performance.    Short Term Treatment Goals:      Goal Number:  1  Goal: Patient to verbalize/demonstrate exercises correctly.  Goal Status:  Met   Goal Number:  2      Plan Of Care:    Treatment Plan:     Treatment to be provided:Exercise: AROM    Frequency of treatment:  To follow home exercise program, To follow home orthosis program

## 2019-11-07 ENCOUNTER — Encounter: Admit: 2019-11-07 | Discharge: 2019-11-07 | Payer: MEDICARE

## 2019-11-07 DIAGNOSIS — Z8639 Personal history of other endocrine, nutritional and metabolic disease: Secondary | ICD-10-CM

## 2019-11-07 DIAGNOSIS — C50112 Malignant neoplasm of central portion of left female breast: Secondary | ICD-10-CM

## 2019-11-07 DIAGNOSIS — E538 Deficiency of other specified B group vitamins: Secondary | ICD-10-CM

## 2019-11-07 MED ORDER — CYANOCOBALAMIN (VITAMIN B-12) 1,000 MCG/ML IJ SOLN
1000 ug | Freq: Once | INTRAMUSCULAR | 0 refills
Start: 2019-11-07 — End: ?

## 2019-11-14 ENCOUNTER — Encounter: Admit: 2019-11-14 | Discharge: 2019-11-14 | Payer: MEDICARE

## 2019-11-14 DIAGNOSIS — E538 Deficiency of other specified B group vitamins: Secondary | ICD-10-CM

## 2019-11-14 MED ORDER — CYANOCOBALAMIN (VITAMIN B-12) 1,000 MCG/ML IJ SOLN
1000 ug | Freq: Once | INTRAMUSCULAR | 0 refills | Status: CP
Start: 2019-11-14 — End: ?
  Administered 2019-11-14: 16:00:00 1000 ug via INTRAMUSCULAR

## 2019-11-14 NOTE — Progress Notes
Patient here today for Vitamin B12 injection. IM injection given to right deltoid, patient tolerated well. To return as scheduled.

## 2019-12-04 ENCOUNTER — Encounter: Admit: 2019-12-04 | Discharge: 2019-12-04 | Payer: MEDICARE

## 2019-12-09 ENCOUNTER — Ambulatory Visit: Admit: 2019-12-09 | Discharge: 2019-12-09 | Payer: MEDICARE

## 2019-12-09 ENCOUNTER — Encounter: Admit: 2019-12-09 | Discharge: 2019-12-09 | Payer: MEDICARE

## 2019-12-09 DIAGNOSIS — R490 Dysphonia: Secondary | ICD-10-CM

## 2019-12-09 DIAGNOSIS — S62109A Fracture of unspecified carpal bone, unspecified wrist, initial encounter for closed fracture: Secondary | ICD-10-CM

## 2019-12-09 DIAGNOSIS — C801 Malignant (primary) neoplasm, unspecified: Secondary | ICD-10-CM

## 2019-12-09 DIAGNOSIS — Z9221 Personal history of antineoplastic chemotherapy: Secondary | ICD-10-CM

## 2019-12-09 DIAGNOSIS — S92902A Unspecified fracture of left foot, initial encounter for closed fracture: Secondary | ICD-10-CM

## 2019-12-09 DIAGNOSIS — E785 Hyperlipidemia, unspecified: Secondary | ICD-10-CM

## 2019-12-09 DIAGNOSIS — G629 Polyneuropathy, unspecified: Secondary | ICD-10-CM

## 2019-12-09 DIAGNOSIS — K219 Gastro-esophageal reflux disease without esophagitis: Secondary | ICD-10-CM

## 2019-12-09 DIAGNOSIS — M549 Dorsalgia, unspecified: Secondary | ICD-10-CM

## 2019-12-09 DIAGNOSIS — G35 Multiple sclerosis: Secondary | ICD-10-CM

## 2019-12-09 DIAGNOSIS — M1990 Unspecified osteoarthritis, unspecified site: Secondary | ICD-10-CM

## 2019-12-09 DIAGNOSIS — R1314 Dysphagia, pharyngoesophageal phase: Secondary | ICD-10-CM

## 2019-12-09 DIAGNOSIS — I10 Essential (primary) hypertension: Secondary | ICD-10-CM

## 2019-12-09 NOTE — Progress Notes
Date of Service: 12/09/2019    Subjective:             Rachael Black is a 74 y.o. female.    History of Present Illness    Rachael Black is a very pleasant 74 y.o. woman who is referred by Dr. Lauris Chroman for a complaint of dysphagia.      Rachael Black reports that she chokes frequently.  She has learned to take smaller bites and take times during meals.  She really has more issues with liquids, which is what more commonly makes her choke.  This occurs several times per week (almost daily).  Often she eats/drinks watching TV with her feet up.  She has never had a pneumonia due to dysphagia.  She is not avoiding foods.    Specific voice complaints include a fluctuating vocal quality.  She can identify no triggers for this.     She rarely reports frank reflux symptoms such as retrosternal burning/pain after meals, regurgitation or a sour brash sensation in the back of the throat.      VOCAL DEMANDS/HYGIENE:  Rachael Black works as a Lawyer.  She reports that She is talkative.  Specific vocal demands include conversation voice.  She reports that She drinks 16 oz of water per day and 12 oz of caffeinated beverage daily.  She is not currently smoking.    Family History   Problem Relation Age of Onset   ? Arthritis-osteo Mother    ? Thyroid Disease Mother    ? Heart Disease Father    ? High Cholesterol Father    ? Cancer Maternal Aunt    ? Cancer Maternal Uncle    ? Cancer Paternal Uncle    ? Cancer-Colon Maternal Grandmother 35   ? Stroke Maternal Grandmother    ? Cancer-Colon Paternal Grandmother        Social History     Socioeconomic History   ? Marital status: Married     Spouse name: Not on file   ? Number of children: Not on file   ? Years of education: Not on file   ? Highest education level: Not on file   Occupational History   ? Not on file   Tobacco Use   ? Smoking status: Former Smoker     Packs/day: 1.00     Years: 10.00     Pack years: 10.00     Types: Cigarettes Quit date: 02/28/2005     Years since quitting: 14.7   ? Smokeless tobacco: Never Used   Vaping Use   ? Vaping Use: Never used   Substance and Sexual Activity   ? Alcohol use: Yes     Alcohol/week: 8.0 standard drinks     Types: 8 Standard drinks or equivalent per week   ? Drug use: No   ? Sexual activity: Not on file   Other Topics Concern   ? Not on file   Social History Narrative    Patient lives with husband. 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         Review of Systems   Constitutional: Negative.    HENT: Positive for trouble swallowing and voice change.    Eyes: Negative.    Respiratory: Positive for cough and choking.    Cardiovascular: Negative.    Gastrointestinal: Negative.    Endocrine: Negative.    Genitourinary: Negative.    Musculoskeletal: Negative.    Skin: Negative.  Allergic/Immunologic: Negative.    Neurological: Negative.    Hematological: Negative.    Psychiatric/Behavioral: Negative.          Objective:         ? acetaminophen SR(+) (TYLENOL) 650 mg tablet Take 1,950 mg by mouth twice daily.   ? alendronate (FOSAMAX) 70 mg tablet Take one tablet by mouth every 7 days. Take at least 30 minutes before breakfast with plain water. Do not lie down for 30 minutes.   ? aspirin EC 81 mg tablet Take 81 mg by mouth daily. Take with food.   ? cholecalciferol (VITAMIN D-3) 1,000 units tablet Take 1,000 Units by mouth daily.   ? famotidine (PEPCID) 20 mg tablet Take one tablet by mouth twice daily. Indications: take this medication while taking naprosyn to avoid stomach bleeding   ? gabapentin (NEURONTIN) 300 mg capsule Take 3 caps twice a day.  Indications: neuropathic pain   ? pantoprazole DR (PROTONIX) 20 mg tablet Take one tablet by mouth daily.   ? rosuvastatin (CRESTOR) 10 mg tablet Take one tablet by mouth daily.   ? telmisartan (MICARDIS) 20 mg tablet Take one tablet by mouth daily.     Vitals:    12/09/19 1014   BP: (!) 144/85   BP Source: Arm, Right Upper   Patient Position: Sitting   Pulse: 80   Temp: 36.4 ?C (97.6 ?F)   TempSrc: Temporal   Weight: 78.9 kg (174 lb)   Height: 165.1 cm (65)   PainSc: Zero     Body mass index is 28.96 kg/m?Marland Kitchen     Physical Exam    EXAM:  Constitutional: Blood pressure (!) 144/85, pulse 80, temperature 36.4 ?C (97.6 ?F), temperature source Temporal, height 165.1 cm (65), weight 78.9 kg (174 lb).    alert, cooperative and no distress  Neurologic: Alert and oriented x 3.    Cranial nerves II - XII grossly intact and symmetric.   Eyes:  Pupils equal round and reactive.       Extraocular eye movements intact and full.  Ears:  Pinna appear normal.  Hearing intact to finger rustle.    External auditory canals without significant cerumen or debris.    Tympanic membrane intact without effusion, retraction or perforation.  Nose:  Dorsum straight.  Nares patent.    On anterior rhinoscopy,  the mucosa is moist.  Anterior turbinates 2+.    Septum deviates right.  No mucopurulent discharge or polyps.  Oral Cavity: Lips without cutaneous lesion.  Oral competence intact.    Dentition is in good repair.  Oral mucosa pink and moist.    No masses or lesions.      Tongue mobile.  Floor of mouth without mass or lesion.      Oropharynx: No mucosal masses or lesions.  Tonsils minimal tissue.    Soft palate elevates symmetrically.  Neck:  Soft, non-tender.  No appreciable lymphadenopathy.    Trachea midline.  Laryngeal landmarks palpable with normal crepitus.    Thyroid without mass or tenderness.  Mobile with swallow.  Respiratory: Breathing comfortably.  No suprasternal/suprclavicular retractions    or accessory muscle use.  No stridor.    RSI  Hoarseness or a problem with your voice 3   Clearing your throat 3   Excess throat mucus or postnasal drip 0   Difficulty swallowing food, liquids, or pills 2   Coughing after you ate or after lying down 2   Breathing difficulties or choking episodes 4  Troublesome or annoying cough  4   Sensations of something sticking in your throat or a lump in your throat 3   Heartburn, chest pain, indigestion, or stomach acid coming up  2   Total:  23     EAT-10  My swallowing problem has caused me to lose weight. 0   My swallowing problem interferes with my ability to go out for meals.  0   Swallowing liquids takes extra effort. 2   Swallowing solids takes extra effort.  0   Swallowing pills takes extra effort.  0   Swallowing is painful. 0   The pleasure of eating is affected by my swallowing.  1   When I swallow food sticks in my throat. 1   I cough when I eat.  2   Swallowing is stressful.  1   Total:    7     Clinic Note Excerpt Dr. Edmon Crape Tsue 10/11/19:  1. 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 We discussed the risks and merits of each Tx approach including serial exams, serial [place], FNA, Molecular testing, Thyroid Scan and surgery. Reviewed interval studies and notes. 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 Dysphagia with food catching and small bites. Thyroid upper limit normal size. Does have Video swallow and speech eval/laryngology    Videoswallow 08/19/19:  IMPRESSIONS:  Based on the above findings, Rachael Black presents with Noms level 6 minimal pharyngeal dysphagia, characterized primarily by intermittently delayed onset of pharyngeal phase of swallow, reduced anterior hyoid excursion, reduced pharyngeal stripping wave, slightly reduced pharyngeal contraction, reduced PE segment opening (non-obstructive), mildly reduced BOT retraction, and increased pharyngeal residue. One instances of penetration of thin liquid was appreciated when swallowing the barium tablet. Additional penetration or aspiration was not observed. With adherence to the following diet and swallow recommendations, patient's risk of aspiration and subsequent pneumonia is low at this time.    EGD 07/19/2016:  Large hiatal hernia.  Antral gastritis. Z-line irregular       Assessment and Plan:    ASSESSMENT:  Encounter Diagnoses   Name Primary?   ? Dysphonia Yes   ? Pharyngoesophageal dysphagia        PLAN:    I have reviewed my findings with Rachael Black.    Her primary concern is dysphagia, predominantly for liquids.  She is learned to make dietary modifications that reduce the incidence of coughing and choking.  I personally reviewed and interpreted the images of her video swallow today.  Aside from some mild vallecular piriform sinus residue, her pharyngeal swallow looks quite good.  We discussed the possibility of a single session of therapy, but she declined the offer for this at this time.  We have agreed to continue to monitor her symptoms as she is compensating adequately with strategies.    As to her voice, she notices fluctuating issues with her voice.  She not interested in therapy at this time, and so stroboscopy was deferred.  I reassured her that her laryngeal exam was normal.    She will continue to follow with Dr. Royce Macadamia for her thyroid nodule.  If she has progression or worsening of any of her swallow or voice symptoms, I would be happy to see her back as needed.

## 2019-12-10 ENCOUNTER — Encounter: Admit: 2019-12-10 | Discharge: 2019-12-10 | Payer: MEDICARE

## 2019-12-10 ENCOUNTER — Ambulatory Visit: Admit: 2019-12-10 | Discharge: 2019-12-10 | Payer: MEDICARE

## 2019-12-10 DIAGNOSIS — G620 Drug-induced polyneuropathy: Secondary | ICD-10-CM

## 2019-12-10 DIAGNOSIS — G35 Multiple sclerosis: Secondary | ICD-10-CM

## 2019-12-10 DIAGNOSIS — F419 Anxiety disorder, unspecified: Secondary | ICD-10-CM

## 2019-12-10 DIAGNOSIS — I10 Essential (primary) hypertension: Secondary | ICD-10-CM

## 2019-12-10 DIAGNOSIS — K219 Gastro-esophageal reflux disease without esophagitis: Secondary | ICD-10-CM

## 2019-12-10 DIAGNOSIS — M549 Dorsalgia, unspecified: Secondary | ICD-10-CM

## 2019-12-10 DIAGNOSIS — E785 Hyperlipidemia, unspecified: Secondary | ICD-10-CM

## 2019-12-10 DIAGNOSIS — E538 Deficiency of other specified B group vitamins: Secondary | ICD-10-CM

## 2019-12-10 DIAGNOSIS — S92902A Unspecified fracture of left foot, initial encounter for closed fracture: Secondary | ICD-10-CM

## 2019-12-10 DIAGNOSIS — Z0000 Encounter for general adult medical examination without abnormal findings: Secondary | ICD-10-CM

## 2019-12-10 DIAGNOSIS — Z9221 Personal history of antineoplastic chemotherapy: Secondary | ICD-10-CM

## 2019-12-10 DIAGNOSIS — S62109A Fracture of unspecified carpal bone, unspecified wrist, initial encounter for closed fracture: Secondary | ICD-10-CM

## 2019-12-10 DIAGNOSIS — Z23 Encounter for immunization: Secondary | ICD-10-CM

## 2019-12-10 DIAGNOSIS — E782 Mixed hyperlipidemia: Secondary | ICD-10-CM

## 2019-12-10 DIAGNOSIS — M810 Age-related osteoporosis without current pathological fracture: Secondary | ICD-10-CM

## 2019-12-10 DIAGNOSIS — G629 Polyneuropathy, unspecified: Secondary | ICD-10-CM

## 2019-12-10 DIAGNOSIS — I2510 Atherosclerotic heart disease of native coronary artery without angina pectoris: Secondary | ICD-10-CM

## 2019-12-10 DIAGNOSIS — C801 Malignant (primary) neoplasm, unspecified: Secondary | ICD-10-CM

## 2019-12-10 DIAGNOSIS — E559 Vitamin D deficiency, unspecified: Secondary | ICD-10-CM

## 2019-12-10 DIAGNOSIS — M1990 Unspecified osteoarthritis, unspecified site: Secondary | ICD-10-CM

## 2019-12-10 NOTE — Progress Notes
Date of Service: 12/10/2019    Rachael Black is a 74 y.o. female.  DOB: [birth year]  MRN: [id number]   she was last seen by me 06/14/2019      Subjective:            She presents today for an Annual Medicare Wellness visit. She was last seen by me 06/14/2019    Chief Complaint   Patient presents with   ? Physical       History of Present Illness    Drug-induced polyneuropathy (HCC)  From previous chemo/radiation. She is on gabapentin, which helps her symptoms. Symptoms are in feet and in lower legs.  ?  Multiple sclerosis [facility])  Patient has not seen neurology for some time now. She does not take any medications. She does not take a vit D supplement. Previously followed by Dr. Aileen Pilot. Referral placed to neurology.  ?  Gastroesophageal reflux disease, unspecified whether esophagitis present  Patient is on protonix and symptoms are well controlled. She has a hx of esophogeal erosions. EGD was last in 2018  ?  Mixed hyperlipidemia  Patient is compliant with statin. She denies side effects. She started statin in March 2021.  ?  Pulmonary nodule  CT June 2021, may consider follow up CT in 6 months.  ?  Thyroid nodule  [place] with left lobe thyroid nodule. Recommended biopsy, which was benign.  Patient has scheduled thyroid ultrasound in the next few months.  ?  Coronary Artery calcifications, moderate  Seen on CT March 2021. Patient is currently on statin and aspirin. Echo August 2021 normal EF.     HTN  Patient is currently treated with telmisartan.  She has been on this medication for several years without side effects.  Blood pressure at home measures about 120-130/80. Blood pressure is well controlled today.  ?  Vit B12 def.   Low Sept 2021. Is receiving B12 shots through the [place] [facility]. Dr. Atlee Abide D def  Normal April 2021, Supplementing with 1000IU daily.  ?  Chronic back pain/Scoliosis/Lumbar spinal stenosis  There is moderate left convexity curvature of the lumbar spine. Multilevel   degenerative changes in the lumbar spine, asymmetrically greater along the   right aspect of the L2-3 and L3-4 levels and at L4-5 on the left due to   scoliotic curvature. There is a mild central canal narrowing and moderate   neural foraminal narrowing.     Osteoporosis  Patient was started on Fosamax April 2021. She is tolerating this medication well without side effects.     Breast Cancer  Diagnosed in 2017. Treated with chemo, lumpectomy, and radiation. Remission in 2018. She follows with [place] [facility] every year.     Preventative Medicine:  Colonoscopy-2018 with recommended follow up in 5 years.  Mammogram-2020 normal, follows with [facility]  Pap-no hx of abnormal pap  Dexa-2021 osteoporosis  Tdap-2015  Shingles-completed 2019    Medical History:   Diagnosis Date   ? Arthritis    ? Back pain    ? Cancer (HCC) 06/2015    Breast    ? Foot fracture, left 09/26/2016   ? GERD (gastroesophageal reflux disease)    ? History of chemotherapy     neoadjuvant chemotherapy 2017   ? HLD (hyperlipidemia)    ? Hypertension    ? Multiple sclerosis (HCC) 1990   ? Neuropathy    ? Wrist fracture 09/26/2016    heads of ulna and radius  Surgical History:   Procedure Laterality Date   ? HX RHINOPLASTY  1974   ? HX TUBAL LIGATION  1976   ? ANKLE SURGERY Right 2000    with instrumentation   ? Left radioactive seed localized lumpectomy, left axillary sentinel lymph node biopsy  Left 12/14/2015    Performed by Cordelia Poche, MD at [facility] OR   ? Left distal radius and ulna open reduction internal fixation Left 10/03/2019    Performed by Desiree Hane, MD at [facility] OR   ? Left distal radius and ulna open reduction internal fixation Left 10/03/2019    Performed by Desiree Hane, MD at [facility] OR   ? COLONOSCOPY     ? FOOT SURGERY       Family History   Problem Relation Age of Onset   ? Arthritis-osteo Mother    ? Thyroid Disease Mother    ? Heart Disease Father    ? High Cholesterol Father    ? Cancer Maternal Aunt    ? Cancer Maternal Uncle    ? Cancer Paternal Uncle    ? Cancer-Colon Maternal Grandmother 34   ? Stroke Maternal Grandmother    ? Cancer-Colon Paternal Grandmother      Social History     Socioeconomic History   ? Marital status: Married     Spouse name: Not on file   ? Number of children: Not on file   ? Years of education: Not on file   ? Highest education level: Not on file   Occupational History   ? Not on file   Tobacco Use   ? Smoking status: Former Smoker     Packs/day: 1.00     Years: 10.00     Pack years: 10.00     Types: Cigarettes     Quit date: 02/28/2005     Years since quitting: 14.7   ? Smokeless tobacco: Never Used   Vaping Use   ? Vaping Use: Never used   Substance and Sexual Activity   ? Alcohol use: Yes     Alcohol/week: 8.0 standard drinks     Types: 8 Standard drinks or equivalent per week   ? Drug use: No   ? Sexual activity: Not on file   Other Topics Concern   ? Not on file   Social History Narrative    Patient lives with husband. 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       I reviewed medications, allergies, problem list and tobacco history at this visit.    A Health Risk assessment was performed by the patient today & reviewed with the patient.  Health Risk Assessment Questionnaire  Current Care  List of Providers you have seen in the last two years: See my chart!  Are you receiving home health?: No  During the past 4 weeks, how would you rate your health in general?: Good    Outside Care  Since your last PCP visit, have you received care outside of [facility]?: No  What type of care did you receive outside of [facility]? (select all that apply): (not recorded)    Physical Activity  Do you exercise or are you physically active?: (!) No chronic msk pain    Diet  In the past month, were you worried whether your food would run out before you or your family had money to buy more?: No  In the past 7  days, how many times did you eat fast food or junk food or pizza?: 1  In the past 7 days, how many servings of fruits or vegetables did you eat each day?: (!) 2-3  In the past 7 days, how many sodas and sugar sweetened drinks (regular, not diet) did you drink each day?: 0    Smoke/Tobacco Use  Are you currently a smoker?: No      Alcohol Use  Do you drink alcohol?: Yes  Are you Female or Female?: (P) Female  Female: In the last three months, have you had >3 alcoholic beverages in any one day or >7 in any one week?: (!) Yes      Depression Screen  Little interest or pleasure in doing things: Not at All  Feeling down, depressed or hopeless: Not at All  Patient Scores:  PHQ-2: PHQ-2 Score: 0 (12/04/2019  9:16 AM)    PHQ-9: No data recorded  Interventions:  PHQ-2: No data recorded  Depression Interventions PHQ-2/9: No data recorded      Pain  How would you rate your pain today?: (!) Mild pain    Ambulation  Do you use any assistive devices for ambulation?: (!) Yes  What types of device? (select all that apply): (P) Another person, Cane    Fall Risk  Does it take you longer than 30 seconds to get up and out of a chair?: No  Have you fallen in the past year?: (!) Yes  Fall History (last 46mo): (!) Any Fall with Major Injury mechanical falls    Motor Vehicle Safety  Do you fasten your seat belt when you are in the car?: Yes    Sun Exposure  Do you protect yourself from the sun? For example, wear sunscreen when outside.: Yes    Hearing Loss  Do you have trouble hearing the television or radio when others do not?: No  Do you have to strain or struggle to hear/understand conversation?: No  Do you use hearing aids?: No    Cognitive Impairment  During the past 12 months, have you experienced confusion or memory loss that is happening more often or is getting worse?: No    Functional Screen  Do you live alone?: No  Do you live at: Home  Can you drive your own car or travel alone by bus or taxi?: Yes  Can you shop for groceries or clothes without help?: Yes  Can you prepare your own meals?: Yes  Can you do your own housework without help?: (!) No Has help coming to the home.  Can you handle your own money without help?: Yes  Do you need help eating, bathing, dressing, or getting around your home?: No  Do you feel safe?: Yes  Does anyone at home hurt you, hit you, or threaten you?: No  Have you ever been the victim of abuse?: No    Home Safety  Does your home have grab bars in the bathroom?: (!) No  Does your home have hand rails on stairs and steps?: Yes  Does your home have functioning smoke alarms?: Yes    Advance Directive  Do you have a living will or Advance Directive?: Yes      Dental Screen  Have you had an exam by your dentist in the last year?: Yes    Vision Screen  Do you have diabetes?: No          In the last 12 months, did you ever eat less than  you should because there wasn't enough money for food?: No (06/14/2019  7:34 AM)  In the last 12 months, has your utility company shut off your service for not paying your bills?: No (06/14/2019  7:34 AM)  Are you worried that in the next 2 months, you may not have stable housing?: No (06/14/2019  7:34 AM)  Are you afraid you might be hurt in your home by someone you know?: No (06/14/2019  7:34 AM)  Are you afraid you might be hurt in your apartment building or neighborhood?: No (06/14/2019  7:34 AM)  Do problems getting child care make it difficult for you to work or study?: No (06/14/2019  7:34 AM)  In the last 12 months, have you needed to see a doctor, but could not because of cost?: No (06/14/2019  7:34 AM)  In the last 12 months, did you skip medications to save money?: No (06/14/2019  7:34 AM)  In the last 12 months, have you ever had to go without health care because you didn't have a way to get there?: No (06/14/2019  7:34 AM)  Do you have problems understanding what is told to you about your medical conditions?: No (06/14/2019  7:34 AM)  Do you often feel that you lack companionship?: No (06/14/2019  7:34 AM)  If you answered YES to any questions above, would you like to discuss help with your social work team?: No (06/14/2019  7:34 AM)        Personal prevention Plan reviewed with patient and a copy was given via the After Visit Summary.    While the patient was here today, due to his/her multiple chronic conditions it would be in the best interest of the patient for me to monitor, assess and evaluate those as well. They are as follows.  Patient Active Problem List    Diagnosis Date Noted   ? Primary hypertension 12/10/2019   ? Age-related osteoporosis without current pathological fracture 12/10/2019   ? Closed fracture of left distal radius 10/15/2019   ? Rotator cuff disorder, right 09/10/2019   ? Dysphagia, pharyngoesophageal phase 07/18/2019   ? Multiple sclerosis (HCC) 06/14/2019   ? GERD (gastroesophageal reflux disease) 06/14/2019   ? HLD (hyperlipidemia) 06/14/2019   ? Menopause 06/14/2019   ? Pulmonary nodule 06/14/2019   ? Thyroid nodule 06/14/2019   ? Coronary artery disease involving native coronary artery of native heart without angina pectoris 06/14/2019     Overview Note:     05/03/19 and 08/07/19 CT Chest @ [facility] - moderate coronary artery calcifications seen  07/06/15 - echocardiogram @ [facility] - LVEF 50-55%, mild concentric LVH, trace tricuspid regurgitation     ? Macrocytosis 06/14/2019   ? Vitamin D deficiency 06/14/2019   ? Neuropathy 06/14/2019   ? Spinal stenosis of lumbar region without neurogenic claudication 06/14/2019   ? Other idiopathic scoliosis, thoracolumbar region 06/14/2019   ? Trauma 05/03/2019   ? Pubic ramus fracture (HCC) 05/03/2019   ? Pubic ramus fracture, right, sequela 05/03/2019   ? 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 Dislocation of tarsometatarsal joint of left foot 04/17/2018   ? Iron deficiency anemia 07/30/2017   ? B12 deficiency 07/30/2017   ? GI bleed 07/28/2017   ? 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 Breast cancer, left breast (HCC) 10/03/2015     Overview Note:     Added automatically from request for surgery [id number]     ? Anemia associated with chemotherapy 09/28/2015   ?  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 Chronic fatigue 09/28/2015   ? Encounter for education 07/13/2015   ? Anxiety 07/13/2015   ? 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 Dockum is a Caucasian female who presented to the [facility] on 06/23/2015 at age 59 for evaluation of left breast lump. Ms. Trom felt a lump in the middle of April 2017. She feels it is about the size of a quarter. She denies any nipple discharge, skin change or pain. She has not had imaging since 2015.  Ultrasound guided biopsy on 06/25/15 revealed grade 3 IDC.  Ms. Reuber completed neoadjuvant ddAC + Taxol on 11/30/15.  Ms. Ottum underwent left RSL lumpectomy/SLNB on 12/14/2015.  Final pathology revealed a complete pathologic response with 0/4 lymph nodes.  She completed radiation therapy with Dr. Janee Morn on 02/09/16.      BREAST IMAGING:  Mammogram:    -- Bilateral screening mammogram 01/02/14 [facility]) revealed scattered fibroglandular densities. There was no suspicious mass or architectural distortion.  -- Bilateral diagnostic mammogram 06/23/15 ([facility]) revealed scattered fibroglandular densities. There was a dense, irregular, 2 cm mass at the patient's area of palpable concern in the upper left breast, 12:00 position, anterior to middle depth. No new suspicious abnormality was seen within the right breast on mammogram.  Ultrasound:  -- Targeted left breast ultrasound 06/23/15 ([place]) revealed at 12:00, 5 cm from the nipple, demonstrated a 2.1 x 1.6 x 1.9 cm irregular, not parallel, hypoechoic mass with angular margins and internal blood flow. There may be intraductal extension from this mass towards the nipple. No suspicious left axillary or left parasternal lymph nodes were identified.  MRI:  -- Bilateral breast MRI 07/23/15 ([place]) revealed no area of suspicious enhancement in the right breast. No adenopathy, skin or nipple abnormalities were identified. Within the left breast superiorly at approximately 12:00 there was the known malignancy measuring 1.6 cm. 3 cm posterior and inferior to the mass within the middle depth central breast slightly medial, there was a 4 mm circumscribed enhancing mass which was stable on multiple prior mammograms dating back with June 2012. No skin or nipple abnormalities were present. No axillary abnormalities were present.    REPRODUCTIVE HEALTH:  Age at first Menarche:  80  Age at First Live Birth:  57  Age at Menopause:  47, took HRT x 2 years  Gravida:  2  Para: 2  Breastfeeding:  No    PROCEDURE:   1.  Right breast cyst excision, 1990s  2.  Left RSL lumpectomy/SLNB, 12/14/2015 [name])  PERTINENT PMH: Multiple sclerosis, HTN, HLD, GERD   FAMILY HISTORY:  No family history of breast cancer  PHYSICAL EXAM on PRESENTATION:  Left - 2.5 cm lump at 12:00, 5 cm FTN. No skin or nipple changes. Right - No palpable breast masses. No skin, nipple, or areolar change. No supraclavicular or axillary adenopathy.    MEDICAL ONCOLOGY: Dr. Stasia Cavalier NEOADJUVANT THERAPY:  Neoadjuvant ddAC + Taxol 07/20/15 - 11/23/15 PRESENT THERAPY: Observation  REFERRED BY:  Dr. Merian Capron    TREATMENT HISTORY:   Dose dense AC 522/17 - 08/31/15  Weekly Taxol 09/14/15            Other concerns addressed at this visit     Records requested  at the time of this visit:No    Prior consultations, labs, radiology reports reviewed at the time of this visit.Yes:      BMI:  Body mass index is 29.17 kg/m?Marland Kitchen  No data recorded  Wt Readings from Last 10 Encounters:   12/10/19 79.5 kg (175 lb 4.8 oz)   12/09/19 78.9 kg (174 lb)   11/01/19 80.9 kg (178 lb 6.4 oz)   10/22/19 81.2 kg (179 lb)   10/03/19 82.8 kg (182 lb 8.7 oz)   10/01/19 82.9 kg (182 lb 12.8 oz)   10/01/19 82.6 kg (182 lb)   09/30/19 81.2 kg (179 lb)   09/10/19 80.7 kg (178 lb)   08/21/19 80.7 kg (178 lb)          Review of Systems   Constitutional: Negative for fever and malaise/fatigue.   HENT: Negative for congestion.    Cardiovascular: Negative for chest pain.   Respiratory: Negative for shortness of breath.    Musculoskeletal: Positive for arthritis, back pain and falls.   Gastrointestinal: Negative for abdominal pain, hematochezia and melena.   Neurological: Positive for numbness (chronic neuropathy). Negative for dizziness.   Psychiatric/Behavioral: Negative for depression. The patient is not nervous/anxious.          Objective:         ? acetaminophen SR(+) (TYLENOL) 650 mg tablet Take 1,950 mg by mouth twice daily.   ? alendronate (FOSAMAX) 70 mg tablet Take one tablet by mouth every 7 days. Take at least 30 minutes before breakfast with plain water. Do not lie down for 30 minutes.   ? aspirin EC 81 mg tablet Take 81 mg by mouth daily. Take with food.   ? cholecalciferol (VITAMIN D-3) 1,000 units tablet Take 1,000 Units by mouth daily.   ? famotidine (PEPCID) 20 mg tablet Take one tablet by mouth twice daily. Indications: take this medication while taking naprosyn to avoid stomach bleeding   ? gabapentin (NEURONTIN) 300 mg capsule Take 3 caps twice a day.  Indications: neuropathic pain   ? pantoprazole DR (PROTONIX) 20 mg tablet Take one tablet by mouth daily.   ? rosuvastatin (CRESTOR) 10 mg tablet Take one tablet by mouth daily.   ? telmisartan (MICARDIS) 20 mg tablet Take one tablet by mouth daily.     Vitals:    12/10/19 0915   BP: 134/80   Pulse: 68   Resp: 14   Temp: 36.3 ?C (97.3 ?F)   TempSrc: Temporal   SpO2: 98%   Weight: 79.5 kg (175 lb 4.8 oz)   Height: 165.1 cm (65)           Body mass index is 29.17 kg/m?Marland Kitchen     Physical Exam  Constitutional:       General: She is not in acute distress.  HENT:      Right Ear: Tympanic membrane normal.      Left Ear: Tympanic membrane normal.      Mouth/Throat:      Mouth: Mucous membranes are moist.      Pharynx: Oropharynx is clear.   Eyes:      Conjunctiva/sclera: Conjunctivae normal.      Pupils: Pupils are equal, round, and reactive to light.   Cardiovascular:      Rate and Rhythm: Normal rate and regular rhythm.   Pulmonary:      Effort: Pulmonary effort is normal.      Breath sounds: Normal breath sounds.   Musculoskeletal:  Cervical back: No tenderness.      Right lower leg: No edema.      Left lower leg: No edema.   Lymphadenopathy:      Cervical: No cervical adenopathy.   Skin:     General: Skin is warm and dry.   Neurological:      General: No focal deficit present.      Mental Status: She is alert and oriented to person, place, and time.   Psychiatric:         Mood and Affect: Mood normal.              Assessment and Plan:    Rachael Black was seen today for physical.    Diagnoses and all orders for this visit:    Mixed hyperlipidemia  -     LIPID PROFILE; Future; Expected date: 12/10/2019  Continue a statin.  Coronary artery disease involving native coronary artery of native heart without angina pectoris  Continue with aspirin and statin  B12 deficiency  Continue vitamin B12 shots  Vitamin D deficiency  -     25-OH VITAMIN D (D2 + D3); Future; Expected date: 12/10/2019  Continue with vitamin D replacement  Gastroesophageal reflux disease, unspecified whether esophagitis present  Symptoms currently well controlled    Multiple sclerosis [facility])  Encourage patient to follow-up with neurology, however patient defers today.  Drug-induced polyneuropathy (HCC)  Symptoms currently well controlled on gabapentin.  Need for vaccination  -     FLU VACCINE =>65 YO HIGH DOSE QUAD (PF) (FLUZONE)  -     COVID-19 (PFIZER), MRNA, LNP-S, 30 MCG/0.3 ML (PF)    Primary hypertension  -     COMPREHENSIVE METABOLIC PANEL; Future; Expected date: 12/10/2019  Blood pressure currently well controlled today  Well adult exam  -     COMPREHENSIVE METABOLIC PANEL; Future; Expected date: 12/10/2019    Age-related osteoporosis without current pathological fracture  Continue Fosamax    No orders of the defined types were placed in this encounter.    There are no discontinued medications.    There are no Patient Instructions on file for this visit.  Visit Disposition     Dispositions    ? Return in about 1 year (around 12/09/2020) for Annual Wellness Visit.          Future Appointments   Date Time Provider Department Center   12/16/2019  2:00 PM [facility] None   12/16/2019  2:15 PM CH[facility]IRNO1 [facility] [place] [facility]   01/13/2020 10:00 AM [facility] None   01/13/2020 10:15 AM CHAIRNO3 [facility] [place] [facility]   02/10/2020 10:00 AM [facility] None   02/10/2020 10:15 AM CHAIRNO2 [facility] [place] [facility]   04/14/2020 10:30 AM SONO ? BH ROOM 2 [facility] Radiology   04/16/2020  9:30 AM Adonis Housekeeper, MD [facility] ENT   12/09/2020 10:00 AM Ilsa Iha, DO [facility]   12/14/2020 10:30 AM Briscoe Deutscher Elias Else None   12/14/2020 11:00 AM Laurence Slate, MD [facility] Exam     Return in about 1 year (around 12/09/2020) for Annual Wellness Visit.    I reviewed with the patient their current medications and specifically any new medications prescribed at the time of this visit and we reviewed the expected benefits and potential side effects. All questions are answered to the patient's satisfaction.    ? Health maintenance gaps were reviewed with the patient at the time of this visit.    ? I emphasized the importance  of medication adherence.   ? The medical problems/diagnoses listed under assessment and plan were addressed at this visit and unless otherwise stated are adequately controlled.        This note was in part completed with Dragon, a Chemical engineer.  Some grammatical errors may have occurred.  If you have concerns,please contact my office for clarification.                Problem   Primary Hypertension   Age-Related Osteoporosis Without Current Pathological Fracture

## 2019-12-16 ENCOUNTER — Encounter: Admit: 2019-12-16 | Discharge: 2019-12-16 | Payer: MEDICARE

## 2019-12-16 DIAGNOSIS — E538 Deficiency of other specified B group vitamins: Secondary | ICD-10-CM

## 2019-12-16 MED ORDER — CYANOCOBALAMIN (VITAMIN B-12) 1,000 MCG/ML IJ SOLN
1000 ug | Freq: Once | INTRAMUSCULAR | 0 refills | Status: CP
Start: 2019-12-16 — End: ?

## 2019-12-16 NOTE — Progress Notes
Pt here for Vitamin B12. Pt voices no complaints. Lab results reviewed and ok to treat. Vitamin B12 given IM in right deltoid. Pt tolerated well. Pt discharged to home in stable condition.

## 2019-12-16 NOTE — Patient Instructions
Hospital Outpatient Visit on 12/16/2019   Component Date Value Ref Range Status   ? White Blood Cells 12/16/2019 6.4  4.5 - 11.0 K/UL Final   ? RBC 12/16/2019 4.02  4.0 - 5.0 M/UL Final   ? Hemoglobin 12/16/2019 12.0  12.0 - 15.0 GM/DL Final   ? Hematocrit 81/19/1478 37.4  36 - 45 % Final   ? MCV 12/16/2019 93.0  80 - 100 FL Final   ? MCH 12/16/2019 29.8  26 - 34 PG Final   ? MCHC 12/16/2019 32.1  32.0 - 36.0 G/DL Final   ? RDW 29/56/2130 14.5  11 - 15 % Final   ? Platelet Count 12/16/2019 326  150 - 400 K/UL Final   ? MPV 12/16/2019 6.0* 7 - 11 FL Final

## 2020-01-13 ENCOUNTER — Encounter: Admit: 2020-01-13 | Discharge: 2020-01-13 | Payer: MEDICARE

## 2020-01-13 DIAGNOSIS — E538 Deficiency of other specified B group vitamins: Secondary | ICD-10-CM

## 2020-01-13 MED ORDER — CYANOCOBALAMIN (VITAMIN B-12) 1,000 MCG/ML IJ SOLN
1000 ug | Freq: Once | INTRAMUSCULAR | 0 refills | Status: CP
Start: 2020-01-13 — End: ?
  Administered 2020-01-13: 15:00:00 1000 ug via INTRAMUSCULAR

## 2020-01-13 NOTE — Progress Notes
Pt here for B12 injection. Given in right deltoid. Pt tolerated well and discharged home in stable condition.

## 2020-01-14 ENCOUNTER — Encounter: Admit: 2020-01-14 | Discharge: 2020-01-14 | Payer: MEDICARE

## 2020-01-14 DIAGNOSIS — Z8639 Personal history of other endocrine, nutritional and metabolic disease: Secondary | ICD-10-CM

## 2020-01-14 DIAGNOSIS — E538 Deficiency of other specified B group vitamins: Secondary | ICD-10-CM

## 2020-01-14 DIAGNOSIS — Z853 Personal history of malignant neoplasm of breast: Secondary | ICD-10-CM

## 2020-01-14 DIAGNOSIS — Z1231 Encounter for screening mammogram for malignant neoplasm of breast: Secondary | ICD-10-CM

## 2020-01-29 ENCOUNTER — Encounter: Admit: 2020-01-29 | Discharge: 2020-01-29 | Payer: MEDICARE

## 2020-02-10 ENCOUNTER — Encounter: Admit: 2020-02-10 | Discharge: 2020-02-10 | Payer: MEDICARE

## 2020-02-10 DIAGNOSIS — Z8639 Personal history of other endocrine, nutritional and metabolic disease: Secondary | ICD-10-CM

## 2020-02-10 DIAGNOSIS — E538 Deficiency of other specified B group vitamins: Secondary | ICD-10-CM

## 2020-02-10 LAB — IRON + BINDING CAPACITY + %SAT+ FERRITIN
Lab: 121 ug/dL (ref 50–160)
Lab: 535 ug/dL — ABNORMAL HIGH (ref 270–380)
Lab: 7 ng/mL — ABNORMAL LOW (ref 10–200)

## 2020-02-10 LAB — VITAMIN B12: Lab: 252 pg/mL — ABNORMAL LOW (ref 180–914)

## 2020-02-10 MED ORDER — CYANOCOBALAMIN (VITAMIN B-12) 1,000 MCG/ML IJ SOLN
1000 ug | Freq: Once | INTRAMUSCULAR | 0 refills | Status: CP
Start: 2020-02-10 — End: ?
  Administered 2020-02-10: 16:00:00 1000 ug via INTRAMUSCULAR

## 2020-02-10 NOTE — Progress Notes
Patient here today for Vitamin B12 injection. IM injection given right deltoid, patient tolerated well. To return as scheduled.

## 2020-02-10 NOTE — Patient Instructions
[facility]  Chemotherapy Instructions        Scheduled Medications:  Vitamin B12    Other:     As Needed Medications:      Other:     Call Immediately to report the following:  Uncontrolled nausea and/or vomiting, uncontrolled pain, or unusual bleeding.  Temperature of 100.5 F or greater and/or any sign/symptom of infection (redness, warmth, tenderness)  Painful mouth or difficulty swallowing  Red, cracked, or painful hands and/or feet  Diarrhea   Swelling of arms or legs  Rash  Other:    Important Phone Numbers:  Cancer Center Main Number (answered 24 hours a day) [phone]  Cancer Center Scheduling (appointments)[phone]  Social Worker Lynn Vanderweel [phone]  Nutritionist  [phone]  [facility] (for nutritional supplements) [phone]

## 2020-02-11 ENCOUNTER — Encounter: Admit: 2020-02-11 | Discharge: 2020-02-11 | Payer: MEDICARE

## 2020-02-11 ENCOUNTER — Ambulatory Visit: Admit: 2020-02-11 | Discharge: 2020-02-11 | Payer: MEDICARE

## 2020-02-11 DIAGNOSIS — E611 Iron deficiency: Secondary | ICD-10-CM

## 2020-02-11 DIAGNOSIS — Z8639 Personal history of other endocrine, nutritional and metabolic disease: Secondary | ICD-10-CM

## 2020-02-11 DIAGNOSIS — Z853 Personal history of malignant neoplasm of breast: Secondary | ICD-10-CM

## 2020-02-11 DIAGNOSIS — Z1231 Encounter for screening mammogram for malignant neoplasm of breast: Secondary | ICD-10-CM

## 2020-02-11 MED ORDER — FERROUS SULFATE 325 MG (65 MG IRON) PO TBEC
325 mg | ORAL_TABLET | Freq: Every day | ORAL | 3 refills | 30.00000 days | Status: AC
Start: 2020-02-11 — End: ?

## 2020-02-11 NOTE — Progress Notes
Called patient to review lab results. Per Dr Felicity Coyer, Iron deficiency noted on labs. With anemia and degree of iron deficiency we can proceed with IV iron infusion or trial with oral iron. Discussed this with patient--she wishes to proceed with oral iron at this time and will call back if the constipation gets to be too much and she wants to switch to IV. Script sent to pharmacy. Encouraged pt to call back with any further concern/questions.

## 2020-03-13 ENCOUNTER — Encounter: Admit: 2020-03-13 | Discharge: 2020-03-13 | Payer: MEDICARE

## 2020-03-13 DIAGNOSIS — E538 Deficiency of other specified B group vitamins: Secondary | ICD-10-CM

## 2020-03-13 MED ORDER — CYANOCOBALAMIN (VITAMIN B-12) 1,000 MCG/ML IJ SOLN
1000 ug | Freq: Once | INTRAMUSCULAR | 0 refills | Status: CP
Start: 2020-03-13 — End: ?
  Administered 2020-03-13: 19:00:00 1000 ug via INTRAMUSCULAR

## 2020-03-13 NOTE — Progress Notes
Patient here today for Vitamin B12 injection. IM injection given to right deltoid, patient tolerated well. To return as scheduled.

## 2020-04-13 ENCOUNTER — Encounter

## 2020-04-13 DIAGNOSIS — E538 Deficiency of other specified B group vitamins: Secondary | ICD-10-CM

## 2020-04-13 MED ORDER — CYANOCOBALAMIN (VITAMIN B-12) 1,000 MCG/ML IJ SOLN
1000 ug | Freq: Once | INTRAMUSCULAR | 0 refills | Status: CP
Start: 2020-04-13 — End: ?
  Administered 2020-04-13: 19:00:00 1000 ug via INTRAMUSCULAR

## 2020-04-13 NOTE — Progress Notes
Patient here today for Vitamin B12 injection. IM injection given to right deltoid, patient tolerated well. To return as scheduled.

## 2020-04-14 ENCOUNTER — Encounter

## 2020-04-14 DIAGNOSIS — I2510 Atherosclerotic heart disease of native coronary artery without angina pectoris: Secondary | ICD-10-CM

## 2020-04-14 DIAGNOSIS — R1314 Dysphagia, pharyngoesophageal phase: Secondary | ICD-10-CM

## 2020-04-21 ENCOUNTER — Encounter: Admit: 2020-04-21 | Discharge: 2020-04-21 | Payer: MEDICARE

## 2020-04-30 ENCOUNTER — Encounter: Admit: 2020-04-30 | Discharge: 2020-04-30 | Payer: MEDICARE

## 2020-04-30 ENCOUNTER — Ambulatory Visit: Admit: 2020-04-30 | Discharge: 2020-04-30 | Payer: MEDICARE

## 2020-04-30 DIAGNOSIS — C801 Malignant (primary) neoplasm, unspecified: Secondary | ICD-10-CM

## 2020-04-30 DIAGNOSIS — S62109A Fracture of unspecified carpal bone, unspecified wrist, initial encounter for closed fracture: Secondary | ICD-10-CM

## 2020-04-30 DIAGNOSIS — R1314 Dysphagia, pharyngoesophageal phase: Secondary | ICD-10-CM

## 2020-04-30 DIAGNOSIS — E041 Nontoxic single thyroid nodule: Secondary | ICD-10-CM

## 2020-04-30 DIAGNOSIS — K219 Gastro-esophageal reflux disease without esophagitis: Secondary | ICD-10-CM

## 2020-04-30 DIAGNOSIS — G629 Polyneuropathy, unspecified: Secondary | ICD-10-CM

## 2020-04-30 DIAGNOSIS — G35 Multiple sclerosis: Secondary | ICD-10-CM

## 2020-04-30 DIAGNOSIS — M1990 Unspecified osteoarthritis, unspecified site: Secondary | ICD-10-CM

## 2020-04-30 DIAGNOSIS — S92902A Unspecified fracture of left foot, initial encounter for closed fracture: Secondary | ICD-10-CM

## 2020-04-30 DIAGNOSIS — Z9221 Personal history of antineoplastic chemotherapy: Secondary | ICD-10-CM

## 2020-04-30 DIAGNOSIS — M549 Dorsalgia, unspecified: Secondary | ICD-10-CM

## 2020-04-30 DIAGNOSIS — E785 Hyperlipidemia, unspecified: Secondary | ICD-10-CM

## 2020-04-30 DIAGNOSIS — I10 Essential (primary) hypertension: Secondary | ICD-10-CM

## 2020-04-30 NOTE — Progress Notes
Date of Service: 04/30/2020    Subjective:             Rachael Black is a 75 y.o. female.    History of Present Illness  05/03/19 Fall and ED eval, CT detected Thyroid mass  Left FNA benign 06/2019  ?  Voice comes and goes, gradual change, stable since last visit. sAW kRAFT IN [place].  Has chew things up small and food getting caught in lower part of the next  Has to chase things down w water frequently  Saw Dr. Marisa Sprinkles and had a swallow study, which showed laryngeal penetration but no aspiration.  ?  She denies a history of neck/thyroid mass, breathing troubles, neck tightness/pain, rapid mass growth, external visibility. NO NEW MASSES    04/14/20 US Thyroid IMPROVED  ?  Rachael Black has received previous treatment and workup including:  TSH:         Lab Results   Component Value Date   ? TSH 1.50 05/05/2019   ?  [place] T:       06/25/19   US THYROID   ? Narrative   ? COMPARISON: Thyroid ultrasound 06/25/2019     FINDINGS:     Isthmus: Normal in thickness. No nodule identified.     Right lobe: Normal size, measures 4.7 x 1.7 x 1 cm. ?The right lobe   parenchyma is homogenous. No nodules identified.     Left lobe: Normal size, measures 3.1 x 2.5 x 2 cm. One nodule is measured.   The remainder of the left lobe is homogeneous.   * ?Mid-lower pole. Mixed cystic/solid, isoechoic, smooth margins,   macrocalcifications. Round shape. Measures 2.5 x 2.1 x 1.4 cm (previously   2.8 x 2.3 x 1.8 cm on 06/25/2019). TR3. This was intervally biopsied on   07/04/2019 with benign cytology.     No enlarged or morphologically suspicious anterior cervical lymph nodes   are seen.     IMPRESSION       Stable to slight decrease in size of a left TR 3 nodule since 06/25/2019.   This was intervally biopsied with benign cytology.       ACR TI-RADS assessment categories   TR1: Benign   TR2: Not suspicious   TR3: Mildly suspicious   TR4: Moderately suspicious   TR5: Highly suspicious [email or web address]       ?Finalized by Judie Petit, M.D. on 04/14/2020 2:22 PM. Dictated by Judie Petit, M.D. on 04/14/2020 1:56 PM.    ?    ?    ?  CTs: 05/03/19 CT CA:  Chest:     1. No acute thoracic fracture is identified. Multiple subacute to chronic   appearing bilateral rib fractures.   2. Multiple noncalcified bilateral pulmonary nodules measuring up to 5 mm.   These are indeterminate for benign etiology such as noncalcified   granulomas (given the presence of calcified granulomas) versus pulmonary   metastases in a patient with history of breast cancer. Benign etiology is   favored though a follow-up CT chest is recommended in 3 months to begin to   show longer-term stability in the absence of outside exam showing   long-term stability.   3. Large hiatal hernia with some adjacent mild to moderate compressive   atelectasis in the left lower lobe. Additional areas of mild bilateral   scarring and atelectasis in the bilateral lungs without definite pulmonary   contusion or focal pneumonia. Some areas of mild mosaic attenuation  bilaterally are likely related to mild small airways disease rather than   small vessel disease.   4. Moderate coronary artery calcifications.   5. Hypodense left thyroid lesions, the largest measuring up to 1.9 cm.   These are incompletely characterized and a nonemergent thyroid ultrasound   is recommended for better characterization, if not previously worked up.     Abdomen and Pelvis:     1. Comminuted and minimally displaced fracture of the right superior pubic   ramus and right pubis. Nondisplaced acute fracture of the right inferior   pubic ramus is also noted. There is mild associated extraperitoneal   hemorrhage, right greater than left, with mild enlargement of the right   obturator internus possibly related to muscular edema and/or mild   intramuscular hemorrhage. No acetabular fracture or diastases at the pubic symphysis or sacroiliac joints.   2. Mild subcutaneous contusion lateral to the right hip.   3. No major abdominal or pelvic visceral injury.   4. Moderate left convexity curvature lumbar spine multilevel degenerative   changes and mild chronic appearing loss of height at the L1 superior   endplate. No acute lumbar fracture.   5. Cholelithiasis without acute cholecystitis.   6. Mild bilateral renal atrophy.     FNA: 07/04/19  A. Thyroid, Left, US-FNA: ?   Benign   Consistent with a benign follicular nodule (adenomatoid nodule, colloid   nodule, nodular goiter)          Review of Systems   Constitutional: Negative.    HENT: Negative.    Eyes: Negative.    Respiratory: Positive for choking.    Cardiovascular: Negative.    Gastrointestinal: Negative.    Endocrine: Negative.    Genitourinary: Negative.    Musculoskeletal: Negative.    Skin: Negative.    Allergic/Immunologic: Negative.    Neurological: Negative.    Hematological: Negative.    Psychiatric/Behavioral: Negative.          Objective:         ? acetaminophen SR(+) (TYLENOL) 650 mg tablet Take 1,950 mg by mouth twice daily.   ? alendronate (FOSAMAX) 70 mg tablet Take one tablet by mouth every 7 days. Take at least 30 minutes before breakfast with plain water. Do not lie down for 30 minutes.   ? aspirin EC 81 mg tablet Take 81 mg by mouth daily. Take with food.   ? cholecalciferol (VITAMIN D-3) 1,000 units tablet Take 1,000 Units by mouth daily.   ? famotidine (PEPCID) 20 mg tablet Take one tablet by mouth twice daily. Indications: take this medication while taking naprosyn to avoid stomach bleeding   ? ferrous sulfate 325 mg (65 mg iron) tablet Take one tablet by mouth daily.   ? gabapentin (NEURONTIN) 300 mg capsule Take 3 caps twice a day.  Indications: neuropathic pain   ? pantoprazole DR (PROTONIX) 20 mg tablet Take one tablet by mouth daily.   ? rosuvastatin (CRESTOR) 10 mg tablet Take one tablet by mouth daily.   ? telmisartan (MICARDIS) 20 mg tablet Take one tablet by mouth daily.     Vitals:    04/30/20 0833   BP: (!) 157/90   Pulse: 87   Weight: 81.6 kg (180 lb)   Height: 165.1 cm (5' 5)   PainSc: Zero     Body mass index is 29.95 kg/m?Marland Kitchen     Physical Exam  Alert, NAD  Head: Normocephalic/atraumatic overweight WALKS W CANE  Ears: AU Auricles without lesions, EAC clear  AU, hearing grossly intact AU  Eyes: EOMs equal OU, PERRL, vision grossly intact OU, conjunctiva clear  Nose: Externally without lesions, septum midline, no visible lesions via anterior rhinoscopy  OC/OP: Teeth in good repair, tongue movement and sensation intact, no mass or mucosal lesions in OC or OP visibly or palpably, tonsil fossa symmetric  Larynx: Voice normal without stridor or sturgor, normal external landmarks  Neck: No neck mass or adenopathy, no palpable thyroid masses, no cutaneous changes  No fixation to skin and normal thyroid bed mobility  CNs: Facial sensation and movement intact bilaterally, shoulder shrug normal bilaterally     Assessment and Plan:  1. 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 We discussed the risks and merits of each Tx approach including serial exams, serial [place], FNA, Molecular testing, Thyroid Scan and surgery. Reviewed interval studies and notes. Hx 2017 breast EBRT. iNTERVAL US Thyroid IMPROVED.  ?  Rachael Black  wishes to proceed with serial exam.  ?  2. Dysphagia with food catching and small bites. Thyroid upper limit normal size. Does have mild MS. Saw Dr. Marisa Sprinkles and offered speech therapy, declined. HAS BEEN STABLE  ?  3. Lung nodules followed by PCP and serial scans set up    SERIAL US Thyroid IN 18 MONTHS  ?  If surg:  Biospecimen Repository  PAT clinic eval  Schedule  COVID Testing  Cardiology Clearance - mod calc on ct chest                    ATTESTATION    I have reviewed and agree with the chief complaint    I personally performed all or the key portions of the E/M visit, discussed the case with the resident and concur with the resident documentation of the history, physical exam, assessment, and treatment plan unless otherwise noted. I was either present or personally performed all the components of the visit. All documentation in CAPS (except Radiology Reports) represents my changes to the assessment, exam and recommendations for treatment. Documentation not in CAPS was provided by the resident.     I have personally reviewed and summarized pertinent labs and provided interpretation/summary above.   I have personally reviewed medical/radiology studies and provided interpretation/summary above.  I have personally reviewed the available interval notes and summarized pertinent information above.  My recommendations will be shared with other providers involved in the care of this patient by sending them a copy of my note.       Staff name:  Adonis Housekeeper, MD Date:  04/30/2020

## 2020-05-07 ENCOUNTER — Encounter: Admit: 2020-05-07 | Discharge: 2020-05-07 | Payer: MEDICARE

## 2020-05-07 DIAGNOSIS — R911 Solitary pulmonary nodule: Secondary | ICD-10-CM

## 2020-05-07 LAB — POC CREATININE, RAD: Lab: 0.9 mg/dL (ref 0.4–1.00)

## 2020-05-07 MED ORDER — IOHEXOL 350 MG IODINE/ML IV SOLN
70 mL | Freq: Once | INTRAVENOUS | 0 refills | Status: CP
Start: 2020-05-07 — End: ?
  Administered 2020-05-07: 18:00:00 70 mL via INTRAVENOUS

## 2020-05-07 MED ORDER — SODIUM CHLORIDE 0.9 % IJ SOLN
50 mL | Freq: Once | INTRAVENOUS | 0 refills | Status: CP
Start: 2020-05-07 — End: ?
  Administered 2020-05-07: 18:00:00 50 mL via INTRAVENOUS

## 2020-05-08 ENCOUNTER — Encounter: Admit: 2020-05-08 | Discharge: 2020-05-08 | Payer: MEDICARE

## 2020-05-11 ENCOUNTER — Encounter: Admit: 2020-05-11 | Discharge: 2020-05-11 | Payer: MEDICARE

## 2020-05-11 DIAGNOSIS — E538 Deficiency of other specified B group vitamins: Secondary | ICD-10-CM

## 2020-05-11 DIAGNOSIS — C50112 Malignant neoplasm of central portion of left female breast: Secondary | ICD-10-CM

## 2020-05-11 DIAGNOSIS — Z8639 Personal history of other endocrine, nutritional and metabolic disease: Secondary | ICD-10-CM

## 2020-05-11 LAB — VITAMIN B12: Lab: 302 pg/mL — ABNORMAL LOW (ref 180–914)

## 2020-05-11 MED ORDER — CYANOCOBALAMIN (VITAMIN B-12) 1,000 MCG/ML IJ SOLN
1000 ug | Freq: Once | INTRAMUSCULAR | 0 refills | Status: CP
Start: 2020-05-11 — End: ?
  Administered 2020-05-11: 18:00:00 1000 ug via INTRAMUSCULAR

## 2020-05-11 NOTE — Patient Instructions
.  [facility]  Chemotherapy Instructions    Rachael Black 05/11/2020    Scheduled Medications: B12    Call Immediately to report the following:  Unexplained bleeding or bleeding that will not stop  Difficulty swallowing  Shortness of breath, wheezing, or trouble breathing  Rapid, irregular heartbeat; chest pain  Dizziness, lightheadedness  Rash or cut that swells or turns red, feels hot or painful, or begin to ooze  Diarrhea   Uncontrolled nausea or vomiting  Fever of 100.4 F or higher, or chills    Important Phone Numbers:  [address] Number (answered 24 hours a day) [place] (appointments) [phone]  Social Worker [phone]    .For up-to-date information on COVID-19, visit the [facility] website. [email or web address]   General supportive care during cold and flu season and infection prevention reminders:   o Wash hands often with soap and water for at least 20 seconds or use hand sanitizer with at least 60% alcohol.   o Cover your mouth and nose.  o Practice social distancing: Try to maintain 6 feet between you and people outside your household.  o Avoid crowds and poorly ventilated spaces.  o Stay home if sick and symptoms are mild or manageable. If you must be around other people, wear a mask and practice social distancing.      If you are having symptoms of a lower respiratory infection (cough, shortness of breath) and/or fever or have been exposed to someone with COVID-19:    o Call your primary care provider for questions or health needs.   - Tell your doctor about your recent travel and your symptoms          In a medical emergency, call 911 or go to the nearest emergency room.

## 2020-05-11 NOTE — Progress Notes
Patient presents for B12 injection. Labs drawn by phlebotomist. Treatment parameters met. Injection tolerated well to right deltoid. Patient left ambulatory and at baseline condition.

## 2020-05-18 ENCOUNTER — Encounter: Admit: 2020-05-18 | Discharge: 2020-05-18 | Payer: MEDICARE

## 2020-05-18 NOTE — Telephone Encounter
Called patient to discuss lab results--left message on voicemail. Per Dr Felicity Coyer, B12 level is within normal limits. Iron levels have improved, and hemoglobin has normalized. Pt to continue oral iron supplementation. Follow up scheduled for October. Encouraged pt to call back with any further concern/questions.

## 2020-06-04 ENCOUNTER — Encounter: Admit: 2020-06-04 | Discharge: 2020-06-04 | Payer: MEDICARE

## 2020-06-05 ENCOUNTER — Encounter: Admit: 2020-06-05 | Discharge: 2020-06-05 | Payer: MEDICARE

## 2020-06-05 DIAGNOSIS — G629 Polyneuropathy, unspecified: Secondary | ICD-10-CM

## 2020-06-05 MED ORDER — GABAPENTIN 300 MG PO CAP
ORAL_CAPSULE | Freq: Two times a day (BID) | 3 refills | Status: AC
Start: 2020-06-05 — End: ?

## 2020-06-05 NOTE — Telephone Encounter
Physical: 12/10/19  Next apt: 12/09/20

## 2020-07-17 ENCOUNTER — Encounter: Admit: 2020-07-17 | Discharge: 2020-07-17 | Payer: MEDICARE

## 2020-07-17 DIAGNOSIS — K219 Gastro-esophageal reflux disease without esophagitis: Secondary | ICD-10-CM

## 2020-07-17 MED ORDER — PANTOPRAZOLE 20 MG PO TBEC
20 mg | ORAL_TABLET | Freq: Every day | ORAL | 0 refills | 30.00000 days | Status: AC
Start: 2020-07-17 — End: ?

## 2020-07-22 ENCOUNTER — Encounter: Admit: 2020-07-22 | Discharge: 2020-07-22 | Payer: MEDICARE

## 2020-07-22 MED ORDER — TELMISARTAN 20 MG PO TAB
20 mg | ORAL_TABLET | Freq: Every day | ORAL | 3 refills | 90.00000 days | Status: AC
Start: 2020-07-22 — End: ?

## 2020-08-18 ENCOUNTER — Encounter: Admit: 2020-08-18 | Discharge: 2020-08-18 | Payer: MEDICARE

## 2020-08-18 NOTE — Telephone Encounter
PLEASE FAX FOR CONTINUATION OF CARE:    -most recent labs including CBC, CMP/BMP, FLP, A1C, TSH, etc    Patient: Rachael Black. Melina Fiddler  DOB: 07/04/1945    Fax attn: Earnest Bailey RN with Dr. Frutoso Chase at the [place] [facility]    Fax: [phone]  Phone: [phone]    Thank you!

## 2020-08-21 ENCOUNTER — Encounter: Admit: 2020-08-21 | Discharge: 2020-08-21 | Payer: MEDICARE

## 2020-09-14 ENCOUNTER — Ambulatory Visit: Admit: 2020-09-14 | Discharge: 2020-09-14 | Payer: MEDICARE

## 2020-09-14 ENCOUNTER — Encounter: Admit: 2020-09-14 | Discharge: 2020-09-14 | Payer: MEDICARE

## 2020-09-14 DIAGNOSIS — Z0000 Encounter for general adult medical examination without abnormal findings: Secondary | ICD-10-CM

## 2020-09-14 DIAGNOSIS — E782 Mixed hyperlipidemia: Secondary | ICD-10-CM

## 2020-09-14 DIAGNOSIS — E559 Vitamin D deficiency, unspecified: Secondary | ICD-10-CM

## 2020-09-14 DIAGNOSIS — I10 Essential (primary) hypertension: Secondary | ICD-10-CM

## 2020-09-14 LAB — COMPREHENSIVE METABOLIC PANEL
ALBUMIN: 4.2 g/dL (ref 3.5–5.0)
ALK PHOSPHATASE: 56 U/L (ref 25–110)
ALT: 9 U/L (ref 7–56)
ANION GAP: 12 (ref 3–12)
AST: 13 U/L (ref 7–40)
BLD UREA NITROGEN: 10 mg/dL (ref 7–25)
CALCIUM: 10 mg/dL (ref 8.5–10.6)
CHLORIDE: 99 MMOL/L (ref 98–110)
CO2: 28 MMOL/L (ref 21–30)
CREATININE: 0.9 mg/dL (ref 0.4–1.00)
EGFR: >60 mL/min (ref >60)
GLUCOSE,PANEL: 79 mg/dL (ref 70–100)
POTASSIUM: 4.6 MMOL/L (ref <100)
SODIUM: 139 MMOL/L (ref >40)
TOTAL BILIRUBIN: 0.5 mg/dL (ref 0.3–1.2)
TOTAL PROTEIN: 7.1 g/dL (ref 6.0–8.0)

## 2020-09-14 LAB — LIPID PROFILE
CHOLESTEROL: 155 mg/dL (ref <200)
TRIGLYCERIDES: 124 mg/dL (ref <150)

## 2020-09-17 ENCOUNTER — Encounter: Admit: 2020-09-17 | Discharge: 2020-09-17 | Payer: MEDICARE

## 2020-09-18 ENCOUNTER — Encounter: Admit: 2020-09-18 | Discharge: 2020-09-18 | Payer: MEDICARE

## 2020-09-18 ENCOUNTER — Ambulatory Visit: Admit: 2020-09-18 | Discharge: 2020-09-18 | Payer: MEDICARE

## 2020-09-18 DIAGNOSIS — G35 Multiple sclerosis: Secondary | ICD-10-CM

## 2020-09-18 DIAGNOSIS — Z9221 Personal history of antineoplastic chemotherapy: Secondary | ICD-10-CM

## 2020-09-18 DIAGNOSIS — E785 Hyperlipidemia, unspecified: Secondary | ICD-10-CM

## 2020-09-18 DIAGNOSIS — I950 Idiopathic hypotension: Secondary | ICD-10-CM

## 2020-09-18 DIAGNOSIS — M549 Dorsalgia, unspecified: Secondary | ICD-10-CM

## 2020-09-18 DIAGNOSIS — M1990 Unspecified osteoarthritis, unspecified site: Secondary | ICD-10-CM

## 2020-09-18 DIAGNOSIS — R931 Abnormal findings on diagnostic imaging of heart and coronary circulation: Secondary | ICD-10-CM

## 2020-09-18 DIAGNOSIS — G629 Polyneuropathy, unspecified: Secondary | ICD-10-CM

## 2020-09-18 DIAGNOSIS — I10 Essential (primary) hypertension: Secondary | ICD-10-CM

## 2020-09-18 DIAGNOSIS — S62109A Fracture of unspecified carpal bone, unspecified wrist, initial encounter for closed fracture: Secondary | ICD-10-CM

## 2020-09-18 DIAGNOSIS — S92902A Unspecified fracture of left foot, initial encounter for closed fracture: Secondary | ICD-10-CM

## 2020-09-18 DIAGNOSIS — C801 Malignant (primary) neoplasm, unspecified: Secondary | ICD-10-CM

## 2020-09-18 DIAGNOSIS — K219 Gastro-esophageal reflux disease without esophagitis: Secondary | ICD-10-CM

## 2020-09-18 DIAGNOSIS — E782 Mixed hyperlipidemia: Secondary | ICD-10-CM

## 2020-09-18 NOTE — Patient Instructions
It was good to see you today!     Call the cardiology scheduling number at [phone] in 6 months to make a follow up appointment to see Dr. Frutoso Chase in July 2023.    Please contact the cardiology Red Team at [phone] or send a message through the [facility] system if you have questions or concerns. We will get back to you as soon as possible.     NOTE: MyChart messages and phone calls received on weekends, on holidays, and after 4 pm on weekdays will NOT be seen until the following business day. If you have an urgent matter during these times, please call [phone] to reach the on-call team.     You may receive test results in MyChart before the ordering provider has reviewed them. Our care team will follow up with you after reviewing the tests to discuss your care. This may take up to 5-7 business days if results are not urgently needing to be addressed. Thank you for your patience.      If you need prescription refills, please contact your pharmacy.

## 2020-12-07 ENCOUNTER — Encounter: Admit: 2020-12-07 | Discharge: 2020-12-07 | Payer: MEDICARE

## 2020-12-07 DIAGNOSIS — Z1231 Encounter for screening mammogram for malignant neoplasm of breast: Secondary | ICD-10-CM

## 2020-12-21 ENCOUNTER — Encounter: Admit: 2020-12-21 | Discharge: 2020-12-21 | Payer: MEDICARE

## 2020-12-21 DIAGNOSIS — G629 Polyneuropathy, unspecified: Secondary | ICD-10-CM

## 2020-12-21 DIAGNOSIS — G35 Multiple sclerosis: Secondary | ICD-10-CM

## 2020-12-21 DIAGNOSIS — M549 Dorsalgia, unspecified: Secondary | ICD-10-CM

## 2020-12-21 DIAGNOSIS — M1990 Unspecified osteoarthritis, unspecified site: Secondary | ICD-10-CM

## 2020-12-21 DIAGNOSIS — Z9221 Personal history of antineoplastic chemotherapy: Secondary | ICD-10-CM

## 2020-12-21 DIAGNOSIS — K219 Gastro-esophageal reflux disease without esophagitis: Secondary | ICD-10-CM

## 2020-12-21 DIAGNOSIS — C50112 Malignant neoplasm of central portion of left female breast: Secondary | ICD-10-CM

## 2020-12-21 DIAGNOSIS — S62109A Fracture of unspecified carpal bone, unspecified wrist, initial encounter for closed fracture: Secondary | ICD-10-CM

## 2020-12-21 DIAGNOSIS — I10 Essential (primary) hypertension: Secondary | ICD-10-CM

## 2020-12-21 DIAGNOSIS — S92902A Unspecified fracture of left foot, initial encounter for closed fracture: Secondary | ICD-10-CM

## 2020-12-21 DIAGNOSIS — E785 Hyperlipidemia, unspecified: Secondary | ICD-10-CM

## 2020-12-21 DIAGNOSIS — C801 Malignant (primary) neoplasm, unspecified: Secondary | ICD-10-CM

## 2020-12-21 NOTE — Progress Notes
Date of Service: 12/21/2020      Subjective:             Reason for Visit:  Heme/Onc Care      Rachael Black is a 75 y.o. female.      History of Present Illness  PRIMARY PROVIDER: Dr. Merian Capron  SURGEON: Dr. 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  She had her regular screening mammogram in November 2015. She palpated a mass in the left breast that led to diagnostic imaging march 2017. 2 cm mass was found on imaging studies at 12:00 position for which she underwent stereotactic biopsy.  Pathology was consistent with an invasive ductal carcinoma, triple negative, grade 3 with Ki-67 of 68 percent.  Denies pain, nipple discharge, skin changes. She had excisional biopsy on right outer lower quadrant for a cyst long time back.   Patient could not participate in clinical trial due to underlying history of multiple sclerosis.  She received neoadjuvant chemotherapy with dose dense AC and weekly Taxol.  Could not receive 12 cycles of  Taxol due to neuropathy.  She underwent lumpectomy and sentinel lymph node biopsy with Dr. [name] 12/14/2015, pathology showed pathological CR.  Lymph nodes were not involved.   Due to palpable mass/cyst, mammogram and [place] were done Feb 2018, cyst was found and aspirated.    Patient was admitted May 2019 due to anemia. ?Suspicion for GI bleed. ?heme occult positive. ?Colonoscopy showed rectal polyp, biopsies were negative for H. pylori and celiac disease.   No evidence of hemolysis.   Iron study showed severe iron deficiency. ?3 doses of IV Venofer, started on oral iron supplementation.   No plans for further endoscopy unless anemia does not improve per GI.   Also noted B12 deficiency. ?With level of 118. ?Started on IM B 12 injection.   Vit D low at 20.1    TREATMENT HISTORY:   Dose dense AC 07/20/15 - 08/31/15  Weekly Taxol 09/14/15 - 11/23/15.  12th cycle not given due to neuropathy  Completed radiation 02/09/2016    INTERVAL HISTORY: Patient is here for follow-up.  Accompanied for today's visit.  Continues to be on Fosamax for osteoporosis history.  Gait imbalance is overall stable.  Underlying multiple sclerosis is also overall stable.  Her husband has been diagnosed with MDS since the last visit with me.       Review of Systems   Constitutional: Negative for activity change, appetite change, chills, diaphoresis, fatigue and fever.   HENT: Negative.    Eyes: Negative.    Respiratory: Negative for cough and shortness of breath.    Cardiovascular: Negative for chest pain and palpitations.   Gastrointestinal: Negative for abdominal pain, blood in stool, constipation, diarrhea, nausea and vomiting.   Genitourinary: Negative.    Musculoskeletal: Negative for arthralgias.   Skin: Negative for rash.   Neurological: Positive for numbness. Negative for dizziness, weakness, light-headedness and headaches.   Hematological: Negative.    Psychiatric/Behavioral: Negative.        PAST MEDICAL HISTORY: Multiple sclerosis, GERD  FAMILY HISTORY: Reviewed and non contributory. No history of breast, ovarian, endometrial cancer  OB/GYN HISTORY:  Age at first Menarche:? 53  Age at Menopause:? 10, took HRT x 2 years  Age at First Live Birth:? 17  Gravida:? 2  Para: 2  Breastfeeding:? No  SOCIAL HISTORY: Non smoker. No alcohol or drug use    Objective:         ? acetaminophen SR(+) (TYLENOL) 650 mg tablet Take 1,950 mg by mouth twice daily.   ? alendronate (FOSAMAX) 70 mg tablet Take one tablet by mouth every 7 days. Take at least 30 minutes before breakfast with plain water. Do not lie down for 30 minutes.   ? aspirin EC 81 mg tablet Take 81 mg by mouth daily. Take with food.   ? cholecalciferol (VITAMIN D-3) 1,000 units tablet Take 1,000 Units by mouth daily.   ? famotidine (PEPCID) 20 mg tablet Take one tablet by mouth twice daily. Indications: take this medication while taking naprosyn to avoid stomach bleeding   ? ferrous sulfate 325 mg (65 mg iron) tablet Take one tablet by mouth daily.   ? gabapentin (NEURONTIN) 300 mg capsule Take 3 caps twice a day.  Indications: neuropathic pain   ? pantoprazole DR (PROTONIX) 20 mg tablet Take one tablet by mouth daily.   ? rosuvastatin (CRESTOR) 10 mg tablet Take one tablet by mouth daily.   ? telmisartan (MICARDIS) 20 mg tablet Take one tablet by mouth daily.     Vitals:    12/21/20 0832   BP: (!) 143/74   BP Source: Arm, Right Upper   Pulse: 77   Temp: 36.4 ?C (97.6 ?F)   Resp: 18   SpO2: 94%   TempSrc: Temporal   PainSc: Zero   Weight: 80.3 kg (177 lb)   Height: 163 cm (5' 4.17)     Body mass index is 30.22 kg/m?Marland Kitchen     Pain Score: Zero         Pain Addressed:  N/A    Patient Evaluated for a Clinical Trial: Patient currently in screening for a treatment clinical trial.     Eastern Cooperative Oncology Group performance status is 1, Restricted in physically strenuous activity but ambulatory and able to carry out work of a light or sedentary nature, e.g., light house work, office work.     Physical Exam  Constitutional:       General: She is not in acute distress.     Appearance: She is well-developed.   HENT:      Head: Normocephalic.      Mouth/Throat:      Pharynx: No oropharyngeal exudate.   Eyes:      Conjunctiva/sclera: Conjunctivae normal.      Comments: Left eye ptosis    Cardiovascular:      Rate and Rhythm: Normal rate and regular rhythm.      Comments:     Pulmonary:      Effort: Pulmonary effort is normal.      Breath sounds: Normal breath sounds.   Abdominal:      Palpations: There is no mass.      Tenderness: There is no abdominal tenderness.   Musculoskeletal:         General: Normal range of motion.      Cervical back: Neck supple.   Lymphadenopathy:      Cervical: No cervical adenopathy.   Skin:     Findings: No rash.   Neurological:      Mental Status: She is alert and oriented to person, place, and time.   Psychiatric:         Behavior: Behavior normal.         Thought Content: Thought content normal.  Judgment: Judgment normal.               Assessment and Plan:  1. Left breast IDC, triple negative. 2.5 cm on clinical exam, cT2cN0 stage IIA disease.  MRI showed 1.6 cm area, likely T1c disease.  2. Completed neoadjuvant chemotherapy with dd AC and weekly taxol.  Pathological CR seen at the time of surgery.  No long-term side effects from her chemotherapy.  3. Completed radiation with Dr. Janee Morn 02/09/16.  Scheduled for mammogram October 2020.  4.  Lymphedema of the breast,   No acute issues  Bilateral mammogram December 2021 [facility]RADS 2 benign [facility].  She will continue annual mammograms.  Has scheduled this year's imaging.  5.  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  Does not feel its been contributing to her falls or gait issues.  Has completed physical therapy in the past.    6.  Anemia.  No acute issues.  Most recent labs reveal normal hemoglobin.  She also has follow-up and labs with her primary care physician coming up in a few weeks.  7.  She has had B12 levels checked in the past due to macrocytosis and on lower end of normal.  Continue B12 oral supplementation.  Labs reviewed with her.  8.  Mild iron deficiency: On iron supplementation.  She will get labs through her PCP at upcoming appointment.  9.  Multiple sclerosis, long-standing history.     Patient is 5 years out from her triple negative breast cancer diagnosis.  I reviewed surveillance options, annual follow-ups through our survivorship clinic was also suggested.  Patient feels comfortable continuing her follow-ups through primary care provider and returning to our clinic as needed.  Return precautions discussed.  She will continue annual mammograms.  Encouraged  a heart healthy diet, regular exercise and an active lifestyle, taking calcium either via diet or supplementation,vitamin D, weightbearing exercises, and continuing routine follow-up visits and surveillance imaging.     Latest Reference Range & Units 03/13/20 12:38 04/13/20 12:44 05/07/20 11:34 05/11/20 12:45 09/14/20 11:07   Hemoglobin 12.0 - 15.0 GM/DL 16.1 (L) 09.6  04.5    Hematocrit 36 - 45 % 35.5 (L) 39.3  42.5    Platelet Count 150 - 400 K/UL 304 249  265    White Blood Cells 4.5 - 11.0 K/UL 4.8 6.1  5.7    RBC 4.0 - 5.0 M/UL 4.07 4.30  4.45    MCV 80 - 100 FL 87.1 91.3  95.6    MCH 26 - 34 PG 26.5 28.7  30.8    MCHC 32.0 - 36.0 G/DL 40.9 (L) 81.1 (L)  91.4    MPV 7 - 11 FL 6.0 (L) 6.1 (L)  6.4 (L)    RDW 11 - 15 % 17.9 (H) 19.9 (H)  19.9 (H)    Vitamin B12 180 - 914 PG/ML    302    Iron 50 - 160 MCG/DL    45 (L)    % Saturation 28 - 42 %    9 (L)    Iron Binding-TIBC 270 - 380 MCG/DL    782 (H)    Ferritin 10 - 200 NG/ML    39    Sodium 137 - 147 MMOL/L     139   Potassium 3.5 - 5.1 MMOL/L     4.6   Chloride 98 - 110 MMOL/L     99   CO2 21 - 30 MMOL/L     28   Anion  Gap 3 - 12      12   Blood Urea Nitrogen 7 - 25 MG/DL     10   Creatinine 0.4 - 1.00 MG/DL     1.61   Creatinine, POC 0.4 - 1.00 MG/DL   0.9     eGFR >09 mL/min     >60   Glucose 70 - 100 MG/DL     79   Albumin 3.5 - 5.0 G/DL     4.2   Calcium 8.5 - 10.6 MG/DL     60.4   Total Bilirubin 0.3 - 1.2 MG/DL     0.5   (L): Data is abnormally low  (H): Data is abnormally high    Parts of this note were created with voice recognition software. Please excuse any grammatical or typographical errors.

## 2021-01-26 ENCOUNTER — Ambulatory Visit: Admit: 2021-01-26 | Discharge: 2021-01-26 | Payer: MEDICARE

## 2021-01-26 ENCOUNTER — Encounter: Admit: 2021-01-26 | Discharge: 2021-01-26 | Payer: MEDICARE

## 2021-01-26 DIAGNOSIS — K219 Gastro-esophageal reflux disease without esophagitis: Secondary | ICD-10-CM

## 2021-01-26 DIAGNOSIS — M549 Dorsalgia, unspecified: Secondary | ICD-10-CM

## 2021-01-26 DIAGNOSIS — I10 Essential (primary) hypertension: Secondary | ICD-10-CM

## 2021-01-26 DIAGNOSIS — Z9221 Personal history of antineoplastic chemotherapy: Secondary | ICD-10-CM

## 2021-01-26 DIAGNOSIS — G629 Polyneuropathy, unspecified: Secondary | ICD-10-CM

## 2021-01-26 DIAGNOSIS — R911 Solitary pulmonary nodule: Secondary | ICD-10-CM

## 2021-01-26 DIAGNOSIS — E538 Deficiency of other specified B group vitamins: Secondary | ICD-10-CM

## 2021-01-26 DIAGNOSIS — Z0000 Encounter for general adult medical examination without abnormal findings: Secondary | ICD-10-CM

## 2021-01-26 DIAGNOSIS — E782 Mixed hyperlipidemia: Secondary | ICD-10-CM

## 2021-01-26 DIAGNOSIS — S62109A Fracture of unspecified carpal bone, unspecified wrist, initial encounter for closed fracture: Secondary | ICD-10-CM

## 2021-01-26 DIAGNOSIS — I2510 Atherosclerotic heart disease of native coronary artery without angina pectoris: Secondary | ICD-10-CM

## 2021-01-26 DIAGNOSIS — M48061 Spinal stenosis, lumbar region without neurogenic claudication: Secondary | ICD-10-CM

## 2021-01-26 DIAGNOSIS — I700 Atherosclerosis of aorta: Secondary | ICD-10-CM

## 2021-01-26 DIAGNOSIS — Z1211 Encounter for screening for malignant neoplasm of colon: Secondary | ICD-10-CM

## 2021-01-26 DIAGNOSIS — G35 Multiple sclerosis: Secondary | ICD-10-CM

## 2021-01-26 DIAGNOSIS — M4125 Other idiopathic scoliosis, thoracolumbar region: Secondary | ICD-10-CM

## 2021-01-26 DIAGNOSIS — Z853 Personal history of malignant neoplasm of breast: Secondary | ICD-10-CM

## 2021-01-26 DIAGNOSIS — D508 Other iron deficiency anemias: Secondary | ICD-10-CM

## 2021-01-26 DIAGNOSIS — E559 Vitamin D deficiency, unspecified: Secondary | ICD-10-CM

## 2021-01-26 DIAGNOSIS — M810 Age-related osteoporosis without current pathological fracture: Secondary | ICD-10-CM

## 2021-01-26 DIAGNOSIS — C801 Malignant (primary) neoplasm, unspecified: Secondary | ICD-10-CM

## 2021-01-26 DIAGNOSIS — G620 Drug-induced polyneuropathy: Secondary | ICD-10-CM

## 2021-01-26 DIAGNOSIS — S92902A Unspecified fracture of left foot, initial encounter for closed fracture: Secondary | ICD-10-CM

## 2021-01-26 DIAGNOSIS — M1990 Unspecified osteoarthritis, unspecified site: Secondary | ICD-10-CM

## 2021-01-26 DIAGNOSIS — E785 Hyperlipidemia, unspecified: Secondary | ICD-10-CM

## 2021-01-26 DIAGNOSIS — E041 Nontoxic single thyroid nodule: Secondary | ICD-10-CM

## 2021-01-26 LAB — CBC AND DIFF
EOSINOPHILS %: 2 % (ref 0–5)
LYMPHOCYTES %: 26 % (ref 24–44)
MCHC: 33 g/dL (ref 32.0–36.0)
MONOCYTES %: 12 % (ref 4–12)
MPV: 7 FL (ref 7–11)
NEUTROPHILS %: 60 % (ref 41–77)
PLATELET COUNT: 218 K/UL (ref 150–400)
RBC COUNT: 3.8 M/UL — ABNORMAL LOW (ref 4.0–5.0)
RDW: 14 % (ref 11–15)
WBC COUNT: 6.1 K/UL (ref 4.5–11.0)

## 2021-01-26 LAB — VITAMIN B12: VITAMIN B12: 158 pg/mL — ABNORMAL HIGH (ref 180–914)

## 2021-01-26 LAB — IRON + BINDING CAPACITY + %SAT+ FERRITIN
FERRITIN: 27 ng/mL — ABNORMAL HIGH (ref 10–200)
IRON BINDING: 416 ug/dL — ABNORMAL HIGH (ref 270–380)
IRON: 208 ug/dL — ABNORMAL HIGH (ref 50–160)

## 2021-01-26 MED ORDER — PANTOPRAZOLE 20 MG PO TBEC
20 mg | ORAL_TABLET | Freq: Every day | ORAL | 3 refills | 90.00000 days | Status: AC
Start: 2021-01-26 — End: ?

## 2021-01-26 MED ORDER — ALENDRONATE 70 MG PO TAB
70 mg | ORAL_TABLET | ORAL | 3 refills | 84.00000 days | Status: AC
Start: 2021-01-26 — End: ?

## 2021-01-26 MED ORDER — ROSUVASTATIN 10 MG PO TAB
10 mg | ORAL_TABLET | Freq: Every day | ORAL | 3 refills | 90.00000 days | Status: AC
Start: 2021-01-26 — End: ?

## 2021-01-26 NOTE — Progress Notes
Faxed PT order to [facility] in [place] per patient's request. Fax [phone]. Received fax confirmation.

## 2021-01-26 NOTE — Patient Instructions
Health Maintenance   Topic Date Due    HEPATITIS C SCREENING  Never done    SHINGLES RECOMBINANT VACCINE (2 of 3) 01/19/2015    ADVANCED CARE PLANNING DISCUSSION AND DOCUMENTATION  Never done    PHYSICAL (COMPREHENSIVE) EXAM  12/09/2020    BREAST CANCER SCREENING  02/10/2021    MEDICARE ANNUAL WELLNESS VISIT  01/26/2022    DTAP/TDAP VACCINES (2 - Td or Tdap) 12/04/2023    COLORECTAL CANCER SCREENING  07/20/2026    OSTEOPOROSIS SCREENING/MONITORING  Completed    COVID-19 VACCINE  Completed    INFLUENZA VACCINE  Completed    PNEUMOCOCCAL VACCINE  Completed    DEPRESSION SCREENING  Completed

## 2021-01-27 ENCOUNTER — Encounter: Admit: 2021-01-27 | Discharge: 2021-01-27 | Payer: MEDICARE

## 2021-01-27 DIAGNOSIS — R718 Other abnormality of red blood cells: Secondary | ICD-10-CM

## 2021-01-27 DIAGNOSIS — E041 Nontoxic single thyroid nodule: Secondary | ICD-10-CM

## 2021-01-28 ENCOUNTER — Encounter: Admit: 2021-01-28 | Discharge: 2021-01-28 | Payer: MEDICARE

## 2021-01-28 ENCOUNTER — Ambulatory Visit: Admit: 2021-01-28 | Discharge: 2021-01-28 | Payer: MEDICARE

## 2021-01-28 DIAGNOSIS — Z1231 Encounter for screening mammogram for malignant neoplasm of breast: Secondary | ICD-10-CM

## 2021-02-03 ENCOUNTER — Encounter: Admit: 2021-02-03 | Discharge: 2021-02-03 | Payer: MEDICARE

## 2021-02-03 NOTE — Telephone Encounter
Pre Visit Planning- New Patient    Patient notified of upcoming appointment.    Patient active in [facility]. MyChart message sent with appointment reminders and instructions. Marland Kitchen             Updated chart: Medication, History and Allergies    Orders have been NA    New patient paperwork complete: No

## 2021-02-03 NOTE — Progress Notes
Faxed signed POC by Dr. Maudie Mercury to [facility], at [phone]. Received fax confirmation.

## 2021-02-04 ENCOUNTER — Encounter: Admit: 2021-02-04 | Discharge: 2021-02-04 | Payer: MEDICARE

## 2021-02-04 ENCOUNTER — Ambulatory Visit: Admit: 2021-02-04 | Discharge: 2021-02-04 | Payer: MEDICARE

## 2021-02-04 DIAGNOSIS — G629 Polyneuropathy, unspecified: Secondary | ICD-10-CM

## 2021-02-04 DIAGNOSIS — G35 Multiple sclerosis: Secondary | ICD-10-CM

## 2021-02-04 DIAGNOSIS — C801 Malignant (primary) neoplasm, unspecified: Secondary | ICD-10-CM

## 2021-02-04 DIAGNOSIS — Z9221 Personal history of antineoplastic chemotherapy: Secondary | ICD-10-CM

## 2021-02-04 DIAGNOSIS — K219 Gastro-esophageal reflux disease without esophagitis: Secondary | ICD-10-CM

## 2021-02-04 DIAGNOSIS — S62109A Fracture of unspecified carpal bone, unspecified wrist, initial encounter for closed fracture: Secondary | ICD-10-CM

## 2021-02-04 DIAGNOSIS — M48062 Spinal stenosis, lumbar region with neurogenic claudication: Secondary | ICD-10-CM

## 2021-02-04 DIAGNOSIS — S92902A Unspecified fracture of left foot, initial encounter for closed fracture: Secondary | ICD-10-CM

## 2021-02-04 DIAGNOSIS — E785 Hyperlipidemia, unspecified: Secondary | ICD-10-CM

## 2021-02-04 DIAGNOSIS — M549 Dorsalgia, unspecified: Secondary | ICD-10-CM

## 2021-02-04 DIAGNOSIS — I10 Essential (primary) hypertension: Secondary | ICD-10-CM

## 2021-02-04 DIAGNOSIS — M1990 Unspecified osteoarthritis, unspecified site: Secondary | ICD-10-CM

## 2021-02-04 NOTE — Progress Notes
[facility] HISTORY AND PHYSICAL    Chief Complaint:   Chief Complaint   Patient presents with   ? New Patient     lbp       Subjective     HISTORY OF PRESENT ILLNESS:   Rachael Black is a 75 y.o. female who  has a past medical history of Arthritis, Back pain, Cancer (HCC) (06/2015), Foot fracture, left (09/26/2016), GERD (gastroesophageal reflux disease), History of chemotherapy, HLD (hyperlipidemia), Hypertension, Multiple sclerosis (HCC) (1990), Neuropathy, and Wrist fracture (09/26/2016). who presents for evaluation.Patient is presenting with a longstanding history of low back pain.  She reports she has been dealing with this for greater than 5 years.  She reports pain starts in her low back and radiates to the right and left side of her low back, more left than right.  She reports the pain is constant nature, dull achy.  She denies any pain going down into legs feet or toes.  She does have neuropathy below her knees bilaterally.  She reports the pain is worse with standing and walking, bending and twisting.  She is only able to walk shorter distances and then she has to sit down to rest.  When she sits down the pain is relieved.  She reports the pain is also relieved by sitting uncomfortable chairs and being inactive.  She is done physical therapy in the past with only mild relief.  She is currently involved in formal physical therapy and does not think it is very effective.  She is on gabapentin 600 mg 3 times daily which does not seem to be helping.  She takes Tylenol arthritis.  She is unable to take NSAIDs due to GI bleed.  She is not had any spine intervention spine surgery in the past.             Rachael Black denies any recent fevers, chills, infection, antibiotics, bowel or bladder incontinence, saddle anesthesia, bleeding issues, or recent anticoagulant.     ROS:   A 10 point review of systems is negative except for what is noted in the HPI above.    Past Medical History:  Medical History:   Diagnosis Date   ? Arthritis    ? Back pain    ? Cancer (HCC) 06/2015    Breast    ? Foot fracture, left 09/26/2016   ? GERD (gastroesophageal reflux disease)    ? History of chemotherapy     neoadjuvant chemotherapy 2017   ? HLD (hyperlipidemia)    ? Hypertension    ? Multiple sclerosis (HCC) 1990   ? Neuropathy    ? Wrist fracture 09/26/2016    heads of ulna and radius       Family History:  Family History   Problem Relation Age of Onset   ? Arthritis-osteo Mother    ? Thyroid Disease Mother    ? Heart Disease Father    ? High Cholesterol Father    ? Cancer Maternal Aunt    ? Cancer Maternal Uncle    ? Cancer Paternal Uncle    ? Cancer-Colon Maternal Grandmother 56   ? Stroke Maternal Grandmother    ? Cancer-Colon Paternal Grandmother        Social History:  Lives in [place] [address]    Social History     Socioeconomic History   ? Marital status: Married   Tobacco Use   ? Smoking status: Former     Packs/day: 1.00  Years: 10.00     Pack years: 10.00     Types: Cigarettes     Quit date: 02/28/2005     Years since quitting: 15.9   ? Smokeless tobacco: Never   Vaping Use   ? Vaping Use: Never used   Substance and Sexual Activity   ? Alcohol use: Yes     Alcohol/week: 8.0 standard drinks     Types: 8 Standard drinks or equivalent per week   ? Drug use: No   Social History Narrative    Patient lives with husband. 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        Allergies:  Allergies   Allergen Reactions   ? Sulfa (Sulfonamide Antibiotics) HIVES and SHORTNESS OF BREATH       Medications:    Current Outpatient Medications:   ?  acetaminophen SR(+) (TYLENOL) 650 mg tablet, Take 1,950 mg by mouth twice daily., Disp: , Rfl:   ?  alendronate (FOSAMAX) 70 mg tablet, Take one tablet by mouth every 7 days. Take at least 30 minutes before breakfast with plain water. Do not lie down for 30 minutes., Disp: 12 tablet, Rfl: 3  ?  aspirin EC 81 mg tablet, Take 81 mg by mouth daily. Take with food., Disp: , Rfl:   ?  cholecalciferol (VITAMIN D-3) 1,000 units tablet, Take 1,000 Units by mouth daily., Disp: , Rfl:   ?  ferrous sulfate 325 mg (65 mg iron) tablet, Take one tablet by mouth daily., Disp: 30 tablet, Rfl: 3  ?  gabapentin (NEURONTIN) 300 mg capsule, Take 3 caps twice a day.  Indications: neuropathic pain, Disp: 540 capsule, Rfl: 3  ?  pantoprazole DR (PROTONIX) 20 mg tablet, Take one tablet by mouth daily., Disp: 90 tablet, Rfl: 3  ?  rosuvastatin (CRESTOR) 10 mg tablet, Take one tablet by mouth daily., Disp: 90 tablet, Rfl: 3  ?  telmisartan (MICARDIS) 20 mg tablet, Take one tablet by mouth daily., Disp: 90 tablet, Rfl: 3    Physical examination:   LMP  (LMP Unknown)   Pain Score: Three    Gen: Alert & Oriented X 3  HEENT: EOMI  Neck: Supple, no elevated JVP  Heart: Extremities well perfused  Lungs: non labored breathing  Abdomen: Soft, non-tender, non-distended  Skin: no gross lesions appreciated  Ext: purposeful movement of extremities     LOWER EXTREMITIES  MS:   Root Right Left   Hip Flexion L2 5 5   Knee Flexion L5/S1 5 5   Knee Extension L3 5 5   Dorsiflexion L4 5 5   Plantarflexion S1 5 5   EHL Extension L5 4 4     Gait was smooth and symmetric with equal arm swing.        Pain reproduced with facet loading.    No tenderness to palpation along the spinous process, facet joints,   Tender palpation bilateral paraspinal musculature, SI joints, gluteal musculature, greater trochanters.      Patient is able to forward flex to knees, and able to extend without significant pain.    Full ROM bilateral lower extremities.      Negative slump testing.  Negative straight leg testing to 30-75 degrees.      Negative FADIR testing.  Negative Patrick-FABER testing.  Negative Fortin Finger Sign.   Negative Gaenslens testing.  Negative SI compression    Neuro:  DTR's 2+ right patella, 2+ left patella  2+ right achilles, 2+ left achilles  Lower Extremity Tone Normal   Lower Extremity Sensation Intact to light touch bilaterally     DIAGNOSTICS:    L SPINE AP & LATERAL    Narrative  Exam: L SPINE AP & LATERAL    CLINICAL INDICATION: 75 years spinal stenosis and lumbar region.    COMPARISON: None.    Impression  1.  Severe levoscoliosis of the lumbar spine centered at L2-L3.    2.  Multilevel degenerative endplate change most pronounced at L2-L3 and L3-L4. Multilevel facet arthropathy most pronounced at the lower lumbar spine.    3.  Osseous demineralization. Vertebral body heights are limited in evaluation due to scoliosis. There appears to be mild superior endplate compression of the L1 which is likely chronic in nature. No definite acute osseous abnormality.    4.  Chronic fracture deformity of the right pubic bone.        Last Cr and LFT's:  Creatinine   Date Value Ref Range Status   09/14/2020 0.94 0.4 - 1.00 MG/DL Final     AST (SGOT)   Date Value Ref Range Status   09/14/2020 13 7 - 40 U/L Final     ALT (SGPT)   Date Value Ref Range Status   09/14/2020 9 7 - 56 U/L Final     Alk Phosphatase   Date Value Ref Range Status   09/14/2020 56 25 - 110 U/L Final     Total Bilirubin   Date Value Ref Range Status   09/14/2020 0.5 0.3 - 1.2 MG/DL Final            Assessment:  The pain complaints are most likely due to:  1. Lumbar stenosis with neurogenic claudication      Rachael Black is a 75 y.o. female who  has a past medical history of Arthritis, Back pain, Cancer (HCC) (06/2015), Foot fracture, left (09/26/2016), GERD (gastroesophageal reflux disease), History of chemotherapy, HLD (hyperlipidemia), Hypertension, Multiple sclerosis (HCC) (1990), Neuropathy, and Wrist fracture (09/26/2016). who presents for evaluation of pain.    Plan:  1.  Lifestyle modification.  Continue current activities as tolerated.    2.  Medication.    - No new medication at this time, okay with continuing gabapentin.  3.  Therapy.    - Patient provided prescription for formalized physical therapy to work on lumbar stabilization exercises, soft tissue mobilization, stretching techniques, as well as modalities such as TENS unit, traction, therapeutic ultrasound, and Grasston technique.  4.  Interventions.  No interventions planned at this time.  5.  Diagnostics.   MRI L-spine as she has failed conservative measures including medication and formal physical therapy.  6.  Follow-up.  Patient will follow-up after MRI to discuss interventional options, can consider lumbar epidural steroid injection.    Risks/benefits of all pharmacologic and interventional treatments discussed and questions answered.     Thank you for this kind referral for consultation. Please feel free to contact me with any questions or concerns.

## 2021-03-16 ENCOUNTER — Encounter: Admit: 2021-03-16 | Discharge: 2021-03-16 | Payer: MEDICARE

## 2021-03-16 ENCOUNTER — Ambulatory Visit: Admit: 2021-03-16 | Discharge: 2021-03-16 | Payer: MEDICARE

## 2021-03-16 DIAGNOSIS — Z9221 Personal history of antineoplastic chemotherapy: Secondary | ICD-10-CM

## 2021-03-16 DIAGNOSIS — G629 Polyneuropathy, unspecified: Secondary | ICD-10-CM

## 2021-03-16 DIAGNOSIS — G35 Multiple sclerosis: Secondary | ICD-10-CM

## 2021-03-16 DIAGNOSIS — M1990 Unspecified osteoarthritis, unspecified site: Secondary | ICD-10-CM

## 2021-03-16 DIAGNOSIS — C801 Malignant (primary) neoplasm, unspecified: Secondary | ICD-10-CM

## 2021-03-16 DIAGNOSIS — M549 Dorsalgia, unspecified: Secondary | ICD-10-CM

## 2021-03-16 DIAGNOSIS — S92902A Unspecified fracture of left foot, initial encounter for closed fracture: Secondary | ICD-10-CM

## 2021-03-16 DIAGNOSIS — E785 Hyperlipidemia, unspecified: Secondary | ICD-10-CM

## 2021-03-16 DIAGNOSIS — I10 Essential (primary) hypertension: Secondary | ICD-10-CM

## 2021-03-16 DIAGNOSIS — S82832A Other fracture of upper and lower end of left fibula, initial encounter for closed fracture: Secondary | ICD-10-CM

## 2021-03-16 DIAGNOSIS — S62109A Fracture of unspecified carpal bone, unspecified wrist, initial encounter for closed fracture: Secondary | ICD-10-CM

## 2021-03-16 DIAGNOSIS — K219 Gastro-esophageal reflux disease without esophagitis: Secondary | ICD-10-CM

## 2021-03-16 DIAGNOSIS — S8992XA Unspecified injury of left lower leg, initial encounter: Secondary | ICD-10-CM

## 2021-03-16 NOTE — Patient Instructions
Call [phone] to schedule your appointment with the orthopedist.  Try not to bear weight on your left leg.  Continue Tylenol and gabapentin for pain.

## 2021-03-16 NOTE — Progress Notes
Date of Service: 03/16/2021    Rachael Black is a 76 y.o. female.  DOB: 04-Jul-1945  MRN: [id number]     Subjective:             History of Present Illness  Chief Complaint   Patient presents with   ? Leg Injury     Pt states fell 03/10/21; ankle hurt for 2 days, now resolved but pain is in left lower outside of leg since the day after fall. Using walker to ambularte   Patient fell and sprained her left ankle 6 days ago.  Her left ankle is swollen.  It is feeling better, but she is still having pain in lateral lower left leg, just below her knee.  She said it hurts when she pushes on it.  If she takes a step the wrong way, it hurts a lot.  She is ambulating with a walker today which she does not usually do.       Review of Systems   Musculoskeletal: Positive for arthralgias, gait problem and joint swelling.         Objective:         ? acetaminophen SR(+) (TYLENOL) 650 mg tablet Take 1,950 mg by mouth twice daily.   ? alendronate (FOSAMAX) 70 mg tablet Take one tablet by mouth every 7 days. Take at least 30 minutes before breakfast with plain water. Do not lie down for 30 minutes.   ? aspirin EC 81 mg tablet Take 81 mg by mouth daily. Take with food.   ? cholecalciferol (VITAMIN D-3) 1,000 units tablet Take 1,000 Units by mouth daily.   ? ferrous sulfate 325 mg (65 mg iron) tablet Take one tablet by mouth daily.   ? gabapentin (NEURONTIN) 300 mg capsule Take 3 caps twice a day.  Indications: neuropathic pain   ? pantoprazole DR (PROTONIX) 20 mg tablet Take one tablet by mouth daily.   ? rosuvastatin (CRESTOR) 10 mg tablet Take one tablet by mouth daily.   ? telmisartan (MICARDIS) 20 mg tablet Take one tablet by mouth daily.     Vitals:    03/16/21 1412   BP: 116/70   Pulse: 80   Temp: 36.5 ?C (97.7 ?F)   Resp: 18   SpO2: 100%   TempSrc: Oral   Weight: 80.3 kg (177 lb)   Height: 162.6 cm (5' 4)     Body mass index is 30.38 kg/m?Marland Kitchen     Physical Exam  Cardiovascular:      Rate and Rhythm: Normal rate and regular rhythm.      Heart sounds: Normal heart sounds.   Pulmonary:      Effort: Pulmonary effort is normal.      Breath sounds: Normal breath sounds and air entry.   Musculoskeletal:        Legs:       Comments: Left lateral malleolus is somewhat swollen.  It is slightly TTT.  Left toes are warm to touch with good sensation.  NO TTT on left toes, left metatarsals, lateral or medial edge of left foot, or over Achille's tendon.  TTT just distal to left knee on lateral edge of lower left leg.  Slight erythema over anterior left lower leg.  No pain or erythema or swelling in left calf.                Assessment and Plan:  I noted a fibular fracture on posteior left fibula just distal to left knee that is  displaced.  I see an avulsion there also.  Rachael Black was seen today for leg injury.    Diagnoses and all orders for this visit:    Other closed fracture of proximal end of left fibula, initial encounter  -     AMB REFERRAL TO ORTHOPEDIC SURGERY/ SPORTS MEDICINE    Injury of left lower leg, initial encounter  -     TIBIA & FIBULA 2 VIEWS LEFT; Future; Expected date: 03/16/2021              I tried to put her in a CAM boot, but we are limited in our selection of sizes, and the boot hit here right where her fracture is and it was very uncomfortable.  She has had many foot surgeries in the past.  She reports she has a hot pink electric wheel chair at home that she can use.             Patient Instructions   Call [phone] to schedule your appointment with the orthopedist.  Try not to bear weight on your left leg.  Continue Tylenol and gabapentin for pain.

## 2021-03-17 ENCOUNTER — Encounter: Admit: 2021-03-17 | Discharge: 2021-03-17 | Payer: MEDICARE

## 2021-03-17 ENCOUNTER — Ambulatory Visit: Admit: 2021-03-17 | Discharge: 2021-03-17 | Payer: MEDICARE

## 2021-03-17 DIAGNOSIS — M1990 Unspecified osteoarthritis, unspecified site: Secondary | ICD-10-CM

## 2021-03-17 DIAGNOSIS — G629 Polyneuropathy, unspecified: Secondary | ICD-10-CM

## 2021-03-17 DIAGNOSIS — Z9221 Personal history of antineoplastic chemotherapy: Secondary | ICD-10-CM

## 2021-03-17 DIAGNOSIS — C801 Malignant (primary) neoplasm, unspecified: Secondary | ICD-10-CM

## 2021-03-17 DIAGNOSIS — M25572 Pain in left ankle and joints of left foot: Secondary | ICD-10-CM

## 2021-03-17 DIAGNOSIS — K219 Gastro-esophageal reflux disease without esophagitis: Secondary | ICD-10-CM

## 2021-03-17 DIAGNOSIS — S92902A Unspecified fracture of left foot, initial encounter for closed fracture: Secondary | ICD-10-CM

## 2021-03-17 DIAGNOSIS — S82832A Other fracture of upper and lower end of left fibula, initial encounter for closed fracture: Secondary | ICD-10-CM

## 2021-03-17 DIAGNOSIS — I10 Essential (primary) hypertension: Secondary | ICD-10-CM

## 2021-03-17 DIAGNOSIS — M549 Dorsalgia, unspecified: Secondary | ICD-10-CM

## 2021-03-17 DIAGNOSIS — E785 Hyperlipidemia, unspecified: Secondary | ICD-10-CM

## 2021-03-17 DIAGNOSIS — S62109A Fracture of unspecified carpal bone, unspecified wrist, initial encounter for closed fracture: Secondary | ICD-10-CM

## 2021-03-17 DIAGNOSIS — G35 Multiple sclerosis: Secondary | ICD-10-CM

## 2021-03-17 NOTE — Progress Notes
[facility] Note    Chief Complaint   Patient presents with   ? Left Ankle - New Patient     ?  ?HISTORY OF PRESENT ILLNESS:  Rachael Black is a 76 y.o. female who presents to our clinic today for the first time for an evaluation of her left proximal fibula fracture.  Patient fell approximately 1 week ago and sustained a twisting injury to her left lower leg.  She was seen in urgent care yesterday and diagnosed with a proximal fibula fracture.Reports pain at 7/10 associated with movement or twisting of the knee. Assistive Devices: Walker?.  She denies ankle pain at this time.  She does note that she has some peripheral neuropathy which decreases her knowledge of that.  She recently did have a midfoot and hindfoot procedure with Dr. Essie Christine.    ?PAST MEDICAL HISTORY:   Medical History:   Diagnosis Date   ? Arthritis    ? Back pain    ? Cancer (HCC) 06/2015    Breast    ? Foot fracture, left 09/26/2016   ? GERD (gastroesophageal reflux disease)    ? History of chemotherapy     neoadjuvant chemotherapy 2017   ? HLD (hyperlipidemia)    ? Hypertension    ? Multiple sclerosis (HCC) 1990   ? Neuropathy    ? Wrist fracture 09/26/2016    heads of ulna and radius       MEDICATIONS:     Current Outpatient Medications:   ?  acetaminophen SR(+) (TYLENOL) 650 mg tablet, Take 1,950 mg by mouth twice daily., Disp: , Rfl:   ?  alendronate (FOSAMAX) 70 mg tablet, Take one tablet by mouth every 7 days. Take at least 30 minutes before breakfast with plain water. Do not lie down for 30 minutes., Disp: 12 tablet, Rfl: 3  ?  aspirin EC 81 mg tablet, Take 81 mg by mouth daily. Take with food., Disp: , Rfl:   ?  cholecalciferol (VITAMIN D-3) 1,000 units tablet, Take 1,000 Units by mouth daily., Disp: , Rfl:   ?  ferrous sulfate 325 mg (65 mg iron) tablet, Take one tablet by mouth daily., Disp: 30 tablet, Rfl: 3  ?  gabapentin (NEURONTIN) 300 mg capsule, Take 3 caps twice a day.  Indications: neuropathic pain, Disp: 540 capsule, Rfl: 3  ?  pantoprazole DR (PROTONIX) 20 mg tablet, Take one tablet by mouth daily., Disp: 90 tablet, Rfl: 3  ?  rosuvastatin (CRESTOR) 10 mg tablet, Take one tablet by mouth daily., Disp: 90 tablet, Rfl: 3  ?  telmisartan (MICARDIS) 20 mg tablet, Take one tablet by mouth daily., Disp: 90 tablet, Rfl: 3    ALLERGIES:   Sulfa (sulfonamide antibiotics)      SOCIAL HISTORY:   Social History     Socioeconomic History   ? Marital status: Married   Tobacco Use   ? Smoking status: Former     Packs/day: 1.00     Years: 10.00     Pack years: 10.00     Types: Cigarettes     Quit date: 02/28/2005     Years since quitting: 16.0   ? Smokeless tobacco: Never   Vaping Use   ? Vaping Use: Never used   Substance and Sexual Activity   ? Alcohol use: Yes     Alcohol/week: 8.0 standard drinks     Types: 8 Standard drinks or equivalent per week   ? Drug use: No  Social History Narrative    Patient lives with husband. 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        FAMILY HISTORY: Notable for   Family History   Problem Relation Age of Onset   ? Arthritis-osteo Mother    ? Thyroid Disease Mother    ? Heart Disease Father    ? High Cholesterol Father    ? Cancer Maternal Aunt    ? Cancer Maternal Uncle    ? Cancer Paternal Uncle    ? Cancer-Colon Maternal Grandmother 52   ? Stroke Maternal Grandmother    ? Cancer-Colon Paternal Grandmother          REVIEW OF SYSTEMS:   10 point review of systems negative except as noted in HPI.     PCP: Ilsa Iha   ?  ?PHYSICAL EXAMINATION:   Vitals    Patient is alert and oriented and in no acute distress.   Patient displays an appropriate mood and affect during the examination.   Breathing is unlabored.   No lymphadenopathy appreciated.  On focused examination of her left lower extremity, skin appears intact. Ankle appears grossly stable to stress testing. Ankle ROM testing demonstrates 5 degrees DF and 30 degrees PF. Strength testing 5 out of 5 in all planes. Shows mild tenderness of lateral ankle ligament and deltoid ligament.  She is exquisitely tender palpation over her proximal fibula.  Neurovascularly, patient appears to have decreased sensation to light touch grossly to all nerve distributions of the left lower extremity. Foot is warm and well perfused. There is a palpable dorsalis pedis and posterior tibial pulse noted with capillary refill intact to all toes.   Hindfoot alignment appears mild valgus    ?  ?IMAGING: Three-view weight-bearing radiographs of the left anklewere obtained and reviewed today in clinic. Ankle mortise appears grossly intact and symmetric.  No fibular shortening noted.  No widening of her syndesmosis.  No acute fractures identified.  She does have evidence of prior extensive midfoot fusion and calcaneal osteotomy.  Tib-fib radiographs taken yesterday were available for review and these demonstrate a proximal fibula fracture.  ?  ?IMPRESSION: 76 year old female with proximal fibula fracture and concern for Maisonneuve injury  ?  ?PLAN: The natural history of the patient?s diagnosis, her prognosis and her reasonable options moving forward were discussed today in clinic.     Conservative: Both conservative and operative management was discussed today with the patient. At this time, we recommend continuing conservative management with the use of a walker and continued Tylenol use.  I explained that this looks like it is a stable injury at this point but with neuropathy it can be somewhat unpredictable.  I would like to see her back in 2 weeks with repeat radiographs to ensure she does not need surgical procedure stabilization of her ankle.    Follow Up: Patient will follow up in 2week(s). All questions were answered today. Patient was encouraged to call our office should she have any further concerns, questions or experience any new or worrisome symptoms.     Imaging at follow up: Weightbearing radiographs left ankle and tib-fib

## 2021-03-19 ENCOUNTER — Encounter: Admit: 2021-03-19 | Discharge: 2021-03-19 | Payer: MEDICARE

## 2021-03-19 ENCOUNTER — Ambulatory Visit: Admit: 2021-03-19 | Discharge: 2021-03-19 | Payer: MEDICARE

## 2021-03-19 DIAGNOSIS — M48062 Spinal stenosis, lumbar region with neurogenic claudication: Secondary | ICD-10-CM

## 2021-03-22 ENCOUNTER — Encounter: Admit: 2021-03-22 | Discharge: 2021-03-22 | Payer: MEDICARE

## 2021-03-22 ENCOUNTER — Ambulatory Visit: Admit: 2021-03-22 | Discharge: 2021-03-22 | Payer: MEDICARE

## 2021-03-22 DIAGNOSIS — C801 Malignant (primary) neoplasm, unspecified: Secondary | ICD-10-CM

## 2021-03-22 DIAGNOSIS — M5416 Radiculopathy, lumbar region: Secondary | ICD-10-CM

## 2021-03-22 DIAGNOSIS — I10 Essential (primary) hypertension: Secondary | ICD-10-CM

## 2021-03-22 DIAGNOSIS — K219 Gastro-esophageal reflux disease without esophagitis: Secondary | ICD-10-CM

## 2021-03-22 DIAGNOSIS — M48061 Spinal stenosis, lumbar region without neurogenic claudication: Secondary | ICD-10-CM

## 2021-03-22 DIAGNOSIS — G35 Multiple sclerosis: Secondary | ICD-10-CM

## 2021-03-22 DIAGNOSIS — S62109A Fracture of unspecified carpal bone, unspecified wrist, initial encounter for closed fracture: Secondary | ICD-10-CM

## 2021-03-22 DIAGNOSIS — Z9221 Personal history of antineoplastic chemotherapy: Secondary | ICD-10-CM

## 2021-03-22 DIAGNOSIS — G629 Polyneuropathy, unspecified: Secondary | ICD-10-CM

## 2021-03-22 DIAGNOSIS — M1990 Unspecified osteoarthritis, unspecified site: Secondary | ICD-10-CM

## 2021-03-22 DIAGNOSIS — M549 Dorsalgia, unspecified: Secondary | ICD-10-CM

## 2021-03-22 DIAGNOSIS — E785 Hyperlipidemia, unspecified: Secondary | ICD-10-CM

## 2021-03-22 DIAGNOSIS — M48062 Spinal stenosis, lumbar region with neurogenic claudication: Secondary | ICD-10-CM

## 2021-03-22 DIAGNOSIS — S92902A Unspecified fracture of left foot, initial encounter for closed fracture: Secondary | ICD-10-CM

## 2021-03-22 NOTE — Progress Notes
[facility] CLINIC NOTE       SUBJECTIVE: 76 year old female presenting to follow-up from last visit on 02/04/2021.  At that time she was diagnosed with lumbar stenosis with neurogenic claudication, potentially a radiculopathy.  She was not getting any improvement with conservative measures so we ordered an MRI of her L-spine.  That was done on 03/19/2021 and showed multiple levels of spondylosis with a left convex curve.  It showed pretty significant neuroforaminal stenosis at L4-5 and moderate bilateral neuroforaminal stenosis at L3-4.  She is reporting pain in her back going into her bilateral buttocks and somewhat down into her thighs but never past the knees.  Unfortunately she did recently have a fall and experienced a left fibular fracture which they are treating conservatively.  She is interested in next step of therapy.         Review of Systems    Current Outpatient Medications:   ?  acetaminophen SR(+) (TYLENOL) 650 mg tablet, Take 1,950 mg by mouth twice daily., Disp: , Rfl:   ?  alendronate (FOSAMAX) 70 mg tablet, Take one tablet by mouth every 7 days. Take at least 30 minutes before breakfast with plain water. Do not lie down for 30 minutes., Disp: 12 tablet, Rfl: 3  ?  aspirin EC 81 mg tablet, Take 81 mg by mouth daily. Take with food., Disp: , Rfl:   ?  cholecalciferol (VITAMIN D-3) 1,000 units tablet, Take 1,000 Units by mouth daily., Disp: , Rfl:   ?  ferrous sulfate 325 mg (65 mg iron) tablet, Take one tablet by mouth daily., Disp: 30 tablet, Rfl: 3  ?  gabapentin (NEURONTIN) 300 mg capsule, Take 3 caps twice a day.  Indications: neuropathic pain, Disp: 540 capsule, Rfl: 3  ?  pantoprazole DR (PROTONIX) 20 mg tablet, Take one tablet by mouth daily., Disp: 90 tablet, Rfl: 3  ?  rosuvastatin (CRESTOR) 10 mg tablet, Take one tablet by mouth daily., Disp: 90 tablet, Rfl: 3  ?  telmisartan (MICARDIS) 20 mg tablet, Take one tablet by mouth daily., Disp: 90 tablet, Rfl: 3  Allergies   Allergen Reactions ? Sulfa (Sulfonamide Antibiotics) HIVES and SHORTNESS OF BREATH     Physical Exam  Vitals:    03/22/21 1127   BP: (!) 144/88   BP Source: Arm, Left Upper   Pulse: 79   SpO2: 94%   PainSc: One     Oswestry Total Score:: 36  Pain Score: One  There is no height or weight on file to calculate BMI.  Physical Exam ?  Gen: comfortable, NAD ?  HEENT: NCAT, anicteric sclera ?  Card: Extremities warm, well-perfused, cap refill <2sec ?  Pulm: no distress, not cyanotic ?  Abd: soft, non-distended ?  Skin: Skin is warm and dry.  Psychiatric: normal mood and affect. Behavior is normal.     Neuro ?  CNII-XII grossly normal ?  Mental status appropriate     MSK: ?  Inspection: grossly symmetric, no obvious deformity, no erythema ??             IMPRESSION:  1. Neuroforaminal stenosis of lumbar spine    2. Lumbar radiculopathy    3. Lumbar stenosis with neurogenic claudication          PLAN: Scheduling a bilateral L3-4 transforaminal epidural steroid injection.

## 2021-03-26 ENCOUNTER — Ambulatory Visit: Admit: 2021-03-26 | Discharge: 2021-03-26 | Payer: MEDICARE

## 2021-03-26 DIAGNOSIS — Z1211 Encounter for screening for malignant neoplasm of colon: Secondary | ICD-10-CM

## 2021-03-29 ENCOUNTER — Encounter: Admit: 2021-03-29 | Discharge: 2021-03-29 | Payer: MEDICARE

## 2021-03-29 DIAGNOSIS — S82832A Other fracture of upper and lower end of left fibula, initial encounter for closed fracture: Secondary | ICD-10-CM

## 2021-03-29 DIAGNOSIS — M25572 Pain in left ankle and joints of left foot: Secondary | ICD-10-CM

## 2021-03-31 ENCOUNTER — Encounter: Admit: 2021-03-31 | Discharge: 2021-03-31 | Payer: MEDICARE

## 2021-03-31 ENCOUNTER — Ambulatory Visit: Admit: 2021-03-31 | Discharge: 2021-03-31 | Payer: MEDICARE

## 2021-03-31 DIAGNOSIS — Z9221 Personal history of antineoplastic chemotherapy: Secondary | ICD-10-CM

## 2021-03-31 DIAGNOSIS — G35 Multiple sclerosis: Secondary | ICD-10-CM

## 2021-03-31 DIAGNOSIS — M25572 Pain in left ankle and joints of left foot: Secondary | ICD-10-CM

## 2021-03-31 DIAGNOSIS — E785 Hyperlipidemia, unspecified: Secondary | ICD-10-CM

## 2021-03-31 DIAGNOSIS — S82832A Other fracture of upper and lower end of left fibula, initial encounter for closed fracture: Secondary | ICD-10-CM

## 2021-03-31 DIAGNOSIS — K219 Gastro-esophageal reflux disease without esophagitis: Secondary | ICD-10-CM

## 2021-03-31 DIAGNOSIS — S92902A Unspecified fracture of left foot, initial encounter for closed fracture: Secondary | ICD-10-CM

## 2021-03-31 DIAGNOSIS — M1990 Unspecified osteoarthritis, unspecified site: Secondary | ICD-10-CM

## 2021-03-31 DIAGNOSIS — I10 Essential (primary) hypertension: Secondary | ICD-10-CM

## 2021-03-31 DIAGNOSIS — S62109A Fracture of unspecified carpal bone, unspecified wrist, initial encounter for closed fracture: Secondary | ICD-10-CM

## 2021-03-31 DIAGNOSIS — G629 Polyneuropathy, unspecified: Secondary | ICD-10-CM

## 2021-03-31 DIAGNOSIS — C801 Malignant (primary) neoplasm, unspecified: Secondary | ICD-10-CM

## 2021-03-31 DIAGNOSIS — M549 Dorsalgia, unspecified: Secondary | ICD-10-CM

## 2021-03-31 NOTE — Progress Notes
DIAGNOSIS: No diagnosis found.    ?CHIEF COMPLAINT:   Chief Complaint   Patient presents with   ? Left Ankle - Follow Up     ?  ?INTERVAL HISTORY: [name] is a 76 y.o. female  who returns to our clinic in followup of her left proximal fibula fracture concern for Maisonneuve ankle injury. Rachael Black was last evaluated on 03/17/2021. On today?s visit, she  reports 2/10 pain, localized to proximal fibula.  She is now in a regular shoe with a cane instead of her cam boot and a walker  ?  ?PAST MEDICAL HISTORY/REVIEW OF SYSTEMS: Reviewed and otherwise negative. Please see patient intake form.  ?  ?PHYSICAL EXAMINATION:   There were no vitals filed for this visit.    Patient is alert and oriented and in no acute distress.   Patient displays an appropriate mood and affect during the examination.   Breathing is unlabored.  On focused examination of the Left lower extremity, skin appears intact.     On standing exam patient has hindfoot valgus.   She has no pain about the medial lateral aspect of ankle on standing    On seated exam, ankle ROM testing demonstrates10 degrees DF and 30 degrees PF. Subtalar range of motion 15 degrees.   Strength testing:  Tibialis anterior 5/5  Tibialis posterior: 4/5  Gastroc/Soleus: 5/5  Peroneus brevis  4/5  Neurovascularly, patient appears to have intact sensation to light touch grossly to all nerve distributions of the Left lower extremity. Foot is warm and well perfused. There is a palpable dorsalis pedis and posterior tibial pulse noted with capillary refill intact to all toes.     Patient ambulates with a Non-antalgic gait.     ?IMAGING: Three-view left ankle and tib-fib were obtained today in clinic.  This shows evidence of prior flatfoot reconstruction.  Ankle mortise appears well-maintained.  No obvious widening of the medial clear space or syndesmosis.  She has stable alignment of her proximal fibula fracture.    IMPRESSION: 76 year old female with left proximal fibula fracture and stable ankle.  ?   PLAN: The patient?s diagnosis, prognosis and reasonable options at this point moving forward were discussed again today in clinic.   -I explained her that this does appear to be a stable injury and we will try to avoid any sort of surgical management for this.  -I have no restrictions for her.  She is activities as tolerated.    Follow up in 6 weeks.    Imaging at follow up: Three-view left ankle, tib-fib weightbearing    All questions were answered today. Patient was encouraged to call our office should she have any further concerns, questions or experience any new or worrisome symptoms.

## 2021-04-20 ENCOUNTER — Encounter: Admit: 2021-04-20 | Discharge: 2021-04-20 | Payer: MEDICARE

## 2021-04-21 ENCOUNTER — Encounter: Admit: 2021-04-21 | Discharge: 2021-04-21 | Payer: MEDICARE

## 2021-04-21 ENCOUNTER — Ambulatory Visit: Admit: 2021-04-21 | Discharge: 2021-04-21 | Payer: MEDICARE

## 2021-04-21 DIAGNOSIS — Z9221 Personal history of antineoplastic chemotherapy: Secondary | ICD-10-CM

## 2021-04-21 DIAGNOSIS — S62109A Fracture of unspecified carpal bone, unspecified wrist, initial encounter for closed fracture: Secondary | ICD-10-CM

## 2021-04-21 DIAGNOSIS — G35 Multiple sclerosis: Secondary | ICD-10-CM

## 2021-04-21 DIAGNOSIS — K219 Gastro-esophageal reflux disease without esophagitis: Secondary | ICD-10-CM

## 2021-04-21 DIAGNOSIS — M48062 Spinal stenosis, lumbar region with neurogenic claudication: Secondary | ICD-10-CM

## 2021-04-21 DIAGNOSIS — S92902A Unspecified fracture of left foot, initial encounter for closed fracture: Secondary | ICD-10-CM

## 2021-04-21 DIAGNOSIS — M48061 Spinal stenosis, lumbar region without neurogenic claudication: Secondary | ICD-10-CM

## 2021-04-21 DIAGNOSIS — I10 Essential (primary) hypertension: Secondary | ICD-10-CM

## 2021-04-21 DIAGNOSIS — M1990 Unspecified osteoarthritis, unspecified site: Secondary | ICD-10-CM

## 2021-04-21 DIAGNOSIS — E785 Hyperlipidemia, unspecified: Secondary | ICD-10-CM

## 2021-04-21 DIAGNOSIS — M5416 Radiculopathy, lumbar region: Secondary | ICD-10-CM

## 2021-04-21 DIAGNOSIS — C801 Malignant (primary) neoplasm, unspecified: Secondary | ICD-10-CM

## 2021-04-21 DIAGNOSIS — G629 Polyneuropathy, unspecified: Secondary | ICD-10-CM

## 2021-04-21 DIAGNOSIS — M549 Dorsalgia, unspecified: Secondary | ICD-10-CM

## 2021-04-21 MED ORDER — DEXAMETHASONE SODIUM PHOS (PF) 10 MG/ML IJ EPIDURAL SOLN
10 mg | Freq: Once | EPIDURAL | 0 refills | Status: CP
Start: 2021-04-21 — End: ?
  Administered 2021-04-21: 23:00:00 10 mg via EPIDURAL

## 2021-04-21 MED ORDER — IOHEXOL 240 MG IODINE/ML IV SOLN
1 mL | Freq: Once | EPIDURAL | 0 refills | Status: AC
Start: 2021-04-21 — End: ?

## 2021-04-21 NOTE — Patient Instructions
Procedure Completed Today: Lumbar Transforaminal Steroid Injection    Important information following your procedure today: You may drive today    Pain relief may not be immediate. It is possible you may even experience an increase in pain during the first 24-48 hours followed by a gradual decrease of your pain.  Though the procedure is generally safe and complications are rare, we do ask that you be aware of any of the following:   Any swelling, persistent redness, new bleeding, or drainage from the site of the injection.  You should not experience a severe headache.  You should not run a fever over 101? F.  New onset of sharp, severe back & or neck pain.  New onset of upper or lower extremity numbness or weakness.  New difficulty controlling bowel or bladder function after the injection.  New shortness of breath.    If any of these occur, please call to report this occurrence to a nurse at [phone]. If you are calling after 4:00 p.m., on weekends or holidays please call [phone] and ask to have the resident physician on call for the physician paged or go to your local emergency room.  You may experience soreness at the injection site. Ice can be applied at 20 minute intervals. Avoid application of direct heat, hot showers or hot tubs today.  Avoid strenuous activity today. You may resume your regular activities and exercise tomorrow.  Patients with diabetes may see an elevation in blood sugars for 7-10 days after the injection. It is important to pay close attention to your diet, check your blood sugars daily and report extreme elevations to the physician that treats your diabetes.  Patients taking a daily blood thinner can resume their regular dose this evening.  It is important that you take all medications ordered by your pain physician. Taking medication as ordered is an important part of your pain care plan. If you cannot continue the medication plan, please notify the physician.     Possible side effects to steroids that may occur:  Flushing or redness of the face  Irritability  Fluid retention  Change in women?s menses    The following medications were used: Lidocaine , Triamcinolone  , and Contrast Dye

## 2021-04-28 ENCOUNTER — Encounter: Admit: 2021-04-28 | Discharge: 2021-04-28 | Payer: MEDICARE

## 2021-04-28 DIAGNOSIS — R911 Solitary pulmonary nodule: Secondary | ICD-10-CM

## 2021-04-30 ENCOUNTER — Encounter: Admit: 2021-04-30 | Discharge: 2021-04-30 | Payer: MEDICARE

## 2021-04-30 NOTE — Telephone Encounter
-----   Message from Julious Oka, APRN-NP sent at 04/30/2021  3:27 PM CST -----  Threasa Beards,  Can you call [name] and let her know that her CT to follow-up on her lung nodules looks good and no further imaging needs to be done of her lungs. She does have evidence of emphysema, which is likely related to previous smoking. She also did have a large hiatal hernia. Can you ask her if she has an GI symptoms, such as reflux, loss of appetite, bloating, belching, etc.? If so, may consider general surgery referral, if needed. If feeling well, no referral is needed.  Lilia Pro, APRN

## 2021-04-30 NOTE — Telephone Encounter
LVM for patient to return call to clinic.

## 2021-04-30 NOTE — Telephone Encounter
This RN notified patient of CT chest results and recommendations from Norman Herrlich APRN-NP. Patient voiced understanding and states she has bad GERD and takes pantoprazole for it. Says when she does not take it she coughs sometimes and was told this could be due to the GERD. Reports she has noticed she had been belching lately more. Denies loss of appetite. This RN mentioned we may consider a general surgery referral in which patient states "I'm not ready to do that" but will think about it.

## 2021-05-14 ENCOUNTER — Encounter: Admit: 2021-05-14 | Discharge: 2021-05-14 | Payer: MEDICARE

## 2021-05-25 ENCOUNTER — Encounter: Admit: 2021-05-25 | Discharge: 2021-05-25 | Payer: MEDICARE

## 2021-05-25 DIAGNOSIS — K449 Diaphragmatic hernia without obstruction or gangrene: Secondary | ICD-10-CM

## 2021-06-04 ENCOUNTER — Encounter: Admit: 2021-06-04 | Discharge: 2021-06-04 | Payer: MEDICARE

## 2021-06-04 ENCOUNTER — Ambulatory Visit: Admit: 2021-06-04 | Discharge: 2021-06-04 | Payer: MEDICARE

## 2021-06-04 DIAGNOSIS — C801 Malignant (primary) neoplasm, unspecified: Secondary | ICD-10-CM

## 2021-06-04 DIAGNOSIS — E785 Hyperlipidemia, unspecified: Secondary | ICD-10-CM

## 2021-06-04 DIAGNOSIS — M549 Dorsalgia, unspecified: Secondary | ICD-10-CM

## 2021-06-04 DIAGNOSIS — G35 Multiple sclerosis: Secondary | ICD-10-CM

## 2021-06-04 DIAGNOSIS — S92902A Unspecified fracture of left foot, initial encounter for closed fracture: Secondary | ICD-10-CM

## 2021-06-04 DIAGNOSIS — Z9221 Personal history of antineoplastic chemotherapy: Secondary | ICD-10-CM

## 2021-06-04 DIAGNOSIS — G629 Polyneuropathy, unspecified: Secondary | ICD-10-CM

## 2021-06-04 DIAGNOSIS — K219 Gastro-esophageal reflux disease without esophagitis: Secondary | ICD-10-CM

## 2021-06-04 DIAGNOSIS — M7918 Myalgia, other site: Secondary | ICD-10-CM

## 2021-06-04 DIAGNOSIS — S62109A Fracture of unspecified carpal bone, unspecified wrist, initial encounter for closed fracture: Secondary | ICD-10-CM

## 2021-06-04 DIAGNOSIS — M48061 Spinal stenosis, lumbar region without neurogenic claudication: Secondary | ICD-10-CM

## 2021-06-04 DIAGNOSIS — M1990 Unspecified osteoarthritis, unspecified site: Secondary | ICD-10-CM

## 2021-06-04 DIAGNOSIS — I10 Essential (primary) hypertension: Secondary | ICD-10-CM

## 2021-06-04 NOTE — Progress Notes
[facility] CLINIC NOTE       SUBJECTIVE: 76 year old female presenting in follow-up from last visit on 04/21/2021.  At that time we did a bilateral L3-4 transforaminal epidural steroid injection.  She reports having 100% relief of the pain going down on the right side but unfortunately not significant relief on the left side.  She reports the left side pain is little bit more lateral, off the spine.  She does have pretty significant scoliosis, levoscoliosis.  She feels like it is a muscle tightness that sits up below her ribs and above her hip bone.  She reports this is worse when she is standing and walking long distances, sitting in uncomfortable positions.  She is continuing her physical therapy.         Review of Systems    Current Outpatient Medications:   ?  acetaminophen SR(+) (TYLENOL) 650 mg tablet, Take three tablets by mouth twice daily., Disp: , Rfl:   ?  alendronate (FOSAMAX) 70 mg tablet, Take one tablet by mouth every 7 days. Take at least 30 minutes before breakfast with plain water. Do not lie down for 30 minutes., Disp: 12 tablet, Rfl: 3  ?  aspirin EC 81 mg tablet, Take one tablet by mouth daily. Take with food., Disp: , Rfl:   ?  cholecalciferol (VITAMIN D-3) 1,000 units tablet, Take one tablet by mouth daily., Disp: , Rfl:   ?  ferrous sulfate 325 mg (65 mg iron) tablet, Take one tablet by mouth daily., Disp: 30 tablet, Rfl: 3  ?  gabapentin (NEURONTIN) 300 mg capsule, Take 3 caps twice a day.  Indications: neuropathic pain, Disp: 540 capsule, Rfl: 3  ?  pantoprazole DR (PROTONIX) 20 mg tablet, Take one tablet by mouth daily., Disp: 90 tablet, Rfl: 3  ?  rosuvastatin (CRESTOR) 10 mg tablet, Take one tablet by mouth daily., Disp: 90 tablet, Rfl: 3  ?  telmisartan (MICARDIS) 20 mg tablet, Take one tablet by mouth daily., Disp: 90 tablet, Rfl: 3  Allergies   Allergen Reactions   ? Sulfa (Sulfonamide Antibiotics) HIVES and SHORTNESS OF BREATH     Physical Exam  Vitals:    06/04/21 1041   BP: (!) 151/83   BP Source: Arm, Right Upper   Pulse: 71   SpO2: 97%   PainSc: Two   Weight: 81.6 kg (179 lb 12.8 oz)   Height: 162.6 cm (5' 4.02)        Pain Score: Two  Body mass index is 30.85 kg/m?Marland Kitchen  Physical Exam ?  Gen: comfortable, NAD ?  HEENT: NCAT, anicteric sclera ?  Card: Extremities warm, well-perfused, cap refill <2sec ?  Pulm: no distress, not cyanotic ?  Abd: soft, non-distended ?  Skin: Skin is warm and dry.  Psychiatric: normal mood and affect. Behavior is normal.     Neuro ?  CNII-XII grossly normal ??  Mental status appropriate     MSK: ?  Inspection: grossly symmetric, no obvious deformity, no erythema ? ?             IMPRESSION:  1. Myalgia, other site    2. Neuroforaminal stenosis of lumbar spine          PLAN: Scheduling trigger point injections for left-sided myofascial pain secondary to her scoliotic curve.  She is also provided with a physician directed home exercise program for mid to upper back stretching and strengthening specifically the quadratus lumborum.

## 2021-06-25 ENCOUNTER — Encounter: Admit: 2021-06-25 | Discharge: 2021-06-25 | Payer: MEDICARE

## 2021-06-25 ENCOUNTER — Ambulatory Visit: Admit: 2021-06-25 | Discharge: 2021-06-25 | Payer: MEDICARE

## 2021-06-25 DIAGNOSIS — M549 Dorsalgia, unspecified: Secondary | ICD-10-CM

## 2021-06-25 DIAGNOSIS — I10 Essential (primary) hypertension: Secondary | ICD-10-CM

## 2021-06-25 DIAGNOSIS — G629 Polyneuropathy, unspecified: Secondary | ICD-10-CM

## 2021-06-25 DIAGNOSIS — M7918 Myalgia, other site: Secondary | ICD-10-CM

## 2021-06-25 DIAGNOSIS — Z9221 Personal history of antineoplastic chemotherapy: Secondary | ICD-10-CM

## 2021-06-25 DIAGNOSIS — S62109A Fracture of unspecified carpal bone, unspecified wrist, initial encounter for closed fracture: Secondary | ICD-10-CM

## 2021-06-25 DIAGNOSIS — S92902A Unspecified fracture of left foot, initial encounter for closed fracture: Secondary | ICD-10-CM

## 2021-06-25 DIAGNOSIS — E785 Hyperlipidemia, unspecified: Secondary | ICD-10-CM

## 2021-06-25 DIAGNOSIS — K219 Gastro-esophageal reflux disease without esophagitis: Secondary | ICD-10-CM

## 2021-06-25 DIAGNOSIS — M1990 Unspecified osteoarthritis, unspecified site: Secondary | ICD-10-CM

## 2021-06-25 DIAGNOSIS — C801 Malignant (primary) neoplasm, unspecified: Secondary | ICD-10-CM

## 2021-06-25 DIAGNOSIS — M48061 Spinal stenosis, lumbar region without neurogenic claudication: Secondary | ICD-10-CM

## 2021-06-25 DIAGNOSIS — G35 Multiple sclerosis: Secondary | ICD-10-CM

## 2021-06-25 MED ORDER — TRIAMCINOLONE ACETONIDE 40 MG/ML IJ SUSP
40 mg | Freq: Once | INTRAMUSCULAR | 0 refills | Status: CP | PRN
Start: 2021-06-25 — End: ?
  Administered 2021-06-25: 17:00:00 40 mg via INTRAMUSCULAR

## 2021-06-25 MED ORDER — BUPIVACAINE (PF) 0.25 % (2.5 MG/ML) IJ SOLN
9 mL | Freq: Once | INTRAMUSCULAR | 0 refills | Status: CP | PRN
Start: 2021-06-25 — End: ?
  Administered 2021-06-25: 17:00:00 9 mL via INTRAMUSCULAR

## 2021-06-25 NOTE — Procedures
Attending Surgeon: Lizbeth Bark, MD    Anesthesia: Local    Pre-Procedure Diagnosis:   1. Myalgia, other site    2. Neuroforaminal stenosis of lumbar spine        Post-Procedure Diagnosis:   1. Myalgia, other site    2. Neuroforaminal stenosis of lumbar spine        Pain Score: Four    [facility] SPINE TRIGGER POINT INJECTION  Locations: L cervical/thoracic/lumbar paraspinal, L lumbar and L gluteus maximus  Consent:   Consent obtained: verbal and written  Consent given by: patient  Alternatives discussed: alternative treatment  Discussed with patient the purpose of the treatment/procedure, other ways of treating my condition, including no treatment/ procedure and the risks and benefits of the alternatives. Patient has decided to proceed with treatment/procedure.        Universal Protocol:  Relevant documents: relevant documents present and verified  Test results: test results available and properly labeled  Imaging studies: imaging studies available  Required items: required blood products, implants, devices, and special equipment available  Site marked: the operative site was marked  Patient identity confirmed: Patient identify confirmed verbally with patient.        Time out: Immediately prior to procedure a time out was called to verify the correct patient, procedure, equipment, support staff and site/side marked as required      Procedures Details:   Indications: myalgia, lumbago and painPrep: alcohol  Needle size: 27 G  Number of muscles: 3 or more  Approach: posterior  Medications administered: 9 mL bupivacaine PF 0.25 %; 40 mg triamcinolone acetonide 40 mg/mL  Patient tolerance: Patient tolerated the procedure well with no immediate complications. Pressure was applied, and hemostasis was accomplished.  Comments: After verbal and written consent were provided, the patient was provided with 10 trigger point injections to the bilateral paraspinals, bilateral gluteus maximus , bilateral quadratus lumborum. 10 sites were identified and marked at the area of maximal tenderness. The patient's injections were done in the usual sterile fashion with alcohol used to clean the injection sites prior to injection. A 10 ml solution containing 40 mg triamcinolone and 9 mL of 0.25% bupivicaine was used with each site receiving 1ml of the solution. Patient tolerated the procedure well. There was no blood loss. Area was cleaned and bandaid applied. Patient was advised to avoid applying heat over injection sites and avoid soaking in bath tubs, hot tubs, or swimming pools for 3-4 days.           Estimated blood loss: none or minimal  Specimens: none  Patient tolerated the procedure well with no immediate complications. Pressure was applied, and hemostasis was accomplished.

## 2021-07-12 ENCOUNTER — Encounter: Admit: 2021-07-12 | Discharge: 2021-07-12 | Payer: MEDICARE

## 2021-07-12 DIAGNOSIS — G629 Polyneuropathy, unspecified: Secondary | ICD-10-CM

## 2021-07-12 MED ORDER — GABAPENTIN 300 MG PO CAP
ORAL_CAPSULE | 3 refills
Start: 2021-07-12 — End: ?

## 2021-07-12 NOTE — Telephone Encounter
Last office visit 01/26/21    No upcoming office visit

## 2021-07-15 ENCOUNTER — Ambulatory Visit: Admit: 2021-07-15 | Discharge: 2021-07-16 | Payer: MEDICARE

## 2021-07-15 ENCOUNTER — Encounter: Admit: 2021-07-15 | Discharge: 2021-07-15 | Payer: MEDICARE

## 2021-07-15 VITALS — BP 134/81 | HR 74 | Temp 97.20000°F | Ht 64.0 in | Wt 176.8 lb

## 2021-07-15 DIAGNOSIS — G35 Multiple sclerosis: Secondary | ICD-10-CM

## 2021-07-15 DIAGNOSIS — S92902A Unspecified fracture of left foot, initial encounter for closed fracture: Secondary | ICD-10-CM

## 2021-07-15 DIAGNOSIS — Z9221 Personal history of antineoplastic chemotherapy: Secondary | ICD-10-CM

## 2021-07-15 DIAGNOSIS — M549 Dorsalgia, unspecified: Secondary | ICD-10-CM

## 2021-07-15 DIAGNOSIS — K219 Gastro-esophageal reflux disease without esophagitis: Secondary | ICD-10-CM

## 2021-07-15 DIAGNOSIS — E785 Hyperlipidemia, unspecified: Secondary | ICD-10-CM

## 2021-07-15 DIAGNOSIS — S62109A Fracture of unspecified carpal bone, unspecified wrist, initial encounter for closed fracture: Secondary | ICD-10-CM

## 2021-07-15 DIAGNOSIS — M1990 Unspecified osteoarthritis, unspecified site: Secondary | ICD-10-CM

## 2021-07-15 DIAGNOSIS — C801 Malignant (primary) neoplasm, unspecified: Secondary | ICD-10-CM

## 2021-07-15 DIAGNOSIS — G629 Polyneuropathy, unspecified: Secondary | ICD-10-CM

## 2021-07-15 DIAGNOSIS — I10 Essential (primary) hypertension: Secondary | ICD-10-CM

## 2021-07-15 NOTE — Progress Notes
CHIEF COMPLAINT  Symptomatic paraesophageal hernia with GERD    HPI:  Rachael Black  Is a 76 y.o. old female who is sent by Doristine Locks, APRN-NP for further advicen and opinion in regards to symptomatic paraesophageal hernia with GERD.  She reports she has had issues with choking sensation after eating and drinking at times.  She denies any emesis, but the coughing can be quite severe at times.  Patient also reports reflux and has been taking PPI for many years with good control of symptoms.  She has had some mild SOB with activity.  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  Reviewing her medical records, she is noted to have large PEH as far back as a CT chest June 2021 without significant change.  She is here to discuss possible options.    Patient does have history of MS which has been stable and currently not taking medication.  She currently uses a cane for ambulation.    PMH:  Medical History:   Diagnosis Date   ? Arthritis    ? Back pain    ? Cancer (HCC) 06/2015    Breast    ? Foot fracture, left 09/26/2016   ? GERD (gastroesophageal reflux disease)    ? History of chemotherapy     neoadjuvant chemotherapy 2017   ? HLD (hyperlipidemia)    ? Hypertension    ? Multiple sclerosis (HCC) 1990   ? Neuropathy    ? Wrist fracture 09/26/2016    heads of ulna and radius       PSH:  Surgical History:   Procedure Laterality Date   ? HX RHINOPLASTY  1974   ? HX TUBAL LIGATION  1976   ? ANKLE SURGERY Right 2000    with instrumentation   ? Left radioactive seed localized lumpectomy, left axillary sentinel lymph node biopsy  Left 12/14/2015    Performed by Cordelia Poche, MD at [facility] OR   ? Left distal radius and ulna open reduction internal fixation Left 10/03/2019    Performed by Desiree Hane, MD at [facility] OR   ? Left distal radius and ulna open reduction internal fixation Left 10/03/2019    Performed by Desiree Hane, MD at [facility]   ? COLONOSCOPY ? FOOT SURGERY           Current Medications:    Current Outpatient Medications:   ?  acetaminophen SR(+) (TYLENOL) 650 mg tablet, Take three tablets by mouth twice daily., Disp: , Rfl:   ?  alendronate (FOSAMAX) 70 mg tablet, Take one tablet by mouth every 7 days. Take at least 30 minutes before breakfast with plain water. Do not lie down for 30 minutes., Disp: 12 tablet, Rfl: 3  ?  aspirin EC 81 mg tablet, Take one tablet by mouth daily. Take with food., Disp: , Rfl:   ?  cholecalciferol (VITAMIN D-3) 1,000 units tablet, Take one tablet by mouth daily., Disp: , Rfl:   ?  ferrous sulfate 325 mg (65 mg iron) tablet, Take one tablet by mouth daily., Disp: 30 tablet, Rfl: 3  ?  gabapentin (NEURONTIN) 300 mg capsule, Take 3 caps twice a day.  Indications: neuropathic pain, Disp: 540 capsule, Rfl: 1  ?  pantoprazole DR (PROTONIX) 20 mg tablet, Take one tablet by mouth daily., Disp: 90 tablet, Rfl: 3  ?  rosuvastatin (CRESTOR) 10 mg tablet, Take one tablet by mouth  daily., Disp: 90 tablet, Rfl: 3  ?  telmisartan (MICARDIS) 20 mg tablet, Take one tablet by mouth daily., Disp: 90 tablet, Rfl: 3    Allergies:  Allergies   Allergen Reactions   ? Sulfa (Sulfonamide Antibiotics) HIVES and SHORTNESS OF BREATH       SHx:  Social History     Tobacco Use   ? Smoking status: Former     Packs/day: 1.00     Years: 10.00     Pack years: 10.00     Types: Cigarettes     Quit date: 02/28/2005     Years since quitting: 16.3   ? Smokeless tobacco: Never   Vaping Use   ? Vaping Use: Never used   Substance Use Topics   ? Alcohol use: Yes     Alcohol/week: 8.0 standard drinks of alcohol     Types: 8 Standard drinks or equivalent per week   ? Drug use: No       FHx:  Family History   Problem Relation Age of Onset   ? Arthritis-osteo Mother    ? Thyroid Disease Mother    ? Heart Disease Father    ? High Cholesterol Father    ? Cancer Maternal Aunt    ? Cancer Maternal Uncle    ? Cancer Paternal Uncle    ? Cancer-Colon Maternal Grandmother 38 ? Stroke Maternal Grandmother    ? Cancer-Colon Paternal Grandmother        ROS:   Review of Systems   Respiratory: Positive for cough.    Musculoskeletal: Positive for back pain, joint swelling and myalgias.   Allergic/Immunologic: Positive for environmental allergies.   Neurological: Positive for facial asymmetry and numbness.       PE:    Vitals:    07/15/21 1019   BP: 134/81   Pulse: 74   Temp: 36.2 ?C (97.2 ?F)   SpO2: 97%        80.2 kg (176 lb 12.8 oz)  Body mass index is 30.35 kg/m?Marland Kitchen  No flowsheet data found.     GENERAL:  Alert and oriented x 3, not in acute distress.  HEAD:  Normocephalic, atraumatic.  EYES:  EOM intact, no conjunctivitis or jaundice.  NECK:  Supple, no thyromegaly or bruit.  SKIN:  No rashes or ulcers appreciated  LUNGS:  Symmetric expansion, clear to auscultation bilaterally, no crackles or wheezing.  HEART:  Regular rate and rhythm  ABDOMEN:  Obese, soft, non-tender, non-distended  EXTREMITIES:  No cyanosis, clubbing or edema.  NEURO:  Moves all 4 extremities without difficulty, gait is appropriate.  PSYCH:  Appropriate mood and behavior.       Lab/Radiology/Other Diagnostic Tests:  Hemoglobin   Date Value Ref Range Status   01/26/2021 13.6 12.0 - 15.0 GM/DL Final     Absolute Monocyte Count   Date Value Ref Range Status   01/26/2021 0.75 0 - 0.80 K/UL Final     Sodium   Date Value Ref Range Status   09/14/2020 139 137 - 147 MMOL/L Final     Potassium   Date Value Ref Range Status   09/14/2020 4.6 3.5 - 5.1 MMOL/L Final     Chloride   Date Value Ref Range Status   09/14/2020 99 98 - 110 MMOL/L Final     CO2   Date Value Ref Range Status   09/14/2020 28 21 - 30 MMOL/L Final     Anion Gap   Date Value Ref Range Status  09/14/2020 12 3 - 12 Final     Blood Urea Nitrogen   Date Value Ref Range Status   09/14/2020 10 7 - 25 MG/DL Final     Creatinine   Date Value Ref Range Status   09/14/2020 0.94 0.4 - 1.00 MG/DL Final     Glucose   Date Value Ref Range Status   09/14/2020 79 70 - 100 MG/DL Final     Calcium   Date Value Ref Range Status   09/14/2020 10.2 8.5 - 10.6 MG/DL Final     AST (SGOT)   Date Value Ref Range Status   09/14/2020 13 7 - 40 U/L Final     ALT (SGPT)   Date Value Ref Range Status   09/14/2020 9 7 - 56 U/L Final     Alk Phosphatase   Date Value Ref Range Status   09/14/2020 56 25 - 110 U/L Final     Cholesterol   Date Value Ref Range Status   09/14/2020 155 <200 MG/DL Final     Triglycerides   Date Value Ref Range Status   09/14/2020 124 <150 MG/DL Final     HDL   Date Value Ref Range Status   09/14/2020 75 >40 MG/DL Final     LDL   Date Value Ref Range Status   09/14/2020 66 <100 mg/dL Final     VLDL   Date Value Ref Range Status   09/14/2020 25 MG/DL Final     TSH   Date Value Ref Range Status   05/05/2019 1.50 0.35 - 5.00 MCU/ML Final     No results found for: HGBA1C  No results found for this or any previous visit.  No results found for this or any previous visit.        Assessment:  Symptomatic large PEH with GERD    Plan:  I have recommended we proceed with UGI to assess esophageal motility.  Manometry may be difficult due to large hernia.  She also reports she is scheduled for colonoscopy with [facility] July 13.  I will have my staff contact GI team to add on EGD as well.  I have indicated she would be appropriate candidate to proceed with laparoscopic paraesophageal hernia repair with partial/full fundoplication.  We did discuss risks of surgery including but not limited to infection, bleeding, possible injury to surrounding structures which may require further intervention.  We discussed risk for recurrence of hernia or slippage of fundoplication which may require further intervention.  We also discussed possibility of persistence of reflux symptoms which may require medication.  Patient and husband did not have any questions at this time.  We will set up surgery once she has completed all workup.  I indicated she would be a candidate for surgery at [facility] due to complexity of her presentation (with mild emphysema, large PEH, history of MS)         Rudean Haskell, MD                                                                            07/15/21

## 2021-07-16 DIAGNOSIS — K449 Diaphragmatic hernia without obstruction or gangrene: Principal | ICD-10-CM

## 2021-07-19 ENCOUNTER — Encounter: Admit: 2021-07-19 | Discharge: 2021-07-19 | Payer: MEDICARE

## 2021-07-19 DIAGNOSIS — K449 Diaphragmatic hernia without obstruction or gangrene: Secondary | ICD-10-CM

## 2021-08-02 ENCOUNTER — Encounter: Admit: 2021-08-02 | Discharge: 2021-08-02 | Payer: MEDICARE

## 2021-08-03 ENCOUNTER — Encounter: Admit: 2021-08-03 | Discharge: 2021-08-03 | Payer: MEDICARE

## 2021-08-03 ENCOUNTER — Ambulatory Visit: Admit: 2021-08-03 | Discharge: 2021-08-03 | Payer: MEDICARE

## 2021-08-03 DIAGNOSIS — K449 Diaphragmatic hernia without obstruction or gangrene: Secondary | ICD-10-CM

## 2021-08-03 MED ORDER — BARIUM SULFATE 98 % PO SUSR
130 mL | Freq: Once | ORAL | 0 refills | Status: CP
Start: 2021-08-03 — End: ?
  Administered 2021-08-03: 15:00:00 30 mL via ORAL

## 2021-08-03 MED ORDER — SOD BICARB-CITRIC AC-SIMETH 2.21-1.53 GRAM/4 GRAM PO GREP
1 | Freq: Once | ORAL | 0 refills | Status: CP
Start: 2021-08-03 — End: ?

## 2021-08-03 MED ORDER — BARIUM SULFATE 700 MG PO TAB
700 mg | Freq: Once | ORAL | 0 refills | Status: CP
Start: 2021-08-03 — End: ?
  Administered 2021-08-03: 15:00:00 700 mg via ORAL

## 2021-08-03 MED ORDER — BARIUM SULFATE 40 % (W/V) PO SUSP
60 mL | Freq: Once | ORAL | 0 refills | Status: CP
Start: 2021-08-03 — End: ?
  Administered 2021-08-03: 15:00:00 30 mL via ORAL

## 2021-08-09 ENCOUNTER — Encounter: Admit: 2021-08-09 | Discharge: 2021-08-09 | Payer: MEDICARE

## 2021-08-09 DIAGNOSIS — Z9221 Personal history of antineoplastic chemotherapy: Secondary | ICD-10-CM

## 2021-08-09 DIAGNOSIS — C801 Malignant (primary) neoplasm, unspecified: Secondary | ICD-10-CM

## 2021-08-09 DIAGNOSIS — I10 Essential (primary) hypertension: Secondary | ICD-10-CM

## 2021-08-09 DIAGNOSIS — M1990 Unspecified osteoarthritis, unspecified site: Secondary | ICD-10-CM

## 2021-08-09 DIAGNOSIS — S92902A Unspecified fracture of left foot, initial encounter for closed fracture: Secondary | ICD-10-CM

## 2021-08-09 DIAGNOSIS — K219 Gastro-esophageal reflux disease without esophagitis: Secondary | ICD-10-CM

## 2021-08-09 DIAGNOSIS — S62109A Fracture of unspecified carpal bone, unspecified wrist, initial encounter for closed fracture: Secondary | ICD-10-CM

## 2021-08-09 DIAGNOSIS — M549 Dorsalgia, unspecified: Secondary | ICD-10-CM

## 2021-08-09 DIAGNOSIS — G629 Polyneuropathy, unspecified: Secondary | ICD-10-CM

## 2021-08-09 DIAGNOSIS — G35 Multiple sclerosis: Secondary | ICD-10-CM

## 2021-08-13 ENCOUNTER — Encounter: Admit: 2021-08-13 | Discharge: 2021-08-13 | Payer: MEDICARE

## 2021-08-13 ENCOUNTER — Ambulatory Visit: Admit: 2021-08-13 | Discharge: 2021-08-13 | Payer: MEDICARE

## 2021-08-13 DIAGNOSIS — Z9221 Personal history of antineoplastic chemotherapy: Secondary | ICD-10-CM

## 2021-08-13 DIAGNOSIS — S92902A Unspecified fracture of left foot, initial encounter for closed fracture: Secondary | ICD-10-CM

## 2021-08-13 DIAGNOSIS — I10 Essential (primary) hypertension: Secondary | ICD-10-CM

## 2021-08-13 DIAGNOSIS — M1990 Unspecified osteoarthritis, unspecified site: Secondary | ICD-10-CM

## 2021-08-13 DIAGNOSIS — M549 Dorsalgia, unspecified: Secondary | ICD-10-CM

## 2021-08-13 DIAGNOSIS — C801 Malignant (primary) neoplasm, unspecified: Secondary | ICD-10-CM

## 2021-08-13 DIAGNOSIS — G35 Multiple sclerosis: Secondary | ICD-10-CM

## 2021-08-13 DIAGNOSIS — S62109A Fracture of unspecified carpal bone, unspecified wrist, initial encounter for closed fracture: Secondary | ICD-10-CM

## 2021-08-13 DIAGNOSIS — K219 Gastro-esophageal reflux disease without esophagitis: Secondary | ICD-10-CM

## 2021-08-13 DIAGNOSIS — G629 Polyneuropathy, unspecified: Secondary | ICD-10-CM

## 2021-08-13 MED ORDER — PROPOFOL 10 MG/ML IV EMUL 20 ML (INFUSION)(AM)(OR)
INTRAVENOUS | 0 refills | Status: DC
Start: 2021-08-13 — End: 2021-08-13

## 2021-08-13 MED ORDER — LIDOCAINE (PF) 20 MG/ML (2 %) IJ SOLN
INTRAVENOUS | 0 refills | Status: DC
Start: 2021-08-13 — End: 2021-08-13

## 2021-08-13 MED ORDER — PROPOFOL INJ 10 MG/ML IV VIAL
INTRAVENOUS | 0 refills | Status: DC
Start: 2021-08-13 — End: 2021-08-13

## 2021-08-16 ENCOUNTER — Encounter: Admit: 2021-08-16 | Discharge: 2021-08-16 | Payer: MEDICARE

## 2021-08-16 DIAGNOSIS — M549 Dorsalgia, unspecified: Secondary | ICD-10-CM

## 2021-08-16 DIAGNOSIS — S62109A Fracture of unspecified carpal bone, unspecified wrist, initial encounter for closed fracture: Secondary | ICD-10-CM

## 2021-08-16 DIAGNOSIS — M1990 Unspecified osteoarthritis, unspecified site: Secondary | ICD-10-CM

## 2021-08-16 DIAGNOSIS — K219 Gastro-esophageal reflux disease without esophagitis: Secondary | ICD-10-CM

## 2021-08-16 DIAGNOSIS — I10 Essential (primary) hypertension: Secondary | ICD-10-CM

## 2021-08-16 DIAGNOSIS — G35 Multiple sclerosis: Secondary | ICD-10-CM

## 2021-08-16 DIAGNOSIS — Z9221 Personal history of antineoplastic chemotherapy: Secondary | ICD-10-CM

## 2021-08-16 DIAGNOSIS — C801 Malignant (primary) neoplasm, unspecified: Secondary | ICD-10-CM

## 2021-08-16 DIAGNOSIS — S92902A Unspecified fracture of left foot, initial encounter for closed fracture: Secondary | ICD-10-CM

## 2021-08-16 DIAGNOSIS — G629 Polyneuropathy, unspecified: Secondary | ICD-10-CM

## 2021-08-19 ENCOUNTER — Encounter: Admit: 2021-08-19 | Discharge: 2021-08-19 | Payer: MEDICARE

## 2021-08-19 ENCOUNTER — Ambulatory Visit: Admit: 2021-08-19 | Discharge: 2021-08-20 | Payer: MEDICARE

## 2021-08-19 DIAGNOSIS — M1990 Unspecified osteoarthritis, unspecified site: Secondary | ICD-10-CM

## 2021-08-19 DIAGNOSIS — Z9221 Personal history of antineoplastic chemotherapy: Secondary | ICD-10-CM

## 2021-08-19 DIAGNOSIS — K219 Gastro-esophageal reflux disease without esophagitis: Secondary | ICD-10-CM

## 2021-08-19 DIAGNOSIS — C801 Malignant (primary) neoplasm, unspecified: Secondary | ICD-10-CM

## 2021-08-19 DIAGNOSIS — S92902A Unspecified fracture of left foot, initial encounter for closed fracture: Secondary | ICD-10-CM

## 2021-08-19 DIAGNOSIS — I10 Essential (primary) hypertension: Secondary | ICD-10-CM

## 2021-08-19 DIAGNOSIS — S62109A Fracture of unspecified carpal bone, unspecified wrist, initial encounter for closed fracture: Secondary | ICD-10-CM

## 2021-08-19 DIAGNOSIS — K449 Diaphragmatic hernia without obstruction or gangrene: Secondary | ICD-10-CM

## 2021-08-19 DIAGNOSIS — G629 Polyneuropathy, unspecified: Secondary | ICD-10-CM

## 2021-08-19 DIAGNOSIS — M549 Dorsalgia, unspecified: Secondary | ICD-10-CM

## 2021-08-19 DIAGNOSIS — G35 Multiple sclerosis: Secondary | ICD-10-CM

## 2021-08-19 NOTE — Progress Notes
Department of Metabolic, Bariatric, and Minimally Invasive Surgery         Reason for Visit:  Follow-up for symptomatic paraesophageal hernia with GERD    HPI:  Rachael Black is a 76 y.o. female seen today in follow up to review results of UGI completed 08/03/2021 and EGD 08/13/2021.    In summary of their prior history:  Patient was seen in consultation 07/15/2021 for symptomatic paraesophageal hernia with GERD.  She reports she has had issues with choking sensation after eating and drinking at times.  She denies any emesis, but the coughing can be quite severe at times.  Patient also reports reflux and has been taking PPI for many years with good control of symptoms.  She has had some mild SOB with activity.  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  Reviewing her medical records, she is noted to have large PEH as far back as a CT chest June 2021 without significant change.  Patient does have history of MS which has been stable and currently not taking medication.  She currently uses a cane for ambulation.    Since her last visit, UGI results and images reviewed.  She does have large paraesophageal hernia.  There is mild dysmotility but passed the pill easily.  EGD was completed and reviewed with confirmation of 10cm long hiatal hernia without evidence of esophagitis or gastritis.    PMH:       Medical History:   Diagnosis Date   ? Arthritis ?   ? Back pain ?   ? Cancer (HCC) 06/2015   ? Breast    ? Foot fracture, left 09/26/2016   ? GERD (gastroesophageal reflux disease) ?   ? History of chemotherapy ?   ? neoadjuvant chemotherapy 2017   ? HLD (hyperlipidemia) ?   ? Hypertension ?   ? Multiple sclerosis (HCC) 1990   ? Neuropathy ?   ? Wrist fracture 09/26/2016   ? heads of ulna and radius   ?  ?  PSH:        Surgical History:   Procedure Laterality Date   ? HX RHINOPLASTY ? 1974   ? HX TUBAL LIGATION ? 1976   ? ANKLE SURGERY Right 2000   ? with instrumentation   ? Left radioactive seed localized lumpectomy, left axillary sentinel lymph node biopsy  Left 12/14/2015   ? Performed by Cordelia Poche, MD at [facility] OR   ? Left distal radius and ulna open reduction internal fixation Left 10/03/2019   ? Performed by Desiree Hane, MD at [facility] OR   ? Left distal radius and ulna open reduction internal fixation Left 10/03/2019   ? Performed by Desiree Hane, MD at [facility] OR   ? COLONOSCOPY ? ?   ? FOOT SURGERY           Current Medications:    Current Outpatient Medications:   ?  acetaminophen SR(+) (TYLENOL) 650 mg tablet, Take three tablets by mouth twice daily., Disp: , Rfl:   ?  alendronate (FOSAMAX) 70 mg tablet, Take one tablet by mouth every 7 days. Take at least 30 minutes before breakfast with plain water. Do not lie down for 30 minutes., Disp: 12 tablet, Rfl: 3  ?  cholecalciferol (VITAMIN D-3) 1,000 units tablet, Take one tablet by mouth daily., Disp: , Rfl:   ?  ferrous sulfate 325 mg (65 mg iron) tablet, Take  one tablet by mouth daily., Disp: 30 tablet, Rfl: 3  ?  gabapentin (NEURONTIN) 300 mg capsule, Take 3 caps twice a day.  Indications: neuropathic pain, Disp: 540 capsule, Rfl: 1  ?  pantoprazole DR (PROTONIX) 20 mg tablet, Take one tablet by mouth daily., Disp: 90 tablet, Rfl: 3  ?  peg-electrolyte solution (NULYTELY LEMON-LIME) 420 gram oral solution, Mix as directed on package. Refrigerate once mixed. Do not mix greater than 24 hours prior to procedure. Drink 3/4 of bottle between 5pm and 7pm the night before procedure. Drink remaining 1/4 of bottle 5 hours prior to procedure., Disp: 4000 mL, Rfl: 0  ?  rosuvastatin (CRESTOR) 10 mg tablet, Take one tablet by mouth daily., Disp: 90 tablet, Rfl: 3  ?  telmisartan (MICARDIS) 20 mg tablet, Take one tablet by mouth daily., Disp: 90 tablet, Rfl: 3    Allergies:  Allergies   Allergen Reactions   ? Sulfa (Sulfonamide Antibiotics) HIVES and SHORTNESS OF BREATH     ROS:   Review of Systems   Respiratory: Positive for cough.    Musculoskeletal: Positive for back pain, joint swelling and myalgias.   Allergic/Immunologic: Positive for environmental allergies.   Neurological: Positive for facial asymmetry and numbness.       PE:  Vitals:    08/19/21 0911   BP: (!) 146/85   BP Source: Arm, Right Upper   Pulse: 73   Temp: 36.6 ?C (97.8 ?F)   SpO2: 97%   TempSrc: Temporal   Weight: 80.9 kg (178 lb 6.4 oz)   Height: 162.6 cm (5' 4)        80.9 kg (178 lb 6.4 oz)  Body mass index is 30.62 kg/m?Marland Kitchen  GENERAL:  Alert and oriented x 3, not in acute distress.  HEAD:  Normocephalic, atraumatic.  EYES:  EOM intact, no conjunctivitis or jaundice.  NECK:  Supple, no thyromegaly or bruit.  SKIN:  No rashes or ulcers appreciated  LUNGS:  Symmetric expansion, clear to auscultation bilaterally, no crackles or wheezing.  HEART:  Regular rate and rhythm  ABDOMEN:  Obese, soft, non-tender, non-distended  EXTREMITIES:  No cyanosis, clubbing or edema.  NEURO:  Moves all 4 extremities without difficulty, gait is appropriate.  PSYCH:  Appropriate mood and behavior.      Lab/Radiology/Other Diagnostic Tests:  Hemoglobin   Date Value Ref Range Status   01/26/2021 13.6 12.0 - 15.0 GM/DL Final     Absolute Monocyte Count   Date Value Ref Range Status   01/26/2021 0.75 0 - 0.80 K/UL Final     Sodium   Date Value Ref Range Status   09/14/2020 139 137 - 147 MMOL/L Final     Potassium   Date Value Ref Range Status   09/14/2020 4.6 3.5 - 5.1 MMOL/L Final     Chloride   Date Value Ref Range Status   09/14/2020 99 98 - 110 MMOL/L Final     CO2   Date Value Ref Range Status   09/14/2020 28 21 - 30 MMOL/L Final     Anion Gap   Date Value Ref Range Status   09/14/2020 12 3 - 12 Final     Blood Urea Nitrogen   Date Value Ref Range Status   09/14/2020 10 7 - 25 MG/DL Final     Creatinine   Date Value Ref Range Status   09/14/2020 0.94 0.4 - 1.00 MG/DL Final     Glucose   Date Value Ref Range Status  09/14/2020 79 70 - 100 MG/DL Final     Calcium   Date Value Ref Range Status   09/14/2020 10.2 8.5 - 10.6 MG/DL Final     AST (SGOT)   Date Value Ref Range Status   09/14/2020 13 7 - 40 U/L Final     ALT (SGPT)   Date Value Ref Range Status   09/14/2020 9 7 - 56 U/L Final     Alk Phosphatase   Date Value Ref Range Status   09/14/2020 56 25 - 110 U/L Final     Cholesterol   Date Value Ref Range Status   09/14/2020 155 <200 MG/DL Final     Triglycerides   Date Value Ref Range Status   09/14/2020 124 <150 MG/DL Final     HDL   Date Value Ref Range Status   09/14/2020 75 >40 MG/DL Final     LDL   Date Value Ref Range Status   09/14/2020 66 <100 mg/dL Final     VLDL   Date Value Ref Range Status   09/14/2020 25 MG/DL Final     TSH   Date Value Ref Range Status   05/05/2019 1.50 0.35 - 5.00 MCU/ML Final     No results found for: HGBA1C      Assessment:  76 y.o. female with symptomatic large paraesophageal hernia with GERD      Plan:  We have reviewed her recent UGI and EGD.  I have indicated patient would be appropriate candidate for laparoscopic paraesophageal hernia repair, Nissen fundoplication, possible EGD, possible mesh. We went over the risks of procedure again, including but not limited to infection, bleeding, possible injury to surrounding structures which may require further intervention.  We discussed risk for recurrence of hernia or slippage of fundoplication which may require further intervention.  We also discussed possibility of persistence of reflux symptoms which may require medication.     Patient will be scheduled for surgery at [facility] due to complexity of presentation. (with mild emphysema, large PEH, history of MS)             Total Time Today was 32 minutes in the following activities: Preparing to see the patient, Performing a medically appropriate examination and/or evaluation, Counseling and educating the patient/family/caregiver, Documenting clinical information in the electronic or other health record and Independently interpreting results (not separately reported) and communicating results to the patient/family/caregiver      Rudean Haskell, MD                                                                            08/19/21

## 2021-08-19 NOTE — Patient Instructions
NISSEN FUNDOPLICATION/HIATAL HERNIA REPAIR POSTOP DIET    Laparoscopic Nissen fundoplication and possible hiatal hernia repair is a surgery for patients suffering from gastroesophageal reflux disease, also known as GERD.  After this procedure is performed, it is necessary to go on this postoperative diet.  Carefully following this diet can minimize pain and maximize the chance of the surgeries success.    WEEK ONE    CLEAR AND FULL LIQUIDS.  Some examples of CLEAR LIQUIDS are water, Jell-O, broth, Crystal light, popsicles, lemonade, apple juice, cranberry juice, grape juice and decaffeinated tea or coffee.  Some examples of FULL LIQUIDS are pudding, cream soups, protein shakes.    NOT ADVISED:  Caffeinated beverages, carbonated drinks, thick liquids, solids, gum, alcohol and hard candy.    WEEK TWO    You may add pured foods to your diet.  Foods that you can pure in a blender include fruit without pulp or seeds, pasta, mashed potatoes, vegetables, meat and oatmeal.  Ice cream, baby food, milk shakes and instant breakfast drinks are also acceptable diet choices.    WEEK THREE    Increased to a soft food diet.  Some recommended soft foods or meat with gravy, soft cooked scrambled eggs, hot cereals, cold cereal softened milk, soft cheeses, sushi and soft cut up pasta.    NOT ADVISED:  Raw vegetables, steak, chips, tacos, bread, sandwiches, bread and hamburgers.    WEEK FOUR    Increase diet as tolerated but, be sure to take small bites and chew well.      While on this diet, it is best to eat 4-6 meals a day.  Eating slowly and exercising lightly aids indigestion and eases discomfort.  Another good rule of thumb is to stay in an upright position while eating or drinking, and remain that way 20 minutes after you are done.  If pain or discomfort persists, please contact your physician.

## 2021-08-23 ENCOUNTER — Encounter: Admit: 2021-08-23 | Discharge: 2021-08-23 | Payer: MEDICARE

## 2021-08-23 DIAGNOSIS — K449 Diaphragmatic hernia without obstruction or gangrene: Secondary | ICD-10-CM

## 2021-08-23 DIAGNOSIS — K219 Gastro-esophageal reflux disease without esophagitis: Secondary | ICD-10-CM

## 2021-08-23 MED ORDER — CEFAZOLIN IVPB
3 g | Freq: Once | INTRAVENOUS | 0 refills
Start: 2021-08-23 — End: ?

## 2021-08-23 MED ORDER — FAMOTIDINE (PF) 20 MG/2 ML IV SOLN
20 mg | Freq: Once | INTRAVENOUS | 0 refills
Start: 2021-08-23 — End: ?

## 2021-08-23 MED ORDER — ACETAMINOPHEN 1,000 MG/100 ML (10 MG/ML) IV SOLN
1000 mg | Freq: Once | INTRAVENOUS | 0 refills
Start: 2021-08-23 — End: ?

## 2021-09-15 ENCOUNTER — Encounter: Admit: 2021-09-15 | Discharge: 2021-09-15 | Payer: MEDICARE

## 2021-09-21 ENCOUNTER — Encounter: Admit: 2021-09-21 | Discharge: 2021-09-21 | Payer: MEDICARE

## 2021-09-21 ENCOUNTER — Ambulatory Visit: Admit: 2021-09-21 | Discharge: 2021-09-21 | Payer: MEDICARE

## 2021-09-21 DIAGNOSIS — S92902A Unspecified fracture of left foot, initial encounter for closed fracture: Secondary | ICD-10-CM

## 2021-09-21 DIAGNOSIS — G35 Multiple sclerosis: Secondary | ICD-10-CM

## 2021-09-21 DIAGNOSIS — S62109A Fracture of unspecified carpal bone, unspecified wrist, initial encounter for closed fracture: Secondary | ICD-10-CM

## 2021-09-21 DIAGNOSIS — K219 Gastro-esophageal reflux disease without esophagitis: Secondary | ICD-10-CM

## 2021-09-21 DIAGNOSIS — M1990 Unspecified osteoarthritis, unspecified site: Secondary | ICD-10-CM

## 2021-09-21 DIAGNOSIS — I10 Essential (primary) hypertension: Secondary | ICD-10-CM

## 2021-09-21 DIAGNOSIS — C801 Malignant (primary) neoplasm, unspecified: Secondary | ICD-10-CM

## 2021-09-21 DIAGNOSIS — Z01818 Encounter for other preprocedural examination: Secondary | ICD-10-CM

## 2021-09-21 DIAGNOSIS — Z9221 Personal history of antineoplastic chemotherapy: Secondary | ICD-10-CM

## 2021-09-21 DIAGNOSIS — M549 Dorsalgia, unspecified: Secondary | ICD-10-CM

## 2021-09-21 DIAGNOSIS — G629 Polyneuropathy, unspecified: Secondary | ICD-10-CM

## 2021-09-21 LAB — BASIC METABOLIC PANEL
ANION GAP: 9 pg — ABNORMAL HIGH (ref 3–12)
BLD UREA NITROGEN: 13 mg/dL (ref 7–25)
CALCIUM: 9.2 mg/dL — ABNORMAL LOW (ref 8.5–10.6)
CHLORIDE: 101 MMOL/L (ref 98–110)
CO2: 24 MMOL/L — ABNORMAL HIGH (ref 21–30)
CREATININE: 0.7 mg/dL (ref 0.4–1.00)
EGFR: >60 mL/min (ref >60)
GLUCOSE,PANEL: 103 mg/dL — ABNORMAL HIGH (ref 70–100)
POTASSIUM: 4.2 MMOL/L — ABNORMAL HIGH (ref 3.5–5.1)
SODIUM: 134 MMOL/L — ABNORMAL LOW (ref 137–147)

## 2021-09-21 LAB — CBC: WBC COUNT: 5.5 K/UL (ref 4.5–11.0)

## 2021-09-21 NOTE — Pre-Anesthesia Patient Instructions
PREPROCEDURE INFORMATION    Arrival at the hospital  [facility]  [address]  [place], [place] [address]    Park in the [employer], located directly across from the main entrance to the hospital.  Enter through the ground floor main hospital entrance and check in at the Information Desk in the lobby.  They will validate your parking ticket and direct you to the next location.  If you are a woman between the ages of 54 and 48, and have not had a hysterectomy, you will be asked for a urine sample prior to surgery.  Please do not urinate before arriving in the Surgery Waiting Room.  Once there, check in and let the attendant know if you need to provide a sample.    You will receive a call with your surgery arrival time between 2:30pm and 4:30pm the last business day before your procedure.  If you do not receive a call, please call [phone] before 4:30pm or [phone] after 4:30pm.    Eating or drinking before surgery  Do not eat or drink anything after 11:00 p.m. the day before your procedure (including gum, mints, candy, or chewing tobacco) OR follow the specific instructions you were given by your Surgeon.  You may have WATER ONLY up to 2 hours before arriving at the hospital.    Planning transportation for outpatient procedure  For your safety, you will need to arrange for a responsible ride/person to accompany you home due to sedation or anesthesia with your procedure.  An [employer] Goad, taxi or other public transportation driver is not considered a responsible person to accompany you home.    Bath/Shower Instructions  Take a bath or shower using the special soap given to you in PAC. Use half the bottle the night before, and the other half the morning of your procedure. Use clean towels with each bath or shower.  Put on clean clothes after bath or shower.  Avoid using lotion and oils.  Sleep on clean sheets if bath or shower is done the night before procedure.    Morning of your procedure:  Brush your teeth and tongue  Do not smoke, vape, chew or user any tobacco products.  Do not shave the area where you will have surgery.  Remove nail polish, makeup and all jewelry (including piercings) before coming to the hospital.  Dress in clean, loose, comfortable clothing.    Valuables  Leave money, credit cards, jewelry, and any other valuables at home. The [facility] is not responsible for the loss or breakage of personal items.    What to bring to the hospital  ID/Insurance card  Medical Device card  Official documents for legal guardianship  Copy of your Living Will, Advanced Directives, and/or Durable Power of Attorney. If you have these documents, please bring them to the admissions office on the day of your surgery to be scanned into your records.  Do not bring medications from home unless instructed by a pharmacist.  CPAP/BiPAP machine (including all supplies)  Walker, cane, or motorized scooter  Cases for glasses/hearing aids/contact lens (bring solutions for contacts)     Notify us at [facility]: [phone] on the day of your procedure if:  You need to cancel your procedure.  You are going to be late.    Notify your surgeon if:  There is a possibility that you are pregnant.  You become ill with a cough, fever, sore throat, nausea, vomiting or  flu-like symptoms.  You have any open wounds/sores that are red, painful, draining, or are new since you last saw the doctor.  You need to cancel your procedure.    Preparing to get your medications at discharge  Your surgeon may prescribe you medications to take after your procedure.  If you like the convenience of having your medications filled here at [place], please do the following:  Go to [place] pharmacy after your [facility] appointment to put a credit card on file.  Call [place] pharmacy at [phone] (Monday-Friday 7am-9pm or Saturday and Sunday 9am-5pm) to put a credit card on file.  Bring a credit card or cash on the day of your procedure- please leave with a family member rather than bringing it into the preop area.    Current Visitor Policy:  Visitors must be free of fever and symptoms to be in our facilities.  No more than 2 visitors per patient are allowed.  Additional guidelines may vary, based on patient care area or patient's condition.  Patients in semiprivate rooms may have visitors, but visits should be coordinated so only two total visitors are in a room at a time due to space limitations.  Children younger than age 51 are allowed to visit inpatients.    Thank you for participating in your Preoperative Assessment Clinic visit today.    If you have any changes to your health or hospitalizations between now and your surgery, please call us at [phone] for Main instructions.    Instructions given to patient via: email and verbal

## 2021-09-21 NOTE — Telephone Encounter
Rec'd call from pre-anesthesia testing - patient has surgery coming up 8/16 for which she is supposed to hold aspirin for 7 days prior.  They wanted [place] to know that Rachael Black has self-d'cd her aspirin due to something she saw on TV so she is not on it right now anyway.  Will discuss with Dr. Frutoso Chase at patient's upcoming appointment on 8/11.

## 2021-09-21 NOTE — Unmapped
Anesthesia Pre-Procedure Evaluation    Name: Rachael Black      MRN: [id number]     DOB: [birth year]     Age: 76 y.o.     Sex: female   __________________________________________________________________________       Procedure Information     Date/Time: 10/13/21 0800    Procedures:       LAPAROSCOPIC REPAIR PARAESOPHAGEAL HERNIA WITH/ WITHOUT FUNDOPLASTY AND IMPLANTATION OF MESH - 1 hr, Phasix ST Mesh 7x10cm, possible ACell mesh 7x10cm, tisseel biologic tissue glue with laparoscopic applicator      ESOPHAGOGASTRODUODENOSCOPY WITH SPECIMEN COLLECTION BY BRUSHING/ WASHING    Location: MAIN OR 57 / Main OR/Periop    Surgeons: Rudean Haskell, MD          Physical Assessment  Vital Signs (last filed in past 24 hours):  BP: 126/75 (07/25 1235)  Temp: 36.3 ?C (97.3 ?F) (07/25 1235)  Pulse: 80 (07/25 1235)  Respirations: 16 PER MINUTE (07/25 1235)  SpO2: 95 % (07/25 1235)  O2 Device: None (Room air) (07/25 1235)  Height: 162.6 cm (5' 4) (07/25 1235)  Weight: 80.8 kg (178 lb 3.2 oz) (07/25 1235)      Patient History  Allergies   Allergen Reactions   ? Sulfa (Sulfonamide Antibiotics) HIVES and SHORTNESS OF BREATH        Current Medications    Medication Directions   acetaminophen SR(+) (TYLENOL) 650 mg tablet Take two tablets by mouth twice daily. Two tablets in the morning and three at night as needed for pain   alendronate (FOSAMAX) 70 mg tablet Take one tablet by mouth every 7 days. Take at least 30 minutes before breakfast with plain water. Do not lie down for 30 minutes.  Patient taking differently: Take one tablet by mouth every Wednesday. Take at least 30 minutes before breakfast with plain water. Do not lie down for 30 minutes.   aspirin 81 mg chewable tablet Chew one tablet by mouth daily.   ferrous sulfate 325 mg (65 mg iron) tablet Take one tablet by mouth daily.  Patient taking differently: Take one tablet by mouth at bedtime daily.   gabapentin (NEURONTIN) 300 mg capsule Take 3 caps twice a day.  Indications: neuropathic pain   MULTIVITAMIN PO Take 1 tablet by mouth daily.   pantoprazole DR (PROTONIX) 20 mg tablet Take one tablet by mouth daily.   rosuvastatin (CRESTOR) 10 mg tablet Take one tablet by mouth daily.  Patient taking differently: Take one tablet by mouth at bedtime daily.   telmisartan (MICARDIS) 20 mg tablet Take one tablet by mouth daily.         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/28/2021, no treatment)      No recent URI      No Obstructive Sleep Apnea      Cardiovascular       Recent diagnostic studies:  ECG, echocardiogram and stress test      Exercise tolerance: >4 METS (able to complete activitites w/o sx of CP; activities limited by back pain)      Hypertension, well controlled          Valvular problems/murmurs (Mild aortic valve regurgitation on ECHO 10/01/2019):AI          No past MI:      Coronary artery disease (moderate coronary artery calcification on CT Chest 04/28/2021)      No palpitations      No dysrhythmias    No angina      No indications/hx of CHF      Hyperlipidemia      No orthopnea      No dyspnea on exertion      No syncope      Follows w/ cardiology at [place], last evaluated on 09/18/2020, felt that her cardiovascular status is stable and plan for follow up scheduled on 10/08/2021. Pt denies any new cardiac sx or changes since her last cards OV.       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, feet)        Psychiatric history          Anxiety      Multiple sclerosis in remission, no medications      Musculoskeletal         Back pain      Arthritis:  osteo      No fractures        Endocrine/Other       No diabetes        Anemia (on PO iron supplement)      History of blood transfusion (2019)        Malignancy (breast cancer back in 2017 s/p chemoradiation therapy):    treated      Obesity      Constitution - negative   Physical Exam    Airway Findings      Mallampati: II      TM distance: <3 FB      Neck ROM: full      Mouth opening: good    Dental Findings: Negative            Cardiovascular Findings:       Rhythm: regular      Rate: normal      No murmur, no peripheral edema    Pulmonary Findings:       Breath sounds clear to auscultation.    Abdominal Findings:       Obese      Abdominal exam deferred    Neurological Findings:       Alert and oriented x 3    Constitutional findings:       No acute distress       Diagnostic Tests  Hematology:   Lab Results   Component Value Date    HGB 13.6 01/26/2021    HCT 40.5 01/26/2021    PLTCT 218 01/26/2021    WBC 6.1 01/26/2021    NEUT 60 01/26/2021  ANC 3.65 01/26/2021    LYMPH 38 07/28/2017    ALC 1.56 01/26/2021    MONA 12 01/26/2021    AMC 0.75 01/26/2021    EOSA 2 01/26/2021    ABC 0.02 01/26/2021    BASOPHILS 1 07/28/2017    MCV 106.5 01/26/2021    MCH 35.7 01/26/2021    MCHC 33.5 01/26/2021    MPV 7.0 01/26/2021    RDW 14.2 01/26/2021         General Chemistry:   Lab Results   Component Value Date    NA 139 09/14/2020    K 4.6 09/14/2020    CL 99 09/14/2020    CO2 28 09/14/2020    GAP 12 09/14/2020    BUN 10 09/14/2020    CR 0.94 09/14/2020    GLU 79 09/14/2020    CA 10.2 09/14/2020    ALBUMIN 4.2 09/14/2020    LACTIC 0.6 05/04/2019    OBSCA 1.20 05/04/2019    MG 2.0 05/07/2019    TOTBILI 0.5 09/14/2020    PO4 3.3 05/07/2019      Coagulation:   Lab Results   Component Value Date    PTT 30.5 05/03/2019 INR 0.9 05/03/2019     ECHO 10/01/2019  Interpretation Summary    1. Normal LV size and systolic function. LVEF 55-65%  2. Mildly dilated right ventricle with preserved systolic function.   3. Mild aortic valve regurgitation.   ?  Compared to 2017, the mild aortic valve regurgitation is new     Stress Test 10/01/2019  Left Ventricular Ejection Fraction  =? 78 %.   ?  Left Ventricular End Diastolic Volume: 54 mL  SUMMARY/OPINION:??This study is probably normal.  There is evidence of soft tissue attenuation, a significant ischemic change is not appreciated. Left ventricular systolic function is normal, the ejection fraction 78%. There are no high risk prognostic indicators present.  The pulmonary to myocardial count ratio is normal 0.31, there is no transient ischemic dilatation.  The pharmacologic ECG portion of the study is negative for ischemia.  ?  No prior studies available for comparison.    ?  In aggregate the current study is low risk in regards to predicted annual cardiovascular mortality rate.    EKG 09/18/2020  sinus rhythm with 2 isolated APCs and nonspecific ST-T wave abnormalities, which appear unchanged compared to prior tracings  (not Afib as noted on EKG read)    CT Chest 04/28/2021  1. ?Several tiny pulmonary nodules remain stable for 2 years compatible   scars or granulomas. No new or enlarging pulmonary nodules are identified.     2. ?Mild emphysema and scattered areas of scarring.     3. ?At least moderate coronary artery calcification.     4. ?Large hiatal hernia containing almost the entire stomach. ?    PLAN    PAC Plan    Interview: Clinic Interview    ASA Score: 3    Anesthesia Options Discussed: General      Prior Records Requested: (Follow prearranged cards OVN at [place] on 10/08/2021)        Labs/Studies Ordered Prior to DOS: (T&S on file from 05/03/2019 w/ no subsequent transfusions)  CBC and BMP  DOS Orders:  T&C        PAC RISK ASSESSMENTS:   *[facility] Activity Status Index (DASI): 24.2, Calculated METS: 5.72 (score < 25 correlates with increased risk for death, MI, and moderate to severe complications, max score 57)  *STOP-BANG Score:  3 (total 5-8 high risk for OSA, 3-4 with HCO3 >28 also high risk. OSA associated with greater than twice the odds for respiratory failure, cardiac events, ICU admission, and difficult intubation)

## 2021-10-08 ENCOUNTER — Encounter: Admit: 2021-10-08 | Discharge: 2021-10-08 | Payer: MEDICARE

## 2021-10-08 ENCOUNTER — Ambulatory Visit: Admit: 2021-10-08 | Discharge: 2021-10-08 | Payer: MEDICARE

## 2021-10-08 DIAGNOSIS — G629 Polyneuropathy, unspecified: Secondary | ICD-10-CM

## 2021-10-08 DIAGNOSIS — I10 Essential (primary) hypertension: Secondary | ICD-10-CM

## 2021-10-08 DIAGNOSIS — E782 Mixed hyperlipidemia: Secondary | ICD-10-CM

## 2021-10-08 DIAGNOSIS — R931 Abnormal findings on diagnostic imaging of heart and coronary circulation: Secondary | ICD-10-CM

## 2021-10-08 DIAGNOSIS — S62109A Fracture of unspecified carpal bone, unspecified wrist, initial encounter for closed fracture: Secondary | ICD-10-CM

## 2021-10-08 DIAGNOSIS — M549 Dorsalgia, unspecified: Secondary | ICD-10-CM

## 2021-10-08 DIAGNOSIS — M1990 Unspecified osteoarthritis, unspecified site: Secondary | ICD-10-CM

## 2021-10-08 DIAGNOSIS — G35 Multiple sclerosis: Secondary | ICD-10-CM

## 2021-10-08 DIAGNOSIS — Z01810 Encounter for preprocedural cardiovascular examination: Secondary | ICD-10-CM

## 2021-10-08 DIAGNOSIS — K219 Gastro-esophageal reflux disease without esophagitis: Secondary | ICD-10-CM

## 2021-10-08 DIAGNOSIS — S92902A Unspecified fracture of left foot, initial encounter for closed fracture: Secondary | ICD-10-CM

## 2021-10-08 DIAGNOSIS — Z9221 Personal history of antineoplastic chemotherapy: Secondary | ICD-10-CM

## 2021-10-08 DIAGNOSIS — Z136 Encounter for screening for cardiovascular disorders: Secondary | ICD-10-CM

## 2021-10-08 DIAGNOSIS — C801 Malignant (primary) neoplasm, unspecified: Secondary | ICD-10-CM

## 2021-10-08 DIAGNOSIS — K449 Diaphragmatic hernia without obstruction or gangrene: Secondary | ICD-10-CM

## 2021-10-08 NOTE — Patient Instructions
Follow-Up:    -Thank you for allowing Rachael Black to participate in your care today. Your After Visit Summary is being completed by [facility].    -We would like you to follow up in  1 years with [name], MD  -The schedule is released approximately 4-5 months in advance. You will be called by our scheduling department to make an appointment and you will also receive a notification via MyChart to self-schedule.  However, if you would like to call to make this appointment, please call [phone].    Contacting our office:    -For NON-URGENT questions please contact Rachael Black via message through your MyChart account.   -For all medication refills please contact your pharmacy or send a request through MyChart.     -For all questions that may need to be addressed urgently please call the RED nursing triage line at [phone] Monday - Friday 8am-5pm only. Please leave a detailed message with your name, date of birth, and reason for your call.  If your message is received before 3:30pm, every effort will be made to call you back the same day.  Please allow time for Rachael Black to review your chart prior to call back.     -Should you have an urgent concern over the weekend/nights, the on-call triage line is [phone].    -Red team fax number: [phone]    -You may receive a survey in the upcoming weeks from [facility]. Your feedback is important to Rachael Black and helps Rachael Black continue to improve patient care and patient satisfaction.     -Please feel free to call our Financial Department at [phone] with any questions or concerns about estimated cost of testing or imaging ordered today. We are happy to provide CPT codes upon request.    Results & Testing Follow Up:    -Please allow 5-7 business days for the results of any testing to be reviewed. Please call our office if you have not heard from a nurse within this time frame.    -Should you choose to complete testing at an outside facility, please contact our office after completion of testing so that we can ensure that we have received results for your provider to review.    Lab and test results:  As a part of the CARES act, starting 05/30/2019, some results will be released to you via MyChart immediately and automatically.  You may see results before your provider sees them; however, your provider will review all these results and then they, or one of their team, will notify you of result information and recommendations.   Critical results will be addressed immediately, but otherwise, please allow Rachael Black time to get back with you prior to you reaching out to Rachael Black for questions.  This will usually take about 72 hours for labs and 5-7 days for procedure test results.

## 2021-10-08 NOTE — Progress Notes
Cardiovascular Medicine       Date of Service: 10/08/2021    Rachael Black is a 76 y.o. female.       HPI     Rachael Black was seen today in the Cardiovascular Medicine Clinic at [facility] at our [place], [place] office.     Dictation on: 10/08/2021  4:42 PM by: [name] [RFERRELL]              Past Medical History  Patient Active Problem List    Diagnosis Date Noted   ? Paraesophageal hernia with gastroesophageal reflux 07/15/2021   ? Other emphysema (HCC) 05/14/2021     Mild, seen on CT chest 2023     ? Hx of breast cancer 01/26/2021   ? Aortic atherosclerosis (HCC) 05/08/2020     Seen on CT 2022.     ? Primary hypertension 12/10/2019   ? Age-related osteoporosis without current pathological fracture 12/10/2019   ? Closed fracture of left distal radius 10/15/2019   ? Rotator cuff disorder, right 09/10/2019   ? Dysphagia, pharyngoesophageal phase 07/18/2019   ? Multiple sclerosis (HCC) 06/14/2019   ? GERD (gastroesophageal reflux disease) 06/14/2019   ? HLD (hyperlipidemia) 06/14/2019   ? Menopause 06/14/2019   ? Pulmonary nodule 06/14/2019   ? Thyroid nodule 06/14/2019   ? Coronary artery disease involving native coronary artery of native heart without angina pectoris 06/14/2019     05/03/19 and 08/07/19 CT Chest @ [facility] - moderate coronary artery calcifications seen  07/06/15 - echocardiogram @ [facility] - LVEF 50-55%, mild concentric LVH, trace tricuspid regurgitation     ? Macrocytosis 06/14/2019   ? Vitamin D deficiency 06/14/2019   ? Neuropathy 06/14/2019   ? Spinal stenosis of lumbar region without neurogenic claudication 06/14/2019   ? Other idiopathic scoliosis, thoracolumbar region 06/14/2019   ? Trauma 05/03/2019   ? Pubic ramus fracture (HCC) 05/03/2019   ? Pubic ramus fracture, right, sequela 05/03/2019   ? 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 Dislocation of tarsometatarsal joint of left foot 04/17/2018   ? Iron deficiency anemia 07/30/2017   ? B12 deficiency 07/30/2017   ? GI bleed 07/28/2017   ? 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 Breast cancer, left breast (HCC) 10/03/2015     Added automatically from request for surgery [phone]     ? Anemia associated with chemotherapy 09/28/2015   ? 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 Chronic fatigue 09/28/2015   ? Well adult exam 07/13/2015   ? Anxiety 07/13/2015   ? 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 Rachael Black felt a lump in the middle of April 2017. She feels it is about the size of a quarter. She denies any nipple discharge, skin change or pain. She has not had imaging since 2015.  Ultrasound guided biopsy on 06/25/15 revealed grade 3 IDC.  Rachael Black completed neoadjuvant ddAC + Taxol on 11/30/15.  Rachael Black underwent left RSL lumpectomy/SLNB on 12/14/2015.  Final pathology revealed a complete pathologic response with 0/4 lymph nodes.  She completed radiation therapy with Dr. Janee Morn on 02/09/16.  BREAST IMAGING:  Mammogram:    -- Bilateral screening mammogram 01/02/14 [facility]) revealed scattered fibroglandular densities. There was no suspicious mass or architectural distortion.  -- Bilateral diagnostic mammogram 06/23/15 ([place]) revealed scattered fibroglandular densities. There was a dense, irregular, 2 cm mass at the patient's area of palpable concern in the upper left breast, 12:00 position, anterior to middle depth. No new suspicious abnormality was seen within the right breast on mammogram.  Ultrasound:  -- Targeted left breast ultrasound 06/23/15 ([place]) revealed at 12:00, 5 cm from the nipple, demonstrated a 2.1 x 1.6 x 1.9 cm irregular, not parallel, hypoechoic mass with angular margins and internal blood flow. There may be intraductal extension from this mass towards the nipple. No suspicious left axillary or left parasternal lymph nodes were identified.  MRI:  -- Bilateral breast MRI 07/23/15 ([place]) revealed no area of suspicious enhancement in the right breast. No adenopathy, skin or nipple abnormalities were identified. Within the left breast superiorly at approximately 12:00 there was the known malignancy measuring 1.6 cm. 3 cm posterior and inferior to the mass within the middle depth central breast slightly medial, there was a 4 mm circumscribed enhancing mass which was stable on multiple prior mammograms dating back with June 2012. No skin or nipple abnormalities were present. No axillary abnormalities were present.    REPRODUCTIVE HEALTH:  Age at first Menarche:  62  Age at First Live Birth:  19  Age at Menopause:  36, took HRT x 2 years  Gravida:  2  Para: 2  Breastfeeding:  No    PROCEDURE:   1.  Right breast cyst excision, 1990s  2.  Left RSL lumpectomy/SLNB, 12/14/2015 [name])  PERTINENT PMH: Multiple sclerosis, HTN, HLD, GERD   FAMILY HISTORY:  No family history of breast cancer  PHYSICAL EXAM on PRESENTATION:  Left - 2.5 cm lump at 12:00, 5 cm FTN. No skin or nipple changes. Right - No palpable breast masses. No skin, nipple, or areolar change. No supraclavicular or axillary adenopathy.    MEDICAL ONCOLOGY: Dr. Stasia Cavalier NEOADJUVANT THERAPY:  Neoadjuvant ddAC + Taxol 07/20/15 - 11/23/15 PRESENT THERAPY: Observation  REFERRED BY:  Dr. Merian Capron    TREATMENT HISTORY:   Dose dense AC 522/17 - 08/31/15  Weekly Taxol 09/14/15          I reviewed and confirmed this patient's problem list, active medications, allergies, and past medical, social, family & tobacco histories.       Review of Systems   Constitutional: Negative.   HENT: Negative.    Eyes: Negative.    Cardiovascular: Negative.    Respiratory: Negative.    Endocrine: Negative.    Hematologic/Lymphatic: Negative.    Skin: Negative.    Musculoskeletal: Negative.    Gastrointestinal: Negative.    Genitourinary: Negative.    Neurological: Negative.    Psychiatric/Behavioral: Negative.    Allergic/Immunologic: Negative.        Vitals:    10/08/21 1612   BP: 120/80   BP Source: Arm, Left Upper   Pulse: 84   SpO2: 96%   O2 Device: None (Room air)   PainSc: Zero   Weight: 80.4 kg (177 lb 3.2 oz)   Height: 165.1 cm (5' 5)     Body mass index is 29.49 kg/m?Marland Kitchen     Physical Exam  General Appearance: no acute distress  Skin: warm & intact  HEENT: EOMI, mucous membranes moist, oropharynx is clear  Neck Veins: difficult to assess due to body habitus  Carotid Arteries: no bruits  Chest Inspection: chest is normal in appearance  Auscultation/Percussion: lungs clear to auscultation, no rales, rhonchi, or wheezing  Cardiac Rhythm: regular rhythm & normal rate  Cardiac Auscultation: distant heart tones otherwise normal S1 & S2, no discrete extra heart sounds or rub  Murmurs: no audible cardiac murmurs   Extremities: no lower extremity edema; palpable distal pulses  Abdominal Exam: non-tender, bowel sounds+, abdominal aorta not appreciated  Liver & Spleen: difficult to assess due to body habitus  Neurologic Exam: oriented to time, place and person; no focal neurologic deficits    Cardiovascular Studies      Cardiovascular Health Factors  Vitals BP Readings from Last 3 Encounters:   10/08/21 120/80   09/21/21 126/75   08/19/21 (!) 146/85     Wt Readings from Last 3 Encounters:   10/08/21 80.4 kg (177 lb 3.2 oz)   09/21/21 80.8 kg (178 lb 3.2 oz)   08/19/21 80.9 kg (178 lb 6.4 oz)     BMI Readings from Last 3 Encounters:   10/08/21 29.49 kg/m?   09/21/21 30.59 kg/m?   08/19/21 30.62 kg/m?      Smoking Social History     Tobacco Use   Smoking Status Former   ? Packs/day: 1.00   ? Years: 10.00   ? Pack years: 10.00   ? Types: Cigarettes   ? Quit date: 02/28/2005   ? Years since quitting: 16.6   Smokeless Tobacco Never   Vaping Use   ? Vaping Use: Never used      Lipid Profile Cholesterol Date Value Ref Range Status   09/14/2020 155 <200 MG/DL Final     HDL   Date Value Ref Range Status   09/14/2020 75 >40 MG/DL Final     LDL   Date Value Ref Range Status   09/14/2020 66 <100 mg/dL Final     Triglycerides   Date Value Ref Range Status   09/14/2020 124 <150 MG/DL Final      Blood Sugar No results found for: HGBA1C  Glucose   Date Value Ref Range Status   09/21/2021 103 (H) 70 - 100 MG/DL Final   81/19/1478 79 70 - 100 MG/DL Final   29/56/2130 92 70 - 100 MG/DL Final          Assessment & Plan   76 y.o. female patient with the following medical problems:    ? CAD based on coronary artery calcification identified on CT chest.  ? Aortic valve sclerosis with mild regurgitation. No stenosis.   ? Former tobacco use.  ? Essential hypertension.   ? Hyperlipidemia.  ? Left breast cancer s/p chemoradiation therapy.   ? Peripheral neuropathy.  ? Large paraesophageal hernia.    Rachael Black was seen today for cardiovascular follow-up as described in detail above.   Dictation on: 10/08/2021  4:46 PM by: [name] [RFERRELL]         Current Medications (including today's revisions)  ? acetaminophen SR(+) (TYLENOL) 650 mg tablet Take two tablets by mouth twice daily. Two tablets in the morning and three at night as needed for pain   ? alendronate (FOSAMAX) 70 mg tablet Take one tablet by mouth every 7 days. Take at least 30 minutes before breakfast with plain water. Do not lie down for 30 minutes. (Patient taking differently: Take one tablet by mouth every Wednesday. Take at least 30 minutes before breakfast with plain water. Do not lie down for 30 minutes.)   ?  aspirin 81 mg chewable tablet Chew one tablet by mouth daily.   ? ferrous sulfate 325 mg (65 mg iron) tablet Take one tablet by mouth daily. (Patient taking differently: Take one tablet by mouth at bedtime daily.)   ? gabapentin (NEURONTIN) 300 mg capsule Take 3 caps twice a day.  Indications: neuropathic pain   ? MULTIVITAMIN PO Take 1 tablet by mouth daily.   ? pantoprazole DR (PROTONIX) 20 mg tablet Take one tablet by mouth daily.   ? rosuvastatin (CRESTOR) 10 mg tablet Take one tablet by mouth daily. (Patient taking differently: Take one tablet by mouth at bedtime daily.)   ? telmisartan (MICARDIS) 20 mg tablet Take one tablet by mouth daily.           Total time spent on today's office visit was 40 minutes.  This includes face-to-face in person visit with patient including counseling & educating as well as nonface-to-face time including preparing to see the patient, obtaining and/or reviewing separately obtained history, performing a medically appropriate examination and/or evaluation, review of the electronic medical records, labs, pertinent radiologic studies, independent interpretation of cardiovascular studies as outlined above, formulation of treatment plan, patient care coordination, after visit summary, future disposition, and documenting clinical information in the electronic health record.

## 2021-10-13 ENCOUNTER — Encounter: Admit: 2021-10-13 | Discharge: 2021-10-13 | Payer: MEDICARE

## 2021-10-13 DIAGNOSIS — M549 Dorsalgia, unspecified: Secondary | ICD-10-CM

## 2021-10-13 DIAGNOSIS — C801 Malignant (primary) neoplasm, unspecified: Secondary | ICD-10-CM

## 2021-10-13 DIAGNOSIS — Z9221 Personal history of antineoplastic chemotherapy: Secondary | ICD-10-CM

## 2021-10-13 DIAGNOSIS — S62109A Fracture of unspecified carpal bone, unspecified wrist, initial encounter for closed fracture: Secondary | ICD-10-CM

## 2021-10-13 DIAGNOSIS — K219 Gastro-esophageal reflux disease without esophagitis: Secondary | ICD-10-CM

## 2021-10-13 DIAGNOSIS — G35 Multiple sclerosis: Secondary | ICD-10-CM

## 2021-10-13 DIAGNOSIS — I10 Essential (primary) hypertension: Secondary | ICD-10-CM

## 2021-10-13 DIAGNOSIS — M1990 Unspecified osteoarthritis, unspecified site: Secondary | ICD-10-CM

## 2021-10-13 DIAGNOSIS — S92902A Unspecified fracture of left foot, initial encounter for closed fracture: Secondary | ICD-10-CM

## 2021-10-13 DIAGNOSIS — G629 Polyneuropathy, unspecified: Secondary | ICD-10-CM

## 2021-10-13 MED ORDER — PROPOFOL INJ 10 MG/ML IV VIAL
INTRAVENOUS | 0 refills | Status: DC
Start: 2021-10-13 — End: 2021-10-13
  Administered 2021-10-13: 13:00:00 150 mg via INTRAVENOUS

## 2021-10-13 MED ORDER — FENTANYL CITRATE (PF) 50 MCG/ML IJ SOLN
INTRAVENOUS | 0 refills | Status: DC
Start: 2021-10-13 — End: 2021-10-13
  Administered 2021-10-13 (×2): 25 ug via INTRAVENOUS
  Administered 2021-10-13: 13:00:00 50 ug via INTRAVENOUS

## 2021-10-13 MED ORDER — ONDANSETRON HCL (PF) 4 MG/2 ML IJ SOLN
INTRAVENOUS | 0 refills | Status: DC
Start: 2021-10-13 — End: 2021-10-13
  Administered 2021-10-13: 15:00:00 4 mg via INTRAVENOUS

## 2021-10-13 MED ORDER — EPHEDRINE SULFATE 50 MG/ML IV SOLN
INTRAVENOUS | 0 refills | Status: DC
Start: 2021-10-13 — End: 2021-10-13
  Administered 2021-10-13 (×3): 10 mg via INTRAVENOUS

## 2021-10-13 MED ORDER — DEXAMETHASONE SODIUM PHOSPHATE 4 MG/ML IJ SOLN
INTRAVENOUS | 0 refills | Status: DC
Start: 2021-10-13 — End: 2021-10-13
  Administered 2021-10-13: 13:00:00 4 mg via INTRAVENOUS

## 2021-10-13 MED ORDER — PHENYLEPHRINE HCL IN 0.9% NACL 1 MG/10 ML (100 MCG/ML) IV SYRG
INTRAVENOUS | 0 refills | Status: DC
Start: 2021-10-13 — End: 2021-10-13
  Administered 2021-10-13 (×4): 100 ug via INTRAVENOUS

## 2021-10-13 MED ORDER — LIDOCAINE (PF) 200 MG/10 ML (2 %) IJ SYRG
INTRAVENOUS | 0 refills | Status: DC
Start: 2021-10-13 — End: 2021-10-13
  Administered 2021-10-13: 13:00:00 80 mg via INTRAVENOUS

## 2021-10-13 MED ORDER — ARTIFICIAL TEARS (PF) SINGLE DOSE DROPS GROUP
OPHTHALMIC | 0 refills | Status: DC
Start: 2021-10-13 — End: 2021-10-13
  Administered 2021-10-13: 13:00:00 2 [drp] via OPHTHALMIC

## 2021-10-13 MED ORDER — ROCURONIUM 10 MG/ML IV SOLN
INTRAVENOUS | 0 refills | Status: DC
Start: 2021-10-13 — End: 2021-10-13
  Administered 2021-10-13: 13:00:00 50 mg via INTRAVENOUS

## 2021-10-13 MED ORDER — SUGAMMADEX 100 MG/ML IV SOLN
INTRAVENOUS | 0 refills | Status: DC
Start: 2021-10-13 — End: 2021-10-13
  Administered 2021-10-13: 15:00:00 160 mg via INTRAVENOUS

## 2021-10-13 MED ORDER — SUCCINYLCHOLINE CHLORIDE 20 MG/ML IJ SOLN
INTRAVENOUS | 0 refills | Status: DC
Start: 2021-10-13 — End: 2021-10-13
  Administered 2021-10-13: 13:00:00 100 mg via INTRAVENOUS

## 2021-10-13 MED ADMIN — SODIUM CHLORIDE 0.9 % IV SOLP [27838]: 1000.000 mL | INTRAVENOUS | Stop: 2021-10-15 | NDC 00338004904

## 2021-10-13 MED ADMIN — SODIUM CHLORIDE 0.9 % IV SOLP [27838]: 1000.000 mL | INTRAVENOUS | @ 17:00:00 | Stop: 2021-10-15 | NDC 00338004904

## 2021-10-13 MED ADMIN — FENTANYL CITRATE (PF) 50 MCG/ML IJ SOLN [3037]: 25 ug | INTRAVENOUS | @ 15:00:00 | Stop: 2021-10-13 | NDC 00409909412

## 2021-10-13 MED ADMIN — OXYCODONE 5 MG/5 ML PO SOLN [10813]: 5 mg | ORAL | @ 15:00:00 | Stop: 2021-10-13 | NDC 00904682805

## 2021-10-13 MED ADMIN — CEFAZOLIN INJ 1GM IVP [210319]: 2 g | INTRAVENOUS | @ 13:00:00 | Stop: 2021-10-13 | NDC 00143992490

## 2021-10-13 MED ADMIN — HYDROMORPHONE (PF) 2 MG/ML IJ SYRG [163476]: 0.5 mg | INTRAVENOUS | @ 16:00:00 | Stop: 2021-10-13 | NDC 00409131203

## 2021-10-13 MED ADMIN — LACTATED RINGERS IV SOLP [4318]: 1000.000 mL | INTRAVENOUS | @ 14:00:00 | Stop: 2021-10-15 | NDC 00338011704

## 2021-10-13 MED ADMIN — HYOSCYAMINE SULFATE 0.125 MG PO TBDI [29822]: 0.125 mg | SUBLINGUAL | Stop: 2021-10-14 | NDC 47781001201

## 2021-10-13 MED ADMIN — ACETAMINOPHEN 1,000 MG/100 ML (10 MG/ML) IV SOLN [305632]: 1000 mg | INTRAVENOUS | @ 16:00:00 | Stop: 2021-10-13 | NDC 24201010001

## 2021-10-13 MED ADMIN — LACTATED RINGERS IV SOLP [4318]: 1000 mL | INTRAVENOUS | @ 12:00:00 | Stop: 2021-10-15 | NDC 00338011704

## 2021-10-13 MED ADMIN — FAMOTIDINE (PF) 20 MG/2 ML IV SOLN [166077]: 20 mg | INTRAVENOUS | @ 12:00:00 | Stop: 2021-10-13 | NDC 67457043300

## 2021-10-13 MED ADMIN — BUPIVACAINE HCL 0.25 % (2.5 MG/ML) IJ SOLN [1222]: 20 mL | INTRAMUSCULAR | @ 13:00:00 | Stop: 2021-10-13 | NDC 63323046501

## 2021-10-13 MED ADMIN — ACETAMINOPHEN 1,000 MG/100 ML (10 MG/ML) IV SOLN [305632]: 1000 mg | INTRAVENOUS | @ 21:00:00 | Stop: 2021-10-13 | NDC 24201010001

## 2021-10-14 ENCOUNTER — Encounter: Admit: 2021-10-14 | Discharge: 2021-10-14 | Payer: MEDICARE

## 2021-10-14 MED ADMIN — ACETAMINOPHEN 1,000 MG/100 ML (10 MG/ML) IV SOLN [305632]: 1000 mg | INTRAVENOUS | @ 05:00:00 | Stop: 2021-10-14 | NDC 24201010001

## 2021-10-14 MED ADMIN — ROSUVASTATIN 20 MG PO TAB [88504]: 10 mg | ORAL | @ 01:00:00 | NDC 68462026390

## 2021-10-14 MED ADMIN — FAMOTIDINE (PF) 20 MG/2 ML IV SOLN [166077]: 20 mg | INTRAVENOUS | @ 01:00:00 | NDC 67457043300

## 2021-10-14 MED ADMIN — HYOSCYAMINE SULFATE 0.125 MG PO TBDI [29822]: 0.125 mg | SUBLINGUAL | @ 10:00:00 | Stop: 2021-10-14 | NDC 47781001201

## 2021-10-14 MED ADMIN — OXYCODONE 5 MG PO TAB [10814]: 5 mg | ORAL | @ 17:00:00 | Stop: 2021-10-14 | NDC 00406055223

## 2021-10-14 MED ADMIN — FAMOTIDINE (PF) 20 MG/2 ML IV SOLN [166077]: 20 mg | INTRAVENOUS | @ 13:00:00 | Stop: 2021-10-14 | NDC 67457043300

## 2021-10-14 MED ADMIN — ENOXAPARIN 40 MG/0.4 ML SC SYRG [85052]: 40 mg | SUBCUTANEOUS | @ 04:00:00 | NDC 00781324602

## 2021-10-14 MED ADMIN — GABAPENTIN 300 MG PO CAP [18308]: 300 mg | ORAL | @ 01:00:00 | NDC 67877022305

## 2021-10-14 MED ADMIN — ACETAMINOPHEN 160 MG/5 ML PO SOLN [100]: 650 mg | ORAL | @ 13:00:00 | Stop: 2021-10-14 | NDC 00121197121

## 2021-10-14 MED ADMIN — OXYCODONE 10 MG PO TAB [166908]: 10 mg | ORAL | @ 01:00:00 | NDC 68084096811

## 2021-10-14 MED ADMIN — GABAPENTIN 300 MG PO CAP [18308]: 300 mg | ORAL | @ 13:00:00 | Stop: 2021-10-14 | NDC 67877022305

## 2021-10-14 MED ADMIN — HYOSCYAMINE SULFATE 0.125 MG PO TBDI [29822]: 0.125 mg | SUBLINGUAL | @ 04:00:00 | Stop: 2021-10-14 | NDC 47781001201

## 2021-10-15 ENCOUNTER — Encounter: Admit: 2021-10-15 | Discharge: 2021-10-15 | Payer: MEDICARE

## 2021-10-15 DIAGNOSIS — M549 Dorsalgia, unspecified: Secondary | ICD-10-CM

## 2021-10-15 DIAGNOSIS — C801 Malignant (primary) neoplasm, unspecified: Secondary | ICD-10-CM

## 2021-10-15 DIAGNOSIS — K219 Gastro-esophageal reflux disease without esophagitis: Secondary | ICD-10-CM

## 2021-10-15 DIAGNOSIS — I10 Essential (primary) hypertension: Secondary | ICD-10-CM

## 2021-10-15 DIAGNOSIS — G629 Polyneuropathy, unspecified: Secondary | ICD-10-CM

## 2021-10-15 DIAGNOSIS — G35 Multiple sclerosis: Secondary | ICD-10-CM

## 2021-10-15 DIAGNOSIS — S92902A Unspecified fracture of left foot, initial encounter for closed fracture: Secondary | ICD-10-CM

## 2021-10-15 DIAGNOSIS — M1990 Unspecified osteoarthritis, unspecified site: Secondary | ICD-10-CM

## 2021-10-15 DIAGNOSIS — S62109A Fracture of unspecified carpal bone, unspecified wrist, initial encounter for closed fracture: Secondary | ICD-10-CM

## 2021-10-15 DIAGNOSIS — Z9221 Personal history of antineoplastic chemotherapy: Secondary | ICD-10-CM

## 2021-10-22 ENCOUNTER — Encounter: Admit: 2021-10-22 | Discharge: 2021-10-22 | Payer: MEDICARE

## 2021-10-22 ENCOUNTER — Ambulatory Visit: Admit: 2021-10-22 | Discharge: 2021-10-23 | Payer: MEDICARE

## 2021-10-22 DIAGNOSIS — K219 Gastro-esophageal reflux disease without esophagitis: Secondary | ICD-10-CM

## 2021-10-22 DIAGNOSIS — M549 Dorsalgia, unspecified: Secondary | ICD-10-CM

## 2021-10-22 DIAGNOSIS — K449 Diaphragmatic hernia without obstruction or gangrene: Secondary | ICD-10-CM

## 2021-10-22 DIAGNOSIS — S62109A Fracture of unspecified carpal bone, unspecified wrist, initial encounter for closed fracture: Secondary | ICD-10-CM

## 2021-10-22 DIAGNOSIS — C801 Malignant (primary) neoplasm, unspecified: Secondary | ICD-10-CM

## 2021-10-22 DIAGNOSIS — G35 Multiple sclerosis: Secondary | ICD-10-CM

## 2021-10-22 DIAGNOSIS — G629 Polyneuropathy, unspecified: Secondary | ICD-10-CM

## 2021-10-22 DIAGNOSIS — M1990 Unspecified osteoarthritis, unspecified site: Secondary | ICD-10-CM

## 2021-10-22 DIAGNOSIS — I10 Essential (primary) hypertension: Secondary | ICD-10-CM

## 2021-10-22 DIAGNOSIS — Z9221 Personal history of antineoplastic chemotherapy: Secondary | ICD-10-CM

## 2021-10-22 DIAGNOSIS — S92902A Unspecified fracture of left foot, initial encounter for closed fracture: Secondary | ICD-10-CM

## 2021-10-22 NOTE — Progress Notes
CC: Initial post-op appointment status post L-PEH repair with mesh, Nissen fundoplication    Subjective:   Rachael Black is a 76 y.o. status post above.  Patient is tolerating up to Stage 2 diet without issue.  She reports she has had some left back/shoulder pain which started right after surgery but has greatly improved over the last few days.  She denies any SOB, cough, fever.     PE:   Vitals:    10/22/21 1436   BP: 125/75   BP Source: Arm, Right Upper   Pulse: 81   Temp: 36.2 ?C (97.2 ?F)   SpO2: 96%   TempSrc: Temporal   Weight: 77.6 kg (171 lb)   Height: 162.6 cm (5' 4)     Body mass index is 29.35 kg/m?Marland Kitchen      Abdomen - soft, nondistended, incision sites c/d/i, no evidence of hernia    PATHOLOGY REPORT   Date Value Ref Range Status   07/04/2019   Final        [facility]  [email or web address]    Department of Pathology and Laboratory Medicine  [address] [place], [place] [address]  Surgical Pathology Office:  [phone]  Fax:  [phone]    CYTOLOGY REPORT    NAME: [name], [name] PATH #: [id number] MR #: [id number] ALT ID #:   BILLING #: [id number]   LOCATION: [facility] DATE OF PROCEDURE: 07/04/2019 AGE:   73 SEX: F DATE RECEIVED: 07/04/2019 DOB: 03-29-1945  TIME RECEIVED:  16:11  PHYSICIAN: TSUE,TERANCE   ENT DATE OF REPORT: 07/05/2019 COPY TO:  DATE OF  PRINTING: 07/05/2019     Material Received:  A: FNA Thyroid-Left       History:  76 year old female with a thyroid nodule      Gross Description:  (1 DQ direct smear, 1 Pap direct smear,1 ThinPrep) Rapid determination of  adequacy was performed by the cytotechnologist, JZ, on Diff-Quik stained  slide(s).  The first pass was adequate for evaluation.  Pass two was  placed entirely into Cytolyt.    Adequacy assessment was performed on,07/03/2113:15 , at [facility],  [address], [address], [place], [place] [address].                      ########################################################################  Final Diagnosis:  A. Thyroid, Left, US-FNA:    Benign  Consistent with a benign follicular nodule (adenomatoid nodule, colloid  nodule, nodular goiter)       Attestation:  By this signature, I attest that I have personally formulated the final  interpretation expressed in this report and that the above diagnosis is  based upon my examination of the slides and/or other material indicated in  this report.      +++Electronically Signed Out By+++              djm/07/05/2019                                                                Interpreted by: Catheryn Bacon, MD     ]    Body mass index is 29.35 kg/m?.      A/P:  76 y.o. female s/p L-PEH repair with mesh, Nissen fundoplication  Problem List  GASTROINTESTINAL AND ABDOMINAL    Paraesophageal hernia with gastroesophageal reflux             We reviewed the post-operative diet.  She is doing well and will continue to follow the post-operativediet progression.  We discussed adequate fluid and protein intake.    Patient will slowly increase her activity.    She will follow back up in 5-6 weeks for update on oral intake.

## 2021-10-22 NOTE — Patient Instructions
NISSEN FUNDOPLICATION/HIATAL HERNIA REPAIR POSTOP DIET    Laparoscopic Nissen fundoplication and possible hiatal hernia repair is a surgery for patients suffering from gastroesophageal reflux disease, also known as GERD.  After this procedure is performed, it is necessary to go on this postoperative diet.  Carefully following this diet can minimize pain and maximize the chance of the surgeries success.    WEEK ONE    CLEAR AND FULL LIQUIDS.  Some examples of CLEAR LIQUIDS are water, Jell-O, broth, Crystal light, popsicles, lemonade, apple juice, cranberry juice, grape juice and decaffeinated tea or coffee.  Some examples of FULL LIQUIDS are pudding, cream soups, protein shakes.    NOT ADVISED:  Caffeinated beverages, carbonated drinks, thick liquids, solids, gum, alcohol and hard candy.    WEEK TWO    You may add pured foods to your diet.  Foods that you can pure in a blender include fruit without pulp or seeds, pasta, mashed potatoes, vegetables, meat and oatmeal.  Ice cream, baby food, milk shakes and instant breakfast drinks are also acceptable diet choices.    WEEK THREE    Increased to a soft food diet.  Some recommended soft foods or meat with gravy, soft cooked scrambled eggs, hot cereals, cold cereal softened milk, soft cheeses, sushi and soft cut up pasta.    NOT ADVISED:  Raw vegetables, steak, chips, tacos, bread, sandwiches, bread and hamburgers.    WEEK FOUR    Increase diet as tolerated but, be sure to take small bites and chew well.      While on this diet, it is best to eat 4-6 meals a day.  Eating slowly and exercising lightly aids indigestion and eases discomfort.  Another good rule of thumb is to stay in an upright position while eating or drinking, and remain that way 20 minutes after you are done.  If pain or discomfort persists, please contact your physician.

## 2021-10-25 ENCOUNTER — Encounter: Admit: 2021-10-25 | Discharge: 2021-10-25 | Payer: MEDICARE

## 2021-10-25 MED ORDER — TELMISARTAN 20 MG PO TAB
20 mg | ORAL_TABLET | Freq: Every day | ORAL | 3 refills | 90.00000 days | Status: AC
Start: 2021-10-25 — End: ?

## 2021-10-26 ENCOUNTER — Encounter: Admit: 2021-10-26 | Discharge: 2021-10-26 | Payer: MEDICARE

## 2021-10-26 ENCOUNTER — Ambulatory Visit: Admit: 2021-10-26 | Discharge: 2021-10-26 | Payer: MEDICARE

## 2021-10-26 DIAGNOSIS — E041 Nontoxic single thyroid nodule: Secondary | ICD-10-CM

## 2021-10-26 DIAGNOSIS — R1314 Dysphagia, pharyngoesophageal phase: Secondary | ICD-10-CM

## 2021-11-02 ENCOUNTER — Encounter: Admit: 2021-11-02 | Discharge: 2021-11-02 | Payer: MEDICARE

## 2021-11-02 ENCOUNTER — Ambulatory Visit: Admit: 2021-11-02 | Discharge: 2021-11-02 | Payer: MEDICARE

## 2021-11-02 DIAGNOSIS — M1990 Unspecified osteoarthritis, unspecified site: Secondary | ICD-10-CM

## 2021-11-02 DIAGNOSIS — K219 Gastro-esophageal reflux disease without esophagitis: Secondary | ICD-10-CM

## 2021-11-02 DIAGNOSIS — G35 Multiple sclerosis: Secondary | ICD-10-CM

## 2021-11-02 DIAGNOSIS — M549 Dorsalgia, unspecified: Secondary | ICD-10-CM

## 2021-11-02 DIAGNOSIS — S62109A Fracture of unspecified carpal bone, unspecified wrist, initial encounter for closed fracture: Secondary | ICD-10-CM

## 2021-11-02 DIAGNOSIS — C801 Malignant (primary) neoplasm, unspecified: Secondary | ICD-10-CM

## 2021-11-02 DIAGNOSIS — G629 Polyneuropathy, unspecified: Secondary | ICD-10-CM

## 2021-11-02 DIAGNOSIS — S92902A Unspecified fracture of left foot, initial encounter for closed fracture: Secondary | ICD-10-CM

## 2021-11-02 DIAGNOSIS — E041 Nontoxic single thyroid nodule: Secondary | ICD-10-CM

## 2021-11-02 DIAGNOSIS — Z9221 Personal history of antineoplastic chemotherapy: Secondary | ICD-10-CM

## 2021-11-02 DIAGNOSIS — R718 Other abnormality of red blood cells: Secondary | ICD-10-CM

## 2021-11-02 DIAGNOSIS — I10 Essential (primary) hypertension: Secondary | ICD-10-CM

## 2021-11-02 LAB — COMPREHENSIVE METABOLIC PANEL
BLD UREA NITROGEN: 13 mg/dL (ref 7–25)
CALCIUM: 9.3 mg/dL (ref 8.5–10.6)
CREATININE: 0.6 mg/dL (ref 0.4–1.00)
GLUCOSE,PANEL: 133 mg/dL — ABNORMAL HIGH (ref 70–100)
POTASSIUM: 4.2 MMOL/L (ref 3.5–5.1)
SODIUM: 139 MMOL/L (ref 137–147)
TOTAL BILIRUBIN: 0.4 mg/dL (ref 0.3–1.2)
TOTAL PROTEIN: 6.3 g/dL (ref 6.0–8.0)

## 2021-11-02 LAB — CBC AND DIFF
HEMATOCRIT: 42 % (ref 36–45)
MCV: 102 FL — ABNORMAL HIGH (ref 80–100)
MPV: 6.6 FL — ABNORMAL LOW (ref 7–11)
RBC COUNT: 4.1 M/UL (ref 4.0–5.0)
RDW: 13 % (ref 11–15)
WBC COUNT: 7.8 K/UL — ABNORMAL LOW (ref 4.5–11.0)

## 2021-11-02 LAB — TSH WITH FREE T4 REFLEX: TSH: 1.1 uU/mL (ref 0.35–5.00)

## 2021-11-08 ENCOUNTER — Encounter: Admit: 2021-11-08 | Discharge: 2021-11-08 | Payer: MEDICARE

## 2021-11-08 DIAGNOSIS — D7589 Other specified diseases of blood and blood-forming organs: Secondary | ICD-10-CM

## 2021-11-16 ENCOUNTER — Encounter: Admit: 2021-11-16 | Discharge: 2021-11-16 | Payer: MEDICARE

## 2021-11-16 ENCOUNTER — Ambulatory Visit: Admit: 2021-11-16 | Discharge: 2021-11-16 | Payer: MEDICARE

## 2021-11-16 DIAGNOSIS — G35 Multiple sclerosis: Secondary | ICD-10-CM

## 2021-11-16 DIAGNOSIS — M48061 Spinal stenosis, lumbar region without neurogenic claudication: Secondary | ICD-10-CM

## 2021-11-16 DIAGNOSIS — K219 Gastro-esophageal reflux disease without esophagitis: Secondary | ICD-10-CM

## 2021-11-16 DIAGNOSIS — M549 Dorsalgia, unspecified: Secondary | ICD-10-CM

## 2021-11-16 DIAGNOSIS — S92902A Unspecified fracture of left foot, initial encounter for closed fracture: Secondary | ICD-10-CM

## 2021-11-16 DIAGNOSIS — G629 Polyneuropathy, unspecified: Secondary | ICD-10-CM

## 2021-11-16 DIAGNOSIS — M7918 Myalgia, other site: Secondary | ICD-10-CM

## 2021-11-16 DIAGNOSIS — C801 Malignant (primary) neoplasm, unspecified: Secondary | ICD-10-CM

## 2021-11-16 DIAGNOSIS — S62109A Fracture of unspecified carpal bone, unspecified wrist, initial encounter for closed fracture: Secondary | ICD-10-CM

## 2021-11-16 DIAGNOSIS — Z9221 Personal history of antineoplastic chemotherapy: Secondary | ICD-10-CM

## 2021-11-16 DIAGNOSIS — M1990 Unspecified osteoarthritis, unspecified site: Secondary | ICD-10-CM

## 2021-11-16 DIAGNOSIS — I10 Essential (primary) hypertension: Secondary | ICD-10-CM

## 2021-11-16 MED ORDER — BUPIVACAINE (PF) 0.25 % (2.5 MG/ML) IJ SOLN
9 mL | Freq: Once | INTRAMUSCULAR | 0 refills | Status: CP | PRN
Start: 2021-11-16 — End: ?
  Administered 2021-11-16: 19:00:00 9 mL via INTRAMUSCULAR

## 2021-11-16 MED ORDER — TRIAMCINOLONE ACETONIDE 40 MG/ML IJ SUSP
40 mg | Freq: Once | INTRAMUSCULAR | 0 refills | Status: CP | PRN
Start: 2021-11-16 — End: ?
  Administered 2021-11-16: 19:00:00 40 mg via INTRAMUSCULAR

## 2021-11-16 NOTE — Telephone Encounter
PT REPORTS MORE THAN 50% RELIEF FOR OVER 6 WEEKS FORM PRIOR TPI INJECTIONS. THEY WOULD LIKE TO REPEAT THE INJECTIONS AS RECC BY THE DOCTOR.

## 2021-11-16 NOTE — Procedures
Attending Surgeon: Lizbeth Bark, MD    Anesthesia: Local    Pre-Procedure Diagnosis:   1. Myalgia, other site        Post-Procedure Diagnosis:   1. Myalgia, other site        Pain Score: Four    [facility] AMB SPINE TRIGGER POINT INJECTION  Locations: L upper trapezius, L cervical/thoracic/lumbar paraspinal, L rhomboid, R rhomboid and L gluteus maximus    Consent:   Consent obtained: verbal and written  Consent given by: patient  Alternatives discussed: alternative treatment  Discussed with patient the purpose of the treatment/procedure, other ways of treating my condition, including no treatment/ procedure and the risks and benefits of the alternatives. Patient has decided to proceed with treatment/procedure.        Universal Protocol:  Relevant documents: relevant documents present and verified  Test results: test results available and properly labeled  Imaging studies: imaging studies available  Required items: required blood products, implants, devices, and special equipment available  Site marked: the operative site was marked  Patient identity confirmed: Patient identify confirmed verbally with patient.        Time out: Immediately prior to procedure a time out was called to verify the correct patient, procedure, equipment, support staff and site/side marked as required      Procedures Details:   Indications: cervicalgia and myalgia  Prep: alcohol  Needle size: 27 G  Number of muscles: 3 or more    Approach: posterior  Medications administered: 9 mL bupivacaine PF 0.25 %; 40 mg triamcinolone acetonide 40 mg/mL  Patient tolerance: Patient tolerated the procedure well with no immediate complications. Pressure was applied, and hemostasis was accomplished.  Comments: After verbal and written consent were provided, the patient was provided with 10 trigger point injections to the left paraspinals, left trapezius, left rhomboids. 10 sites were identified and marked at the area of maximal tenderness. The patient's injections were done in the usual sterile fashion with alcohol used to clean the injection sites prior to injection. A 10 ml solution containing 40 mg triamcinolone and 9 mL of 0.25% bupivicaine was used with each site receiving 1ml of the solution. Patient tolerated the procedure well. There was no blood loss. Area was cleaned and bandaid applied. Patient was advised to avoid applying heat over injection sites and avoid soaking in bath tubs, hot tubs, or swimming pools for 3-4 days.             Estimated blood loss: none or minimal  Specimens: none  Patient tolerated the procedure well with no immediate complications. Pressure was applied, and hemostasis was accomplished.

## 2021-11-23 ENCOUNTER — Encounter: Admit: 2021-11-23 | Discharge: 2021-11-23 | Payer: MEDICARE

## 2021-11-23 ENCOUNTER — Ambulatory Visit: Admit: 2021-11-23 | Discharge: 2021-11-24 | Payer: MEDICARE

## 2021-11-23 DIAGNOSIS — M549 Dorsalgia, unspecified: Secondary | ICD-10-CM

## 2021-11-23 DIAGNOSIS — S92902A Unspecified fracture of left foot, initial encounter for closed fracture: Secondary | ICD-10-CM

## 2021-11-23 DIAGNOSIS — G35 Multiple sclerosis: Secondary | ICD-10-CM

## 2021-11-23 DIAGNOSIS — I10 Essential (primary) hypertension: Secondary | ICD-10-CM

## 2021-11-23 DIAGNOSIS — M1990 Unspecified osteoarthritis, unspecified site: Secondary | ICD-10-CM

## 2021-11-23 DIAGNOSIS — S62109A Fracture of unspecified carpal bone, unspecified wrist, initial encounter for closed fracture: Secondary | ICD-10-CM

## 2021-11-23 DIAGNOSIS — Z9221 Personal history of antineoplastic chemotherapy: Secondary | ICD-10-CM

## 2021-11-23 DIAGNOSIS — K449 Diaphragmatic hernia without obstruction or gangrene: Secondary | ICD-10-CM

## 2021-11-23 DIAGNOSIS — K219 Gastro-esophageal reflux disease without esophagitis: Secondary | ICD-10-CM

## 2021-11-23 DIAGNOSIS — G629 Polyneuropathy, unspecified: Secondary | ICD-10-CM

## 2021-11-23 DIAGNOSIS — C801 Malignant (primary) neoplasm, unspecified: Secondary | ICD-10-CM

## 2021-11-23 NOTE — Progress Notes
CC: Status post L-PEHR with mesh, Nissen fundoplication    Subjective:   Rachael Black is a 76 y.o. yo status post above completed 10/13/2021.  She reports she is continuing to advance her diet slowly.  She is able to tolerate ground meats but still finds steak to be more difficult.  She denies food gets stuck but states her stomach doesn't feel good with certain foods.  No reflux.  No emesis.  She has had some issues with constipation but has been taking a stool softener.  She is also working on increasing her fluid intake.     PE:   Vitals:    11/23/21 1008   Height: 162.6 cm ('5\' 4"'$ )       Abdomen - soft, nondistended, incision sites c/d/i, no evidence of hernia    A/P:  76 y.o. female s/p L-PEHR with mesh, nissen fundoplication     Patient will continue to introduce new foods slowly.  She understands this may take several months for her stomach to tolerate certain foods.  She is making good progress.      We also discussed continuing stool softeners and adding a fiber supplement to help with constipation.  I reinforced that she will need to increase her fluid intake so that the stool softeners and fiber will able to work.    At this point, patient will follow up as needed.

## 2021-12-07 ENCOUNTER — Encounter: Admit: 2021-12-07 | Discharge: 2021-12-07 | Payer: MEDICARE

## 2022-01-26 ENCOUNTER — Ambulatory Visit: Admit: 2022-01-26 | Discharge: 2022-01-27 | Payer: MEDICARE

## 2022-01-26 ENCOUNTER — Encounter: Admit: 2022-01-26 | Discharge: 2022-01-26 | Payer: MEDICARE

## 2022-01-26 DIAGNOSIS — E041 Nontoxic single thyroid nodule: Secondary | ICD-10-CM

## 2022-01-26 DIAGNOSIS — M1990 Unspecified osteoarthritis, unspecified site: Secondary | ICD-10-CM

## 2022-01-26 DIAGNOSIS — M549 Dorsalgia, unspecified: Secondary | ICD-10-CM

## 2022-01-26 DIAGNOSIS — C801 Malignant (primary) neoplasm, unspecified: Secondary | ICD-10-CM

## 2022-01-26 DIAGNOSIS — K219 Gastro-esophageal reflux disease without esophagitis: Secondary | ICD-10-CM

## 2022-01-26 DIAGNOSIS — G35 Multiple sclerosis: Secondary | ICD-10-CM

## 2022-01-26 DIAGNOSIS — Z0000 Encounter for general adult medical examination without abnormal findings: Secondary | ICD-10-CM

## 2022-01-26 DIAGNOSIS — G620 Drug-induced polyneuropathy: Secondary | ICD-10-CM

## 2022-01-26 DIAGNOSIS — S62109A Fracture of unspecified carpal bone, unspecified wrist, initial encounter for closed fracture: Secondary | ICD-10-CM

## 2022-01-26 DIAGNOSIS — S92902A Unspecified fracture of left foot, initial encounter for closed fracture: Secondary | ICD-10-CM

## 2022-01-26 DIAGNOSIS — I2510 Atherosclerotic heart disease of native coronary artery without angina pectoris: Secondary | ICD-10-CM

## 2022-01-26 DIAGNOSIS — E538 Deficiency of other specified B group vitamins: Secondary | ICD-10-CM

## 2022-01-26 DIAGNOSIS — I10 Essential (primary) hypertension: Secondary | ICD-10-CM

## 2022-01-26 DIAGNOSIS — G629 Polyneuropathy, unspecified: Secondary | ICD-10-CM

## 2022-01-26 DIAGNOSIS — Z853 Personal history of malignant neoplasm of breast: Secondary | ICD-10-CM

## 2022-01-26 DIAGNOSIS — M48061 Spinal stenosis, lumbar region without neurogenic claudication: Secondary | ICD-10-CM

## 2022-01-26 DIAGNOSIS — I700 Atherosclerosis of aorta: Secondary | ICD-10-CM

## 2022-01-26 DIAGNOSIS — E782 Mixed hyperlipidemia: Secondary | ICD-10-CM

## 2022-01-26 DIAGNOSIS — Z9221 Personal history of antineoplastic chemotherapy: Secondary | ICD-10-CM

## 2022-01-26 MED ORDER — PANTOPRAZOLE 20 MG PO TBEC
20 mg | ORAL_TABLET | Freq: Every day | ORAL | 3 refills | 90.00000 days | Status: AC
Start: 2022-01-26 — End: ?

## 2022-01-26 MED ORDER — ALENDRONATE 70 MG PO TAB
70 mg | ORAL_TABLET | ORAL | 3 refills | 84.00000 days | Status: AC
Start: 2022-01-26 — End: ?

## 2022-01-26 MED ORDER — GABAPENTIN 300 MG PO CAP
ORAL_CAPSULE | 3 refills | Status: AC
Start: 2022-01-26 — End: ?

## 2022-01-26 MED ORDER — LIDOCAINE 5 % TP PTMD
1 | MEDICATED_PATCH | Freq: Every day | TOPICAL | 3 refills | 30.00000 days | Status: AC
Start: 2022-01-26 — End: ?

## 2022-01-26 MED ORDER — ROSUVASTATIN 10 MG PO TAB
10 mg | ORAL_TABLET | Freq: Every evening | ORAL | 3 refills | 90.00000 days | Status: AC
Start: 2022-01-26 — End: ?

## 2022-01-26 MED ORDER — TELMISARTAN 20 MG PO TAB
20 mg | ORAL_TABLET | Freq: Every day | ORAL | 3 refills | 90.00000 days | Status: AC
Start: 2022-01-26 — End: ?

## 2022-01-26 NOTE — Patient Instructions
Vitamin D 1,000IU daily.

## 2022-01-27 DIAGNOSIS — E559 Vitamin D deficiency, unspecified: Secondary | ICD-10-CM

## 2022-01-27 DIAGNOSIS — M810 Age-related osteoporosis without current pathological fracture: Secondary | ICD-10-CM

## 2022-01-27 DIAGNOSIS — G629 Polyneuropathy, unspecified: Secondary | ICD-10-CM

## 2022-01-27 DIAGNOSIS — Z23 Encounter for immunization: Secondary | ICD-10-CM

## 2022-01-27 DIAGNOSIS — I10 Essential (primary) hypertension: Secondary | ICD-10-CM

## 2022-01-28 ENCOUNTER — Encounter: Admit: 2022-01-28 | Discharge: 2022-01-28 | Payer: MEDICARE

## 2022-01-28 ENCOUNTER — Ambulatory Visit: Admit: 2022-01-28 | Discharge: 2022-01-28 | Payer: MEDICARE

## 2022-01-28 DIAGNOSIS — M549 Dorsalgia, unspecified: Secondary | ICD-10-CM

## 2022-01-28 DIAGNOSIS — G629 Polyneuropathy, unspecified: Secondary | ICD-10-CM

## 2022-01-28 DIAGNOSIS — K219 Gastro-esophageal reflux disease without esophagitis: Secondary | ICD-10-CM

## 2022-01-28 DIAGNOSIS — I10 Essential (primary) hypertension: Secondary | ICD-10-CM

## 2022-01-28 DIAGNOSIS — M1990 Unspecified osteoarthritis, unspecified site: Secondary | ICD-10-CM

## 2022-01-28 DIAGNOSIS — S92902A Unspecified fracture of left foot, initial encounter for closed fracture: Secondary | ICD-10-CM

## 2022-01-28 DIAGNOSIS — Z1231 Encounter for screening mammogram for malignant neoplasm of breast: Secondary | ICD-10-CM

## 2022-01-28 DIAGNOSIS — Z9221 Personal history of antineoplastic chemotherapy: Secondary | ICD-10-CM

## 2022-01-28 DIAGNOSIS — G35 Multiple sclerosis: Secondary | ICD-10-CM

## 2022-01-28 DIAGNOSIS — C801 Malignant (primary) neoplasm, unspecified: Secondary | ICD-10-CM

## 2022-01-28 DIAGNOSIS — S62109A Fracture of unspecified carpal bone, unspecified wrist, initial encounter for closed fracture: Secondary | ICD-10-CM

## 2022-03-02 ENCOUNTER — Ambulatory Visit: Admit: 2022-03-02 | Discharge: 2022-03-02 | Payer: MEDICARE

## 2022-03-02 ENCOUNTER — Encounter: Admit: 2022-03-02 | Discharge: 2022-03-02 | Payer: MEDICARE

## 2022-03-02 DIAGNOSIS — E538 Deficiency of other specified B group vitamins: Secondary | ICD-10-CM

## 2022-03-02 DIAGNOSIS — E559 Vitamin D deficiency, unspecified: Secondary | ICD-10-CM

## 2022-03-02 DIAGNOSIS — M810 Age-related osteoporosis without current pathological fracture: Secondary | ICD-10-CM

## 2022-03-02 DIAGNOSIS — D7589 Other specified diseases of blood and blood-forming organs: Secondary | ICD-10-CM

## 2022-03-02 LAB — VITAMIN B12: VITAMIN B12: 142 pg/mL — ABNORMAL LOW (ref 180–914)

## 2022-03-04 ENCOUNTER — Encounter: Admit: 2022-03-04 | Discharge: 2022-03-04 | Payer: MEDICARE

## 2022-03-04 DIAGNOSIS — E559 Vitamin D deficiency, unspecified: Secondary | ICD-10-CM

## 2022-03-04 MED ORDER — CHOLECALCIFEROL (VITAMIN D3) 1,250 MCG (50,000 UNIT) PO CAP
50000 [IU] | ORAL_CAPSULE | ORAL | 0 refills | 84.00000 days | Status: AC
Start: 2022-03-04 — End: ?

## 2022-03-08 ENCOUNTER — Encounter: Admit: 2022-03-08 | Discharge: 2022-03-08 | Payer: MEDICARE

## 2022-03-08 DIAGNOSIS — E559 Vitamin D deficiency, unspecified: Secondary | ICD-10-CM

## 2022-03-08 MED ORDER — ERGOCALCIFEROL (VITAMIN D2) 1,250 MCG (50,000 UNIT) PO CAP
1 | ORAL_CAPSULE | ORAL | 0 refills | 56.00000 days | Status: AC
Start: 2022-03-08 — End: ?

## 2022-05-06 ENCOUNTER — Encounter: Admit: 2022-05-06 | Discharge: 2022-05-06 | Payer: MEDICARE

## 2022-08-31 ENCOUNTER — Encounter: Admit: 2022-08-31 | Discharge: 2022-08-31 | Payer: MEDICARE

## 2022-09-26 ENCOUNTER — Encounter: Admit: 2022-09-26 | Discharge: 2022-09-26 | Payer: MEDICARE

## 2022-10-11 ENCOUNTER — Encounter: Admit: 2022-10-11 | Discharge: 2022-10-11 | Payer: MEDICARE

## 2022-10-11 ENCOUNTER — Ambulatory Visit: Admit: 2022-10-11 | Discharge: 2022-10-11 | Payer: MEDICARE

## 2022-10-11 DIAGNOSIS — S92902A Unspecified fracture of left foot, initial encounter for closed fracture: Secondary | ICD-10-CM

## 2022-10-11 DIAGNOSIS — G35 Multiple sclerosis: Secondary | ICD-10-CM

## 2022-10-11 DIAGNOSIS — C801 Malignant (primary) neoplasm, unspecified: Secondary | ICD-10-CM

## 2022-10-11 DIAGNOSIS — K219 Gastro-esophageal reflux disease without esophagitis: Secondary | ICD-10-CM

## 2022-10-11 DIAGNOSIS — S62109A Fracture of unspecified carpal bone, unspecified wrist, initial encounter for closed fracture: Secondary | ICD-10-CM

## 2022-10-11 DIAGNOSIS — M1990 Unspecified osteoarthritis, unspecified site: Secondary | ICD-10-CM

## 2022-10-11 DIAGNOSIS — G629 Polyneuropathy, unspecified: Secondary | ICD-10-CM

## 2022-10-11 DIAGNOSIS — M549 Dorsalgia, unspecified: Secondary | ICD-10-CM

## 2022-10-11 DIAGNOSIS — Z9221 Personal history of antineoplastic chemotherapy: Secondary | ICD-10-CM

## 2022-10-11 DIAGNOSIS — R221 Localized swelling, mass and lump, neck: Secondary | ICD-10-CM

## 2022-10-11 DIAGNOSIS — I700 Atherosclerosis of aorta: Secondary | ICD-10-CM

## 2022-10-11 DIAGNOSIS — G620 Drug-induced polyneuropathy: Secondary | ICD-10-CM

## 2022-10-11 DIAGNOSIS — I10 Essential (primary) hypertension: Secondary | ICD-10-CM

## 2022-10-11 DIAGNOSIS — J438 Other emphysema: Secondary | ICD-10-CM

## 2022-10-11 NOTE — Progress Notes
Date of Service: 10/11/2022    Rachael Black is a 77 y.o. female.  DOB: 1945-05-08  MRN: [id number]     Subjective:             Rachael Black is here with c/o recurring bump on left posterior neck. Started years ago and at one time she squeezed it and thick grainy material came out. Since then it has grown back. She denies redness, fever or pain. No lymph node swelling is present.     Patient Reported Other  What topic(s) would you like to cover during your appointment?:  Cyst on back of neck  Please describe the issue(s) and history with the issue (location, severity, duration, symptoms, etc.).:  Back of neck  Seems larger  What has been done so far to take care of the issue(s)?:  Nothing  What are your goals for this visit?:  Find someone to remove cyst  Past Medical History:   Diagnosis Date    Arthritis     Back pain     Cancer (HCC) 06/2015    Breast ---Left    Foot fracture, left 09/26/2016    GERD (gastroesophageal reflux disease)     History of chemotherapy     neoadjuvant chemotherapy 2017    Hypertension     Multiple sclerosis (HCC) 1990    Neuropathy     Wrist fracture 09/26/2016    heads of ulna and radius     Surgical History:   Procedure Laterality Date    HX RHINOPLASTY  1974    HX TUBAL LIGATION  1976    ANKLE SURGERY Right 2000    with instrumentation    Left radioactive seed localized lumpectomy, left axillary sentinel lymph node biopsy  Left 12/14/2015    Performed by Cordelia Poche, MD at [facility] OR    Left distal radius and ulna open reduction internal fixation Left 10/03/2019    Performed by Desiree Hane, MD at [facility] OR    Left distal radius and ulna open reduction internal fixation Left 10/03/2019    Performed by Desiree Hane, MD at [facility] OR    COLONOSCOPY  08/13/2021    repeat in 5-10 years    COLONOSCOPY DIAGNOSTIC WITH SPECIMEN COLLECTION BY BRUSHING/ WASHING - FLEXIBLE N/A 08/13/2021    Performed by Amanda Cockayne, MD at [facility] OR    ESOPHAGOGASTRODUODENOSCOPY WITH BIOPSY - FLEXIBLE N/A 08/13/2021    Performed by Amanda Cockayne, MD at [facility] OR    LAPAROSCOPIC REPAIR PARAESOPHAGEAL HERNIA WITH/ WITHOUT FUNDOPLASTY AND IMPLANTATION OF MESH N/A 10/13/2021    Performed by Rudean Haskell, MD at [facility] OR    FOOT SURGERY Left      Family History   Problem Relation Name Age of Onset    Arthritis-osteo Mother      Thyroid Disease Mother      Heart Disease Father      High Cholesterol Father      Cancer-Colon Maternal Grandmother  47    Stroke Maternal Grandmother      Cancer-Colon Paternal Grandmother      Cancer Maternal Aunt      Cancer Maternal Uncle      Cancer Paternal Uncle       Social History     Socioeconomic History    Marital status: Married   Tobacco Use    Smoking status: Former     Current packs/day: 0.00     Average packs/day: 1  pack/day for 10.0 years (10.0 ttl pk-yrs)     Types: Cigarettes     Start date: 03/01/1995     Quit date: 02/28/2005     Years since quitting: 17.6    Smokeless tobacco: Never   Vaping Use    Vaping status: Never Used   Substance and Sexual Activity    Alcohol use: Yes     Alcohol/week: 8.0 - 12.0 standard drinks of alcohol     Types: 8 - 12 Standard drinks or equivalent per week    Drug use: No    Sexual activity: Not Currently   Social History Narrative    Patient lives with husband in town ([place] farm, [place]23). 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      Vaping/E-liquid Use    Vaping Use Never User                     Review of Systems   Constitutional:  Negative for activity change and appetite change.   HENT:          Mass left posterior neck just under the hairline       Objective:          acetaminophen SR(+) (TYLENOL) 650 mg tablet Take two tablets by mouth twice daily. Two tablets in the morning and three at night as needed for pain    alendronate (FOSAMAX) 70 mg tablet Take one tablet by mouth every 7 days. Take at least 30 minutes before breakfast with plain water. Do not lie down for 30 minutes.    aspirin 81 mg chewable tablet Chew one tablet by mouth daily.    ERGOcalciferoL (vitamin D2) (DRISDOL) 1,250 mcg (50,000 unit) capsule Take one capsule by mouth every 7 days.    ferrous sulfate 325 mg (65 mg iron) tablet Take one tablet by mouth daily. (Patient taking differently: Take one tablet by mouth at bedtime daily.)    gabapentin (NEURONTIN) 300 mg capsule Take 3 caps twice a day.  Indications: neuropathic pain    hyoscyamine (ANASPAZ) 0.125 mg rapid dissolve tablet Place one tablet under tongue every 6 hours.    lidocaine (LIDODERM) 5 % topical patch Apply one patch topically to affected area daily. Apply patch for 12 hours, then remove for 12 hours before repeating.    MULTIVITAMIN PO Take 1 tablet by mouth daily.    ondansetron (ZOFRAN ODT) 4 mg rapid dissolve tablet Dissolve one tablet by mouth every 6 hours as needed. Place on tongue to dissolve.    oxyCODONE (ROXICODONE) 5 mg tablet Take one tablet by mouth every 4 hours as needed.    pantoprazole DR (PROTONIX) 20 mg tablet Take one tablet by mouth daily.    polyethylene glycol 3350 (MIRALAX) 17 g packet Take one packet by mouth daily.    rosuvastatin (CRESTOR) 10 mg tablet Take one tablet by mouth at bedtime daily.    sennosides-docusate sodium (SENNA PLUS) 8.6/50 mg tablet Take one tablet by mouth daily.    simethicone (MYLICON) 80 mg chew tablet Chew one tablet by mouth every 6 hours as needed.    telmisartan (MICARDIS) 20 mg tablet Take one tablet by mouth daily.     Vitals:    10/11/22 0923   BP: 124/82   BP Source: Arm, Right Upper   Pulse: 72   Temp: 36.2 ?C (97.2 ?F)   Resp: 17   TempSrc: Temporal   Weight: 85.7 kg (189  lb)   Height: 158.8 cm (5' 2.5)     Body mass index is 34.02 kg/m?Marland Kitchen     Physical Exam  Neck:      Thyroid: No thyroid mass or thyromegaly.     Musculoskeletal:      Cervical back: Normal range of motion.   Lymphadenopathy:      Cervical: No cervical adenopathy.          Assessment and Plan:    Rodnesha Engholm. Cleopha Geeter was seen today for bump.    Diagnoses and all orders for this visit:    Neck mass  Has been told in the past that it was a sebaceous cyst  Due to proximity I suggested she fu with derm for excision  Multiple sclerosis [facility])  Patient has not seen neurology for some time now. She does not take any medications. She does not take a vit D supplement. Previously followed by Dr. Aileen Pilot neurology. Her symptoms are currently fairly well managed and is not interested in medications. Symptoms include fatigue.   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 Woolbright is a Caucasian female who presented to the [facility] on 06/23/2015 at age 77 for evaluation of left breast lump. Ms. Wheaton felt a lump in the middle of April 2017. She feels it is about the size of a quarter. She denies any nipple discharge, skin change or pain. She has not had imaging since 2015.  Ultrasound guided biopsy on 06/25/15 revealed grade 3 IDC.  Ms. Outwater completed neoadjuvant ddAC + Taxol on 11/30/15.  Ms. Volkov underwent left RSL lumpectomy/SLNB on 12/14/2015.  Final pathology revealed a complete pathologic response with 0/4 lymph nodes.  She completed radiation therapy with Dr. Janee Morn on 02/09/16.      BREAST IMAGING:  Mammogram:    -- Bilateral screening mammogram 01/02/14 [facility]) revealed scattered fibroglandular densities. There was no suspicious mass or architectural distortion.  -- Bilateral diagnostic mammogram 06/23/15 ([place]) revealed scattered fibroglandular densities. There was a dense, irregular, 2 cm mass at the patient's area of palpable concern in the upper left breast, 12:00 position, anterior to middle depth. No new suspicious abnormality was seen within the right breast on mammogram.  Ultrasound:  -- Targeted left breast ultrasound 06/23/15 ([place]) revealed at 12:00, 5 cm from the nipple, demonstrated a 2.1 x 1.6 x 1.9 cm irregular, not parallel, hypoechoic mass with angular margins and internal blood flow. There may be intraductal extension from this mass towards the nipple. No suspicious left axillary or left parasternal lymph nodes were identified.  MRI:  -- Bilateral breast MRI 07/23/15 ([place]) revealed no area of suspicious enhancement in the right breast. No adenopathy, skin or nipple abnormalities were identified. Within the left breast superiorly at approximately 12:00 there was the known malignancy measuring 1.6 cm. 3 cm posterior and inferior to the mass within the middle depth central breast slightly medial, there was a 4 mm circumscribed enhancing mass which was stable on multiple prior mammograms dating back with June 2012. No  skin or nipple abnormalities were present. No axillary abnormalities were present.    REPRODUCTIVE HEALTH:  Age at first Menarche:  39  Age at First Live Birth:  29  Age at Menopause:  14, took HRT x 2 years  Gravida:  2  Para: 2  Breastfeeding:  No    PROCEDURE:   1.  Right breast cyst excision, 1990s  2.  Left RSL lumpectomy/SLNB, 12/14/2015 [name])  PERTINENT PMH: Multiple sclerosis, HTN, HLD, GERD   FAMILY HISTORY:  No family history of breast cancer  PHYSICAL EXAM on PRESENTATION:  Left - 2.5 cm lump at 12:00, 5 cm FTN. No skin or nipple changes. Right - No palpable breast masses. No skin, nipple, or areolar change. No supraclavicular or axillary adenopathy.    MEDICAL ONCOLOGY: Dr. Stasia Cavalier NEOADJUVANT THERAPY:  Neoadjuvant ddAC + Taxol 07/20/15 - 11/23/15 PRESENT THERAPY: Observation  REFERRED BY:  Dr. Merian Capron    TREATMENT HISTORY:   Dose dense AC 522/17 - 08/31/15  Weekly Taxol 09/14/15

## 2022-10-25 ENCOUNTER — Encounter: Admit: 2022-10-25 | Discharge: 2022-10-25 | Payer: MEDICARE

## 2022-10-25 ENCOUNTER — Ambulatory Visit: Admit: 2022-10-25 | Discharge: 2022-10-26 | Payer: MEDICARE

## 2022-10-25 DIAGNOSIS — R011 Cardiac murmur, unspecified: Secondary | ICD-10-CM

## 2022-10-25 DIAGNOSIS — G35 Multiple sclerosis: Secondary | ICD-10-CM

## 2022-10-25 DIAGNOSIS — I10 Essential (primary) hypertension: Secondary | ICD-10-CM

## 2022-10-25 DIAGNOSIS — Z9221 Personal history of antineoplastic chemotherapy: Secondary | ICD-10-CM

## 2022-10-25 DIAGNOSIS — S62109A Fracture of unspecified carpal bone, unspecified wrist, initial encounter for closed fracture: Secondary | ICD-10-CM

## 2022-10-25 DIAGNOSIS — G629 Polyneuropathy, unspecified: Secondary | ICD-10-CM

## 2022-10-25 DIAGNOSIS — H269 Unspecified cataract: Secondary | ICD-10-CM

## 2022-10-25 DIAGNOSIS — S92902A Unspecified fracture of left foot, initial encounter for closed fracture: Secondary | ICD-10-CM

## 2022-10-25 DIAGNOSIS — M2550 Pain in unspecified joint: Secondary | ICD-10-CM

## 2022-10-25 DIAGNOSIS — IMO0002 Ulcer: Secondary | ICD-10-CM

## 2022-10-25 DIAGNOSIS — K219 Gastro-esophageal reflux disease without esophagitis: Secondary | ICD-10-CM

## 2022-10-25 DIAGNOSIS — C801 Malignant (primary) neoplasm, unspecified: Secondary | ICD-10-CM

## 2022-10-25 DIAGNOSIS — M4800 Spinal stenosis, site unspecified: Secondary | ICD-10-CM

## 2022-10-25 DIAGNOSIS — M1990 Unspecified osteoarthritis, unspecified site: Secondary | ICD-10-CM

## 2022-10-25 DIAGNOSIS — M549 Dorsalgia, unspecified: Secondary | ICD-10-CM

## 2022-10-26 ENCOUNTER — Encounter: Admit: 2022-10-26 | Discharge: 2022-10-26 | Payer: MEDICARE

## 2022-11-02 ENCOUNTER — Encounter: Admit: 2022-11-02 | Discharge: 2022-11-02 | Payer: MEDICARE

## 2022-11-07 ENCOUNTER — Encounter: Admit: 2022-11-07 | Discharge: 2022-11-07 | Payer: MEDICARE

## 2022-11-08 ENCOUNTER — Encounter: Admit: 2022-11-08 | Discharge: 2022-11-08 | Payer: MEDICARE

## 2022-11-08 ENCOUNTER — Ambulatory Visit: Admit: 2022-11-08 | Discharge: 2022-11-08 | Payer: MEDICARE

## 2022-11-08 DIAGNOSIS — M5416 Radiculopathy, lumbar region: Secondary | ICD-10-CM

## 2022-11-08 DIAGNOSIS — M1990 Unspecified osteoarthritis, unspecified site: Secondary | ICD-10-CM

## 2022-11-08 DIAGNOSIS — C801 Malignant (primary) neoplasm, unspecified: Secondary | ICD-10-CM

## 2022-11-08 DIAGNOSIS — M48062 Spinal stenosis, lumbar region with neurogenic claudication: Secondary | ICD-10-CM

## 2022-11-08 DIAGNOSIS — G629 Polyneuropathy, unspecified: Secondary | ICD-10-CM

## 2022-11-08 DIAGNOSIS — S92902A Unspecified fracture of left foot, initial encounter for closed fracture: Secondary | ICD-10-CM

## 2022-11-08 DIAGNOSIS — Z9221 Personal history of antineoplastic chemotherapy: Secondary | ICD-10-CM

## 2022-11-08 DIAGNOSIS — M549 Dorsalgia, unspecified: Secondary | ICD-10-CM

## 2022-11-08 DIAGNOSIS — S62109A Fracture of unspecified carpal bone, unspecified wrist, initial encounter for closed fracture: Secondary | ICD-10-CM

## 2022-11-08 DIAGNOSIS — H269 Unspecified cataract: Secondary | ICD-10-CM

## 2022-11-08 DIAGNOSIS — IMO0002 Ulcer: Secondary | ICD-10-CM

## 2022-11-08 DIAGNOSIS — G35 Multiple sclerosis: Secondary | ICD-10-CM

## 2022-11-08 DIAGNOSIS — R011 Cardiac murmur, unspecified: Secondary | ICD-10-CM

## 2022-11-08 DIAGNOSIS — M2550 Pain in unspecified joint: Secondary | ICD-10-CM

## 2022-11-08 DIAGNOSIS — I10 Essential (primary) hypertension: Secondary | ICD-10-CM

## 2022-11-08 DIAGNOSIS — M4800 Spinal stenosis, site unspecified: Secondary | ICD-10-CM

## 2022-11-08 DIAGNOSIS — K219 Gastro-esophageal reflux disease without esophagitis: Secondary | ICD-10-CM

## 2022-11-08 MED ORDER — LIDOCAINE (PF) 10 MG/ML (1 %) IJ SOLN
2 mL | Freq: Once | INTRAMUSCULAR | 0 refills | Status: CP
Start: 2022-11-08 — End: ?

## 2022-11-08 MED ORDER — IOHEXOL 240 MG IODINE/ML IV SOLN
1 mL | Freq: Once | EPIDURAL | 0 refills | Status: CP
Start: 2022-11-08 — End: ?

## 2022-11-08 MED ORDER — TRIAMCINOLONE ACETONIDE 40 MG/ML IJ SUSP
80 mg | Freq: Once | EPIDURAL | 0 refills | Status: CP
Start: 2022-11-08 — End: ?

## 2022-11-08 NOTE — Discharge Instructions - Supplementary Instructions
General Post-Procedure Instructions: Pain Injection      Procedure Completed Today:  Joint Injection (hip, knee, shoulder)  Epidural Steroid Injection (Cervical; Thoracic; Lumbar)  Transforaminal Steroid Injection (Cervical; Lumbar)  Facet Joint Injection  Other: ___________________________    Important information following your procedure today:  You may drive today     If you had sedation, you may NOT drive today  Rest at home for the next 6 hours.  You may then begin to resume your normal activities.  DO NOT drive any vehicle, operate any power tools, drink alcohol, make any major decisions, or sign any legal documents for the next 12 hours.  Pain relief may not be immediate. It is possible you may even experience an increase in pain during the first 24-48 hours followed by a gradual decrease of your pain.  Though the procedure is generally safe, and complications are rare, we do ask that you be aware of any of the following:  Any swelling, persistent redness, new bleeding or drainage from the site of the injection.  You should not experience a severe headache.  You should not run a fever over 101oF.  New onset of sharp, severe back and or neck pain.  New onset of upper or lower extremity numbness or weakness.  New difficulty controlling bowel or bladder function after injection.  New shortness of breath.  If any of these occur, please call to report this occurrence to Dr. Carroll at [phone]. If you are calling after 4:00pm, on the weekends or holidays please call [phone] and ask to have the pain management resident physician on call for the physician paged or go to your local emergency room.   You may experience soreness at the injection site. Ice can be applied at 20-minute intervals for the first 24 hours. The following day you may alternate ice with heat if you are experiencing muscle tightness, otherwise continue with ice. Ice works best at decreasing pain. Avoid application of direct heat, hot showers or hot tubs today.  Avoid strenuous activity today. You many resume your regular activities and exercise tomorrow.  Patients with diabetes may see an elevation in blood sugars for 7-10 days after the injection. It is important to pay close attention to your diet, check your blood sugars daily and report extreme elevations to the physician that treats your diabetes.  Patients taking daily blood thinners can resume their regular dose this evening.  It is important that you take all medications ordered by your pain physician. Taking medications as ordered is an important part of your pain care plan. If you cannot continue the medication plan, please notify the physician.    Possible side effects to steroids that may occur:  Flushing or redness of the face  Irritability  Fluid retention  Change in women's menses    Follow up appointment as needed. In the event you are unable to keep an appointment, please notify the scheduler 24 hours in advance at [phone].

## 2022-11-08 NOTE — Procedures
Attending Surgeon: Lizbeth Bark, MD    Anesthesia: Local    Pre-Procedure Diagnosis:   1. Lumbar radiculopathy    2. Lumbar stenosis with neurogenic claudication        Post-Procedure Diagnosis:   1. Lumbar radiculopathy    2. Lumbar stenosis with neurogenic claudication        Epidural Steroid Injection Lumbar/Caudal  Procedure: epidural - interlaminar    Laterality: n/a   on 11/08/2022 10:56 AM  Location: lumbar ESI with imaging - L3-4      Consent:   Consent obtained: verbal and written  Consent given by: patient  Risks discussed: bleeding, bruising, infection, weakness and no change or worsening in pain    Discussed with patient the purpose of the treatment/procedure, other ways of treating my condition, including no treatment/ procedure and the risks and benefits of the alternatives. Patient has decided to proceed with treatment/procedure.        Universal Protocol:  Relevant documents: relevant documents present and verified  Test results: test results available and properly labeled  Imaging studies: imaging studies available  Required items: required blood products, implants, devices, and special equipment available  Site marked: the operative site was marked  Patient identity confirmed: Patient identify confirmed verbally with patient.        Time out: Immediately prior to procedure a time out was called to verify the correct patient, procedure, equipment, support staff and site/side marked as required      Procedures Details:   Indications: pain   Prep: chlorhexidine  Number of Levels: 1  Approach: left paramedian  Needle size: 18 G  Patient tolerance: Patient tolerated the procedure well with no immediate complications. Pressure was applied, and hemostasis was accomplished.  Comments: DESCRIPTION OF PROCEDURE:  The procedure risks and benefits were explained to the patient and informed consent was obtained.  The patient was positioned prone on the fluoroscopy table with a pillow under the abdomen to help reduce lumbar lordosis.  Blood pressure cuff and oxygen saturation monitors were attached and the patient was monitored throughout the entire procedure.  The L3 vertebral level was identified with the use of fluoroscopy in the AP view.  The skin was prepped using Chlorhexadine and draped in aseptic fashion.  The skin and subcutaneous tissue were anesthetized using 3 mL of 1 percent lidocaine with a 27-gauge needle.  A 3.5 inch 22-gauge Tuohy needle was slowly advanced using AP view towards the midline L3-L4 epidural space.  The latter part of the needle advancement was guided with fluoroscopy in the lateral view.  The epidural space was identified using loss of resistance technique.  After negative aspiration, 1 mL of Isovue contrast dye was injected.  After epidural spread was seen, a solution containing 80 mg of triamcinolone and 2 mL of normal saline was injected in increments.  The stylet was reinserted and the needle was then removed.     After the procedure, the patient's blood pressure, heart rate, oxygen saturation, and VAS were recorded in the chart. There were no complications.  The patient tolerated the procedure well and was brought to recovery room for observation in stable condition and discharged with written discharge instructions.     PLAN OF CARE:  The patient is to followup in 6 weeks.    The patient was advised to contact the [facility] for any of the following    1. Fever, chills, or night sweats.  2. New onset of severe sharp pain.  3. Any new upper or lower extremity weakness or numbness.  4. Any questions regarding the procedure.  This patient's clinical history, exam, AND imaging support radiculopathy AND there is a significant impact on quality of life and function AND the pain has been present for at least 4 weeks AND they have failed to improve with noninvasive conservative care.  This patient had at least 50% pain relief for at least 3 months with the last epidural injection.    This patient's pain is so severe it results in a significant degree of disability. Prior ESI has provided at least a 50% improvement in pain and function for at least 3 months. The patient's Primary Care Physician has been notified of the continuation of this procedure and prolonged repeat steroid use. The patient is not a surgical candidate.    Estimated blood loss: none or minimal  Specimens: none  Patient tolerated the procedure well with no immediate complications. Pressure was applied, and hemostasis was accomplished.  Administrations This Visit       iohexoL (OMNIPAQUE-240) 240 mg/mL injection 1 mL       Admin Date  11/08/2022 Action  Given Dose  1 mL Route  Epidural Documented By  Roberts Gaudy, RN              lidocaine PF 1% (10 mg/mL) injection 2 mL       Admin Date  11/08/2022 Action  Given Dose  2 mL Route  Injection Documented By  Roberts Gaudy, RN              triamcinolone acetonide [facility]) injection 80 mg       Admin Date  11/08/2022 Action  Given Dose  80 mg Route  Epidural Documented By  Roberts Gaudy, RN

## 2022-11-08 NOTE — Progress Notes
SPINE CENTER  INTERVENTIONAL PAIN PROCEDURE HISTORY AND PHYSICAL    No chief complaint on file.      HISTORY OF PRESENT ILLNESS:  Kathry Hilgeman is a 77 y.o. year old female who presents for injection.  Denies fevers, chills, or recent hospitalizations.  Patient denies currently taking blood thinning medications.       Past Medical History:   Diagnosis Date    Arthritis     Back pain     Cancer (HCC) 06/2015    Breast ---Left    Cataract 2014    Foot fracture, left 09/26/2016    GERD (gastroesophageal reflux disease)     Heart murmur 1995    History of chemotherapy     neoadjuvant chemotherapy 2017    Hypertension     Joint pain 2017    Multiple sclerosis (HCC) 1990    Neuropathy     Spinal stenosis 2022    Ulcer 2015    Wrist fracture 09/26/2016    heads of ulna and radius       Surgical History:   Procedure Laterality Date    HX RHINOPLASTY  1974    HX TUBAL LIGATION  1976    ANKLE SURGERY Right 2000    with instrumentation    Left radioactive seed localized lumpectomy, left axillary sentinel lymph node biopsy  Left 12/14/2015    Performed by Cordelia Poche, MD at [facility] OR    Left distal radius and ulna open reduction internal fixation Left 10/03/2019    Performed by Desiree Hane, MD at [facility] OR    Left distal radius and ulna open reduction internal fixation Left 10/03/2019    Performed by Desiree Hane, MD at [facility] OR    COLONOSCOPY  08/13/2021    repeat in 5-10 years    COLONOSCOPY DIAGNOSTIC WITH SPECIMEN COLLECTION BY BRUSHING/ WASHING - FLEXIBLE N/A 08/13/2021    Performed by Amanda Cockayne, MD at [facility] OR    ESOPHAGOGASTRODUODENOSCOPY WITH BIOPSY - FLEXIBLE N/A 08/13/2021    Performed by Amanda Cockayne, MD at [facility] OR    LAPAROSCOPIC REPAIR PARAESOPHAGEAL HERNIA WITH/ WITHOUT FUNDOPLASTY AND IMPLANTATION OF MESH N/A 10/13/2021    Performed by Rudean Haskell, MD at [facility] OR    FOOT SURGERY Left     HX WRIST FRACTURE SURGERY  2021    PR LAPAROSCOPY SURG RPR INITIAL INGUINAL HERNIA         family history includes Arthritis in her mother; Arthritis-osteo in her mother; Cancer in her maternal aunt, maternal grandmother, maternal uncle, and paternal uncle; Cancer-Colon in her paternal grandmother; Company secretary (age of onset: 41) in her maternal grandmother; Heart Disease in her father; Heart problem in her father; High Cholesterol in her father; Osteoporosis in her mother; Stroke in her maternal grandmother; Thyroid Disease in her mother.    Social History     Socioeconomic History    Marital status: Married   Tobacco Use    Smoking status: Former     Current packs/day: 0.00     Average packs/day: 1 pack/day for 15.0 years (15.0 ttl pk-yrs)     Types: Cigarettes     Start date: 03/01/1995     Quit date: 02/28/2005     Years since quitting: 17.7    Smokeless tobacco: Never   Vaping Use    Vaping status: Never Used   Substance and Sexual Activity    Alcohol use: Yes     Alcohol/week:  10.0 standard drinks of alcohol     Types: 10 Drinks containing 0.5 oz of alcohol per week    Drug use: No    Sexual activity: Yes     Partners: Male     Birth control/protection: Post-menopausal   Social History Narrative    Patient lives with husband in town ([place] farm, [place]23). 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        Allergies   Allergen Reactions    Sulfa (Sulfonamide Antibiotics) HIVES and SHORTNESS OF BREATH       There were no vitals filed for this visit.     Oswestry Total Score:: 38    REVIEW OF SYSTEMS: 10 point ROS obtained and negative except bck pain      PHYSICAL EXAM:  General: 77 y.o. female appears stated age, in no acute distress  HEENT: Normocephalic, atraumatic  Neck: No thyroidmegaly  Cardiovascular: Well perfused  Pulmonary: Unlabored respirations  Extremities: No cyanosis, clubbing, or edema  Skin: No lesions seen on exposed skin  Psychiatric:  Appropriate mood and affect  Musculoskeletal: No atrophy.   Neurologic: Antigravity strength in all extremities. CN II -XII grossly intact.  Alert and oriented x 3.           IMPRESSION:    1. Lumbar radiculopathy    2. Lumbar stenosis with neurogenic claudication         PLAN: Lumbar Epidural Steroid Injection L3-4

## 2022-11-14 ENCOUNTER — Encounter: Admit: 2022-11-14 | Discharge: 2022-11-14 | Payer: MEDICARE

## 2022-11-14 DIAGNOSIS — I2510 Atherosclerotic heart disease of native coronary artery without angina pectoris: Secondary | ICD-10-CM

## 2022-11-14 DIAGNOSIS — E782 Mixed hyperlipidemia: Secondary | ICD-10-CM

## 2022-11-14 DIAGNOSIS — I10 Essential (primary) hypertension: Secondary | ICD-10-CM

## 2022-11-14 MED ORDER — TELMISARTAN 20 MG PO TAB
20 mg | ORAL_TABLET | Freq: Every day | ORAL | 0 refills | 90.00000 days | Status: AC
Start: 2022-11-14 — End: ?

## 2022-11-14 NOTE — Telephone Encounter
Patient LVM asking telmisartan to be refilled to [facility] pharmacy - sent per patient request.  Will mail lab orders on 9/18.  LVM for patient to let her know this has been done.

## 2022-11-15 ENCOUNTER — Encounter: Admit: 2022-11-15 | Discharge: 2022-11-15 | Payer: MEDICARE

## 2022-11-15 NOTE — Telephone Encounter
Lumbar Epidural Steroid Injection L3-4      9/10 inj, called today reporting slight relief for short time after injection. Pain is nearly as bad as prior to injection.   Good relief for 2 days post injection. Will run by treatment team for POC

## 2022-11-16 ENCOUNTER — Encounter: Admit: 2022-11-16 | Discharge: 2022-11-16 | Payer: MEDICARE

## 2022-11-22 ENCOUNTER — Encounter: Admit: 2022-11-22 | Discharge: 2022-11-22 | Payer: MEDICARE

## 2022-11-22 DIAGNOSIS — M5416 Radiculopathy, lumbar region: Secondary | ICD-10-CM

## 2022-11-22 DIAGNOSIS — R29898 Other symptoms and signs involving the musculoskeletal system: Secondary | ICD-10-CM

## 2022-11-22 DIAGNOSIS — M48061 Spinal stenosis, lumbar region without neurogenic claudication: Secondary | ICD-10-CM

## 2022-11-22 DIAGNOSIS — M48062 Spinal stenosis, lumbar region with neurogenic claudication: Secondary | ICD-10-CM

## 2022-11-23 ENCOUNTER — Encounter: Admit: 2022-11-23 | Discharge: 2022-11-23 | Payer: MEDICARE

## 2022-11-28 ENCOUNTER — Ambulatory Visit: Admit: 2022-11-28 | Discharge: 2022-11-28 | Payer: MEDICARE

## 2022-11-28 ENCOUNTER — Encounter: Admit: 2022-11-28 | Discharge: 2022-11-28 | Payer: MEDICARE

## 2022-11-28 DIAGNOSIS — C801 Malignant (primary) neoplasm, unspecified: Secondary | ICD-10-CM

## 2022-11-28 DIAGNOSIS — H269 Unspecified cataract: Secondary | ICD-10-CM

## 2022-11-28 DIAGNOSIS — K219 Gastro-esophageal reflux disease without esophagitis: Secondary | ICD-10-CM

## 2022-11-28 DIAGNOSIS — M549 Dorsalgia, unspecified: Secondary | ICD-10-CM

## 2022-11-28 DIAGNOSIS — S62109A Fracture of unspecified carpal bone, unspecified wrist, initial encounter for closed fracture: Secondary | ICD-10-CM

## 2022-11-28 DIAGNOSIS — I10 Essential (primary) hypertension: Secondary | ICD-10-CM

## 2022-11-28 DIAGNOSIS — M1990 Unspecified osteoarthritis, unspecified site: Secondary | ICD-10-CM

## 2022-11-28 DIAGNOSIS — G35 Multiple sclerosis: Secondary | ICD-10-CM

## 2022-11-28 DIAGNOSIS — M25559 Pain in unspecified hip: Secondary | ICD-10-CM

## 2022-11-28 DIAGNOSIS — IMO0002 Ulcer: Secondary | ICD-10-CM

## 2022-11-28 DIAGNOSIS — M4800 Spinal stenosis, site unspecified: Secondary | ICD-10-CM

## 2022-11-28 DIAGNOSIS — Z9221 Personal history of antineoplastic chemotherapy: Secondary | ICD-10-CM

## 2022-11-28 DIAGNOSIS — S92902A Unspecified fracture of left foot, initial encounter for closed fracture: Secondary | ICD-10-CM

## 2022-11-28 DIAGNOSIS — M2550 Pain in unspecified joint: Secondary | ICD-10-CM

## 2022-11-28 DIAGNOSIS — R011 Cardiac murmur, unspecified: Secondary | ICD-10-CM

## 2022-11-28 DIAGNOSIS — G629 Polyneuropathy, unspecified: Secondary | ICD-10-CM

## 2022-11-28 NOTE — Progress Notes
[facility]ENTER CLINIC NOTE       SUBJECTIVE:   Rachael Black is a 77 y.o.-year-old female with history of multiple sclerosis, GERD, hyperlipidemia, coronary artery disease, breast cancer, and chemotherapy-induced polyneuropathy and, who presents for scheduled follow after L3-L4 LESI on 11/08/2022.  After this injection patient had 3-5 days of relief in her pain however her pain has significantly worsened and has now had increasing difficulties with ambulation as well as mobility.  States that her pain is localized to the right lateral aspect/gluteal region of her right hip.  Symptoms are nonradiating and are localized to the hip.  Has been with difficulty sleeping on her right side as well as ambulation.  Did utilize lidocaine patch which did significantly alleviate her symptoms however did not completely resolve symptoms.  Has not been in physical therapy.  Was with concerns for worsening symptoms and ultimately completed outside MRI.  Denies any bowel or bladder incontinence, states that her right lower extremity weakness is secondary to pain.     The patient has previously been seen on 11/16/2021 for trigger point injections in the upper back.  Patient reports relief with these injections.  Prior to that, she was seen on 04/21/2021 and underwent a bilateral L3 transforaminal epidural steroid injection.  Patient feels like this was helpful.       Review of Systems    Current Outpatient Medications:     acetaminophen SR(+) (TYLENOL) 650 mg tablet, Take two tablets by mouth twice daily. Two tablets in the morning and three at night as needed for pain, Disp: , Rfl:     alendronate (FOSAMAX) 70 mg tablet, Take one tablet by mouth every 7 days. Take at least 30 minutes before breakfast with plain water. Do not lie down for 30 minutes., Disp: 12 tablet, Rfl: 3    aspirin 81 mg chewable tablet, Chew one tablet by mouth daily., Disp: , Rfl:     ferrous sulfate 325 mg (65 mg iron) tablet, Take one tablet by mouth daily. (Patient taking differently: Take one tablet by mouth at bedtime daily.), Disp: 30 tablet, Rfl: 3    gabapentin (NEURONTIN) 300 mg capsule, Take 3 caps twice a day.  Indications: neuropathic pain, Disp: 540 capsule, Rfl: 3    hyoscyamine (ANASPAZ) 0.125 mg rapid dissolve tablet, Place one tablet under tongue every 6 hours., Disp: 180 tablet, Rfl: 0    lidocaine (LIDODERM) 5 % topical patch, Apply one patch topically to affected area daily. Apply patch for 12 hours, then remove for 12 hours before repeating., Disp: 90 patch, Rfl: 3    MULTIVITAMIN PO, Take 1 tablet by mouth daily., Disp: , Rfl:     ondansetron (ZOFRAN ODT) 4 mg rapid dissolve tablet, Dissolve one tablet by mouth every 6 hours as needed. Place on tongue to dissolve., Disp: 30 tablet, Rfl: 0    oxyCODONE (ROXICODONE) 5 mg tablet, Take one tablet by mouth every 4 hours as needed., Disp: 15 tablet, Rfl: 0    pantoprazole DR (PROTONIX) 20 mg tablet, Take one tablet by mouth daily., Disp: 90 tablet, Rfl: 3    polyethylene glycol 3350 (MIRALAX) 17 g packet, Take one packet by mouth daily., Disp: 12 packet, Rfl: 0    rosuvastatin (CRESTOR) 10 mg tablet, Take one tablet by mouth at bedtime daily., Disp: 90 tablet, Rfl: 3    sennosides-docusate sodium (SENNA PLUS) 8.6/50 mg tablet, Take one tablet by mouth daily., Disp: 90 tablet, Rfl: 0    simethicone (MYLICON)  80 mg chew tablet, Chew one tablet by mouth every 6 hours as needed., Disp: 30 tablet, Rfl: 0    telmisartan (MICARDIS) 20 mg tablet, Take one tablet by mouth daily., Disp: 90 tablet, Rfl: 0  Allergies   Allergen Reactions    Sulfa (Sulfonamide Antibiotics) HIVES and SHORTNESS OF BREATH     Physical Exam  Vitals:    11/28/22 1353   BP: 111/52   BP Source: Arm, Right Upper   Pulse: 86   SpO2: 93%   PainSc: Four      Telehealth Patient Reported Vitals       Row Name 11/28/22 1353                Pain Score Four        Patient Position Sitting        BP Source Arm, Right Upper Pain Score: Four  There is no height or weight on file to calculate BMI.       IMPRESSION:  1. Greater trochanteric pain syndrome    2. Historically with lumbar radiculopathy, lumbar stenosis with neurogenic claudication,   3.  Undifferentiated right knee pain, possibly secondary to IT band tendinopathy    PLAN:    1.  Lifestyle modifications.  Recommend activity as tolerated.  Avoid provocative maneuvers.  Keep spine in neutral position.   -Given patient's benefit from lidocaine patches over her right gluteal area as well as pain relief with heat, recommend the patient continue with conservative treatment including icing of the GTB.  2.  Medications.  No changes indicated at this time.  Continue medications as previously prescribed.  3.  Therapy.  Will refer to formalized physical therapy after completion of GTB provide steroid injection  4.  Imaging.  No new imaging at this time given recent MRI of the lumbar spine  5.  Interventions.     -Will schedule for right greater trochanteric hip bursa injection given short duration of L3-L4 LESI.   -The patient is without any significant relief after this injection may consider L5-S1 LESI injection given mild changes seen on MRI of her lumbar spine in comparison to 2023 imaging.  6.  Follow-up.  Patient to follow-up for GTB injection and after physical therapy   -If the patient is without any significant benefit from injection    Patient was seen and evaluated by Dr. Cletis Media.  This note was completed by Dr. Audie Clear PM&R PGY4     ATTESTATION    I personally performed the key portions of the E/M visit, discussed case with resident and concur with resident documentation of history, physical exam, assessment, and treatment plan unless otherwise noted.    Staff name:  Lizbeth Bark, MD Date:  11/28/2022

## 2022-11-30 ENCOUNTER — Ambulatory Visit: Admit: 2022-11-30 | Discharge: 2022-11-30 | Payer: MEDICARE

## 2022-11-30 ENCOUNTER — Encounter: Admit: 2022-11-30 | Discharge: 2022-11-30 | Payer: MEDICARE

## 2022-11-30 DIAGNOSIS — I2510 Atherosclerotic heart disease of native coronary artery without angina pectoris: Secondary | ICD-10-CM

## 2022-11-30 DIAGNOSIS — E782 Mixed hyperlipidemia: Secondary | ICD-10-CM

## 2022-11-30 LAB — COMPREHENSIVE METABOLIC PANEL
ALBUMIN: 4.2 g/dL (ref 3.5–5.0)
ALK PHOSPHATASE: 60 U/L (ref 25–110)
ALT: 12 U/L (ref 7–56)
AST: 17 U/L (ref 7–40)
BLD UREA NITROGEN: 19 mg/dL (ref 7–25)
CALCIUM: 10 mg/dL (ref 8.5–10.6)
CHLORIDE: 100 MMOL/L (ref 98–110)
CREATININE: 0.9 mg/dL (ref 0.4–1.00)
EGFR: >60 mL/min (ref >60)
GLUCOSE,PANEL: 87 mg/dL (ref 70–100)
POTASSIUM: 4.6 MMOL/L (ref <100)
SODIUM: 139 MMOL/L (ref >40)
TOTAL BILIRUBIN: 0.5 mg/dL (ref 0.2–1.3)
TOTAL PROTEIN: 7.5 g/dL (ref 6.0–8.0)

## 2022-11-30 LAB — LIPID PROFILE
CHOLESTEROL: 156 mg/dL (ref <200)
TRIGLYCERIDES: 116 mg/dL (ref <150)

## 2022-12-12 ENCOUNTER — Encounter: Admit: 2022-12-12 | Discharge: 2022-12-12 | Payer: MEDICARE

## 2022-12-12 ENCOUNTER — Ambulatory Visit: Admit: 2022-12-12 | Discharge: 2022-12-13 | Payer: MEDICARE

## 2022-12-12 DIAGNOSIS — IMO0002 Ulcer: Secondary | ICD-10-CM

## 2022-12-12 DIAGNOSIS — M4800 Spinal stenosis, site unspecified: Secondary | ICD-10-CM

## 2022-12-12 DIAGNOSIS — H269 Unspecified cataract: Secondary | ICD-10-CM

## 2022-12-12 DIAGNOSIS — G629 Polyneuropathy, unspecified: Secondary | ICD-10-CM

## 2022-12-12 DIAGNOSIS — M2550 Pain in unspecified joint: Secondary | ICD-10-CM

## 2022-12-12 DIAGNOSIS — M1990 Unspecified osteoarthritis, unspecified site: Secondary | ICD-10-CM

## 2022-12-12 DIAGNOSIS — R011 Cardiac murmur, unspecified: Secondary | ICD-10-CM

## 2022-12-12 DIAGNOSIS — I10 Essential (primary) hypertension: Secondary | ICD-10-CM

## 2022-12-12 DIAGNOSIS — Z9221 Personal history of antineoplastic chemotherapy: Secondary | ICD-10-CM

## 2022-12-12 DIAGNOSIS — E782 Mixed hyperlipidemia: Secondary | ICD-10-CM

## 2022-12-12 DIAGNOSIS — Z136 Encounter for screening for cardiovascular disorders: Secondary | ICD-10-CM

## 2022-12-12 DIAGNOSIS — Z853 Personal history of malignant neoplasm of breast: Secondary | ICD-10-CM

## 2022-12-12 DIAGNOSIS — C801 Malignant (primary) neoplasm, unspecified: Secondary | ICD-10-CM

## 2022-12-12 DIAGNOSIS — M549 Dorsalgia, unspecified: Secondary | ICD-10-CM

## 2022-12-12 DIAGNOSIS — S62109A Fracture of unspecified carpal bone, unspecified wrist, initial encounter for closed fracture: Secondary | ICD-10-CM

## 2022-12-12 DIAGNOSIS — S92902A Unspecified fracture of left foot, initial encounter for closed fracture: Secondary | ICD-10-CM

## 2022-12-12 DIAGNOSIS — G35 Multiple sclerosis: Secondary | ICD-10-CM

## 2022-12-12 DIAGNOSIS — R931 Abnormal findings on diagnostic imaging of heart and coronary circulation: Secondary | ICD-10-CM

## 2022-12-12 DIAGNOSIS — K449 Diaphragmatic hernia without obstruction or gangrene: Secondary | ICD-10-CM

## 2022-12-12 DIAGNOSIS — K219 Gastro-esophageal reflux disease without esophagitis: Secondary | ICD-10-CM

## 2022-12-12 NOTE — Patient Instructions
Follow-Up:    -Thank you for allowing [place] to participate in your care today. Your After Visit Summary is being completed by [facility].    -We would like you to follow up in  1 years with [name], MD  -The schedule is released approximately 4-5 months in advance. You will be called by our scheduling department to make an appointment and you will also receive a notification via MyChart to self-schedule.  However, if you would like to call to make this appointment, please call [phone].    -Please schedule the following testing at check out, or by calling our scheduling line: ECHOCARDIOGRAM    Changes From Today's Office Visit  Please check your blood pressure daily - Jeanice Lim will reach out in 6 weeks for a list of your readings.    Contacting our office:    -Business Hours: Monday-Friday, 8:00 am-4:30 pm (excluding Holidays).     -For medical questions or concerns, please send [place] a message through your MyChart account or call the RED team nursing triage line at [phone]. Please leave a detailed message with your name, date of birth, and reason for your call.  If your message is received before 3:30pm, every effort will be made to call you back the same day.  Please allow time for [place] to review your chart prior to call back.     -For medication refills please start by contacting your pharmacy. You can also send [place] a prescription question through your MyChart or call the nurse triage line above.     -Should you have an immediate need of the weekend/nights and holidays, please call our on-call triage line at [phone].    -Red team fax number: [phone]    -You may receive a survey in the upcoming weeks from [employer] [facility]. Your feedback is important to [place] and helps [place] continue to improve patient care and patient satisfaction.     -Please feel free to call our Financial Department at [phone] with any questions or concerns about estimated cost of testing or imaging ordered today. We are happy to provide CPT codes upon request.    Results & Testing Follow Up:    -Please allow 5-7 business days for the results of any testing to be reviewed. Please call our office if you have not heard from a nurse within this time frame.    -Should you choose to complete testing at an outside facility, please contact our office after completion of testing so that we can ensure that we have received results for your provider to review.    Lab and test results:  As a part of the CARES act, starting 05/30/2019, some results will be released to you via MyChart immediately and automatically.  You may see results before your provider sees them; however, your provider will review all these results and then they, or one of their team, will notify you of result information and recommendations.   Critical results will be addressed immediately, but otherwise, please allow [place] time to get back with you prior to you reaching out to [place] for questions.  This will usually take about 72 hours for labs and 5-7 days for procedure test results.\

## 2022-12-12 NOTE — Progress Notes
Cardiovascular Medicine       Date of Service: 12/12/2022    Rachael Black is a 77 y.o. female.       HPI     Rachael Black was seen today in the Cardiovascular Medicine Clinic at [facility] at our [place], [place] office.    I had the very nice pleasure of seeing Mrs. Rachael Black today for cardiovascular follow-up.      The patient is a 77 year old with a history of coronary atherosclerosis, aortic valve sclerosis with mild regurgitation, essential hypertension, dyslipidemia, left breast cancer, peripheral neuropathy, and parasophageal hernia status post laparoscopic repair.    She presents for a cardiology follow-up. She is currently on rosuvastatin and Micardis for dyslipidemia and hypertension, respectively.    The patient reports significant right hip and knee pain, which she describes as debilitating and preventing her from being more active than she would like.  She expresses frustration with these symptoms are interfering with performing daily tasks. She is scheduled for an injection in her hip bursa, which she hopes will alleviate the pain in her knee as well.    She manages her pain with gabapentin and extra-strength arthritis Tylenol, which she feels doesn't provide significant relief until she misses a dose. She also periodically checks her blood pressure at home, which she reports as being better than readings taken in the clinic. She expresses a desire for better blood pressure control.    The patient denies any new medication changes and reports feeling medically well, aside from her hip and knee pain.    The patient reports otherwise doing well from a cardiovascular perspective and has not experienced any symptoms of chest discomfort, dyspnea on exertion or other breathing difficulties, palpitations, edema, lightheadedness, dizziness, pre-syncope or syncope.     She denies any other medical problems or other health-related concerns.  She is without any other symptoms or complaints.  Complete remainder of review of systems is otherwise negative and/or normal.                     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      Primary hypertension 12/10/2019    Osteoporosis 12/10/2019    Closed fracture of left distal radius 10/15/2019    Rotator cuff disorder, right 09/10/2019    Dysphagia, pharyngoesophageal phase 07/18/2019    Multiple sclerosis (HCC) 06/14/2019    GERD (gastroesophageal reflux disease) 06/14/2019    HLD (hyperlipidemia) 06/14/2019    Menopause 06/14/2019    Pulmonary nodule 06/14/2019    Thyroid nodule 06/14/2019    Coronary artery disease involving native coronary artery of native heart without angina pectoris 06/14/2019     05/03/19 and 08/07/19 CT Chest @ [facility] - moderate coronary artery calcifications seen  07/06/15 - echocardiogram @ [facility] - LVEF 50-55%, mild concentric LVH, trace tricuspid regurgitation      Macrocytosis 06/14/2019    Vitamin D deficiency 06/14/2019    Neuropathy 06/14/2019    Spinal stenosis of lumbar region without neurogenic claudication 06/14/2019  Other idiopathic scoliosis, thoracolumbar region 06/14/2019    Trauma 05/03/2019    Pubic ramus fracture (HCC) 05/03/2019    Pubic ramus fracture, right, sequela 05/03/2019    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       Review of Systems  General: negative/normal.  Eyes:  negative/normal.  Ears/Nose/Throat:  negative/normal.  Cardiovascular:  negative/normal.  Respiratory: negative/normal.    Gastrointestinal:  negative/normal.  Genitourinary:  negative/normal.  Musculoskeletal: Lower back, right hip and right knee pain  Skin: negative/normal.   Neurologic:  negative/normal.  Psychiatric:  negative/normal.  Endocrine:  negative/normal.  Heme/Lymphatic: negative/normal.  Allergic/Immunologic:  negative/normal.    Vitals:    12/12/22 0850 12/12/22 0857   BP:  (!) 164/90   BP Source:  Arm, Right Upper   Pulse:  66   SpO2:  94%   O2 Device: None (Room air)    PainSc:  Six   Weight:  87.7 kg (193 lb 6.4 oz)   Height:  162.6 cm (5' 4)     Body mass index is 33.2 kg/m?Marland Kitchen     Physical Exam  General Appearance: no acute distress  Skin: warm & intact  HEENT: EOMI, mucous membranes moist, oropharynx is clear  Neck Veins: difficult to assess due to body habitus  Carotid Arteries: no bruits  Chest Inspection: chest is normal in appearance  Auscultation/Percussion: lungs clear to auscultation, no rales, rhonchi, or wheezing  Cardiac Rhythm: regular rhythm & normal rate  Cardiac Auscultation: distant heart tones otherwise normal S1 & S2, no discrete extra heart sounds or rub  Murmurs: no audible cardiac murmurs   Extremities: no lower extremity edema; palpable distal pulses  Abdominal Exam: non-tender, bowel sounds+, abdominal aorta not appreciated  Liver & Spleen: difficult to assess due to body habitus  Neurologic Exam: oriented to time, place and person; no focal neurologic deficits      Cardiovascular Studies      Cardiovascular Health Factors  Vitals BP Readings from Last 3 Encounters: 12/12/22 (!) 164/90   11/28/22 111/52   11/08/22 (!) 141/79     Wt Readings from Last 3 Encounters:   12/12/22 87.7 kg (193 lb 6.4 oz)   10/25/22 85.7 kg (188 lb 15 oz)   10/11/22 85.7 kg (189 lb)     BMI Readings from Last 3 Encounters:   12/12/22 33.20 kg/m?   10/25/22 33.98 kg/m?   10/11/22 34.02 kg/m?      Smoking Social History     Tobacco Use   Smoking Status Former    Current packs/day: 0.00    Average packs/day: 1 pack/day for 15.0 years (15.0 ttl pk-yrs)    Types: Cigarettes    Start date: 03/01/1995    Quit date: 02/28/2005    Years since quitting: 17.7   Smokeless Tobacco Never      Lipid Profile Cholesterol   Date Value Ref Range  Status   11/30/2022 156 <200 MG/DL Final     HDL   Date Value Ref Range Status   11/30/2022 73 >40 MG/DL Final     LDL   Date Value Ref Range Status   11/30/2022 58 <100 mg/dL Final     Triglycerides   Date Value Ref Range Status   11/30/2022 116 <150 MG/DL Final      Blood Sugar No results found for: HGBA1C  Glucose   Date Value Ref Range Status   11/30/2022 87 70 - 100 MG/DL Final   16/11/9602 540 (H) 70 - 100 MG/DL Final   98/12/9145 829 (H) 70 - 100 MG/DL Final         Latest Reference Range & Units 11/30/22 11:18   Sodium 137 - 147 MMOL/L 139   Potassium 3.5 - 5.1 MMOL/L 4.6   Chloride 98 - 110 MMOL/L 100   CO2 21 - 30 MMOL/L 28   Anion Gap 3 - 12  11   Blood Urea Nitrogen 7 - 25 MG/DL 19   Creatinine 0.4 - 5.62 MG/DL 1.30   eGFR >86 mL/min >60   Glucose 70 - 100 MG/DL 87   Albumin 3.5 - 5.0 G/DL 4.2   Calcium 8.5 - 57.8 MG/DL 46.9   Total Bilirubin 0.2 - 1.3 MG/DL 0.5   Total Protein 6.0 - 8.0 G/DL 7.5   AST (SGOT) 7 - 40 U/L 17   ALT (SGPT) 7 - 56 U/L 12   Alk Phosphatase 25 - 110 U/L 60   Cholesterol <200 MG/DL 629   Triglycerides <528 MG/DL 413   HDL >24 MG/DL 73   LDL <401 mg/dL 58   VLDL MG/DL 23   Non HDL Cholesterol MG/DL 83         Assessment & Plan   77 y.o. female patient with the following medical problems:    Subclinical coronary atherosclerosis based on calcific coronary artery atherosclerosis identified on CT chest.  Aortic valve sclerosis with mild regurgitation. No stenosis.   Former tobacco use.  Essential hypertension.   Hyperlipidemia.  Left breast cancer s/p chemoradiation therapy.   Peripheral neuropathy.  Large paraesophageal hernia s/p laparoscopic repair with Nissen fundoplasty and implantation of mesh (10/13/2021).        Coronary Atherosclerosis  Stable with no history of myocardial infarction, arrhythmias or heart failure.   -No new ECG changes today.   -LDL controlled at 58 on rosuvastatin.  -Continue aspirin, myocarditis, and rosuvastatin as part of secondary CAD prevention.    Essential Hypertension  -The patient's blood pressure today is 164/90 mmHg.  -Patient reports home readings of 130s/70s. Currently on Micardis 20mg .  -Blood pressure goal is <130/80 mmHg.   -There is no history of chronic kidney disease, clinical heart failure, TIA or stroke.  -Volume status is stable on exam today with no recent clinical features of heart failure.   -Echo to evaluate for hypertensive heart disease.  -Advise patient to monitor blood pressure more frequently (daily if possible).  -Reevaluate Micardis dosage in 4-6 weeks based on home readings and echo results.    Aortic Valve Sclerosis with Mild Regurgitation  No new symptoms reported. It has been a few years since last echocardiogram.  -Schedule echocardiogram to assess any changes in valve function or structure.    Right Hip and Knee Pain  Severe, limiting activity. Patient scheduled for hip bursa injection on 12/20/2022.  -No changes to current management plan.    General Health Maintenance  -  Continue current medications including Micardis for hypertension and rosuvastatin for dyslipidemia.  -Follow-up after hip bursa injection and echocardiogram results.       I appreciate the opportunity being involved in this very nice patient's care.  Please feel free to contact me if you have any additional questions or further insights into her care.            Current Medications (including today's revisions)   acetaminophen SR(+) (TYLENOL) 650 mg tablet Take two tablets by mouth twice daily. Two tablets in the morning and three at night as needed for pain    alendronate (FOSAMAX) 70 mg tablet Take one tablet by mouth every 7 days. Take at least 30 minutes before breakfast with plain water. Do not lie down for 30 minutes.    aspirin 81 mg chewable tablet Chew one tablet by mouth daily.    ergocalciferol (vitamin D2) (VITAMIN D PO) Take  by mouth.    ferrous sulfate 325 mg (65 mg iron) tablet Take one tablet by mouth daily. (Patient taking differently: Take one tablet by mouth at bedtime daily.)    gabapentin (NEURONTIN) 300 mg capsule Take 3 caps twice a day.  Indications: neuropathic pain    hyoscyamine (ANASPAZ) 0.125 mg rapid dissolve tablet Place one tablet under tongue every 6 hours.    lidocaine (LIDODERM) 5 % topical patch Apply one patch topically to affected area daily. Apply patch for 12 hours, then remove for 12 hours before repeating.    MULTIVITAMIN PO Take 1 tablet by mouth daily.    ondansetron (ZOFRAN ODT) 4 mg rapid dissolve tablet Dissolve one tablet by mouth every 6 hours as needed. Place on tongue to dissolve.    oxyCODONE (ROXICODONE) 5 mg tablet Take one tablet by mouth every 4 hours as needed.    pantoprazole DR (PROTONIX) 20 mg tablet Take one tablet by mouth daily.    polyethylene glycol 3350 (MIRALAX) 17 g packet Take one packet by mouth daily.    rosuvastatin (CRESTOR) 10 mg tablet Take one tablet by mouth at bedtime daily.    sennosides-docusate sodium (SENNA PLUS) 8.6/50 mg tablet Take one tablet by mouth daily.    simethicone (MYLICON) 80 mg chew tablet Chew one tablet by mouth every 6 hours as needed.    telmisartan (MICARDIS) 20 mg tablet Take one tablet by mouth daily.          Total time spent on today's office visit was 40 minutes.  This includes face-to-face in person visit with patient including counseling & educating as well as nonface-to-face time including preparing to see the patient, obtaining and/or reviewing separately obtained history, performing a medically appropriate examination and/or evaluation, review of the electronic medical records, labs, pertinent radiologic studies, independent interpretation of cardiovascular studies as outlined above, formulation of treatment plan, patient care coordination, after visit summary, future disposition, and documenting clinical information in the electronic health record.

## 2022-12-13 DIAGNOSIS — R0989 Other specified symptoms and signs involving the circulatory and respiratory systems: Secondary | ICD-10-CM

## 2022-12-13 DIAGNOSIS — I2510 Atherosclerotic heart disease of native coronary artery without angina pectoris: Secondary | ICD-10-CM

## 2022-12-19 ENCOUNTER — Encounter: Admit: 2022-12-19 | Discharge: 2022-12-19 | Payer: MEDICARE

## 2022-12-20 ENCOUNTER — Encounter: Admit: 2022-12-20 | Discharge: 2022-12-20 | Payer: MEDICARE

## 2022-12-21 ENCOUNTER — Encounter: Admit: 2022-12-21 | Discharge: 2022-12-21 | Payer: MEDICARE

## 2022-12-22 ENCOUNTER — Encounter: Admit: 2022-12-22 | Discharge: 2022-12-22 | Payer: MEDICARE

## 2022-12-22 ENCOUNTER — Ambulatory Visit: Admit: 2022-12-22 | Discharge: 2022-12-22 | Payer: MEDICARE

## 2022-12-22 DIAGNOSIS — M2550 Pain in unspecified joint: Secondary | ICD-10-CM

## 2022-12-22 DIAGNOSIS — R011 Cardiac murmur, unspecified: Secondary | ICD-10-CM

## 2022-12-22 DIAGNOSIS — S92902A Unspecified fracture of left foot, initial encounter for closed fracture: Secondary | ICD-10-CM

## 2022-12-22 DIAGNOSIS — M25559 Pain in unspecified hip: Secondary | ICD-10-CM

## 2022-12-22 DIAGNOSIS — K219 Gastro-esophageal reflux disease without esophagitis: Secondary | ICD-10-CM

## 2022-12-22 DIAGNOSIS — G629 Polyneuropathy, unspecified: Secondary | ICD-10-CM

## 2022-12-22 DIAGNOSIS — IMO0002 Ulcer: Secondary | ICD-10-CM

## 2022-12-22 DIAGNOSIS — I10 Essential (primary) hypertension: Secondary | ICD-10-CM

## 2022-12-22 DIAGNOSIS — Z9221 Personal history of antineoplastic chemotherapy: Secondary | ICD-10-CM

## 2022-12-22 DIAGNOSIS — G35 Multiple sclerosis: Secondary | ICD-10-CM

## 2022-12-22 DIAGNOSIS — S62109A Fracture of unspecified carpal bone, unspecified wrist, initial encounter for closed fracture: Secondary | ICD-10-CM

## 2022-12-22 DIAGNOSIS — C801 Malignant (primary) neoplasm, unspecified: Secondary | ICD-10-CM

## 2022-12-22 DIAGNOSIS — M4800 Spinal stenosis, site unspecified: Secondary | ICD-10-CM

## 2022-12-22 DIAGNOSIS — M1990 Unspecified osteoarthritis, unspecified site: Secondary | ICD-10-CM

## 2022-12-22 DIAGNOSIS — Z1231 Encounter for screening mammogram for malignant neoplasm of breast: Secondary | ICD-10-CM

## 2022-12-22 DIAGNOSIS — H269 Unspecified cataract: Secondary | ICD-10-CM

## 2022-12-22 DIAGNOSIS — M549 Dorsalgia, unspecified: Secondary | ICD-10-CM

## 2022-12-22 MED ORDER — TRIAMCINOLONE ACETONIDE 40 MG/ML IJ SUSP
40 mg | Freq: Once | EPIDURAL | 0 refills | Status: CP
Start: 2022-12-22 — End: ?

## 2022-12-22 MED ORDER — LIDOCAINE (PF) 10 MG/ML (1 %) IJ SOLN
2 mL | Freq: Once | INTRAMUSCULAR | 0 refills | Status: CP
Start: 2022-12-22 — End: ?

## 2022-12-22 MED ORDER — IOHEXOL 300 MG IODINE/ML IV SOLN
1 mL | Freq: Once | 0 refills | Status: CP
Start: 2022-12-22 — End: ?

## 2022-12-22 MED ORDER — BUPIVACAINE (PF) 0.25 % (2.5 MG/ML) IJ SOLN
5 mL | Freq: Once | INTRAMUSCULAR | 0 refills | Status: CP
Start: 2022-12-22 — End: ?

## 2022-12-22 NOTE — Procedures
Attending Surgeon: Lizbeth Bark, MD    Anesthesia: Local    Pre-Procedure Diagnosis:   1. Greater trochanteric pain syndrome        Post-Procedure Diagnosis:   1. Greater trochanteric pain syndrome        Large Joint Drain/Inject (Procedure): R greater trochanteric bursa    Consent:   Consent obtained: verbal and written  Consent given by: patient  Risks discussed: bleeding, damage to surrounding structures, hyperglycemia, infection, pain and skin discoloration  Alternatives discussed: alternative treatment and no treatment  Discussed with patient the purpose of the treatment/procedure, other ways of treating my condition, including no treatment/ procedure and the risks and benefits of the alternatives. Patient has decided to proceed with treatment/procedure.        Universal Protocol:  Relevant documents: relevant documents present and verified  Test results: test results available and properly labeled  Imaging studies: imaging studies available  Required items: required blood products, implants, devices, and special equipment available  Site marked: the operative site was marked  Patient identity confirmed: Patient identify confirmed verbally with patient.        Time out: Immediately prior to procedure a time out was called to verify the correct patient, procedure, equipment, support staff and site/side marked as required      Procedures Details:  Procedure Peformed: Injection Only  Indications: pain  Details:Prep: 2% chlorhexidine   22 G needle, fluoroscopy-guided       lateral approachOutcome: tolerated well, no immediate complications  Comments: DESCRIPTION OF PROCEDURE:  The procedure risks and benefits were explained and informed consent was obtained from the patient.     Attention was then taken to the right greater trochanter.  The patient was placed in the left lateral decubitus position, and the right greater trochanter was palpated and marked. The skin was prepped with betadine times 3 and draped in aseptic fashion. The C-arm was rotated to the AP view. Skin and subcutaneous tissue was anesthetized using 3 mL of 1 percent lidocaine with a 25-gauge needle. A 3.5 inch 25 gauge spinal needle was advanced perpendicular to the xray beam towards the right greater trochanter. After contact with bone, a total of 0.2 mL of isovue contrast dye was injected. Bursal spread was seen, and after negative aspiration a solution containing 40 mg triamcinolone and 2 mL of 0.5 percent bupivacaine was injected. The stylet was reinserted and the needle was then removed.     The patient tolerated the procedure well was brought to the recovery room for observation in stable condition and discharged with written discharge instructions.      Plan of care: The patient is to follow up in the [facility] in 2-3 weeks.     The patient advised to contact [facility] for any of the following:   Fever, chills, or night sweats.  New onset severe sharp pain.  Any new upper or lower extremity weakness or numbness.  Any questions regarding the procedure.     If unable to contact [facility], the patient instructed a local emergency room.     Estimated blood loss: none or minimal  Specimens: none  Patient tolerated the procedure well with no immediate complications. Pressure was applied, and hemostasis was accomplished.  Administrations This Visit       bupivacaine PF (MARCAINE) 0.25 % injection 5 mL       Admin Date  12/22/2022 Action  Given Dose  5 mL Route  Injection Documented By  Lizbeth Bark, MD              iohexoL (OMNIPAQUE-300) 300 mg/mL injection 1 mL       Admin Date  12/22/2022 Action  Given Dose  1 mL Route  SEE ADMIN INSTRUCTIONS Documented By  Lizbeth Bark, MD              lidocaine PF 1% (10 mg/mL) injection 2 mL       Admin Date  12/22/2022 Action  Given Dose  2 mL Route  Injection Documented By  Lizbeth Bark, MD              triamcinolone acetonide [facility]) injection 40 mg       Admin Date  12/22/2022 Action  Given Dose  40 mg Route  Epidural Documented By  Lizbeth Bark, MD

## 2022-12-22 NOTE — Progress Notes
SPINE CENTER  INTERVENTIONAL PAIN PROCEDURE HISTORY AND PHYSICAL    No chief complaint on file.      HISTORY OF PRESENT ILLNESS:  Rachael Black is a 77 y.o. year old female who presents for injection.  Denies fevers, chills, or recent hospitalizations.  Patient denies currently taking blood thinning medications.       Past Medical History:   Diagnosis Date    Arthritis     Back pain     Cancer (HCC) 06/2015    Breast ---Left    Cataract 2014    Foot fracture, left 09/26/2016    GERD (gastroesophageal reflux disease)     Heart murmur 1995    History of chemotherapy     neoadjuvant chemotherapy 2017    Hypertension     Joint pain 2017    Multiple sclerosis (HCC) 1990    Neuropathy     Spinal stenosis 2022    Ulcer 2015    Wrist fracture 09/26/2016    heads of ulna and radius       Surgical History:   Procedure Laterality Date    HX RHINOPLASTY  1974    HX TUBAL LIGATION  1976    ANKLE SURGERY Right 2000    with instrumentation    Left radioactive seed localized lumpectomy, left axillary sentinel lymph node biopsy  Left 12/14/2015    Performed by Cordelia Poche, MD at [facility] OR    Left distal radius and ulna open reduction internal fixation Left 10/03/2019    Performed by Desiree Hane, MD at [facility] OR    Left distal radius and ulna open reduction internal fixation Left 10/03/2019    Performed by Desiree Hane, MD at [facility] OR    COLONOSCOPY  08/13/2021    repeat in 5-10 years    COLONOSCOPY DIAGNOSTIC WITH SPECIMEN COLLECTION BY BRUSHING/ WASHING - FLEXIBLE N/A 08/13/2021    Performed by Amanda Cockayne, MD at [facility] OR    ESOPHAGOGASTRODUODENOSCOPY WITH BIOPSY - FLEXIBLE N/A 08/13/2021    Performed by Amanda Cockayne, MD at [id number] OR    LAPAROSCOPIC REPAIR PARAESOPHAGEAL HERNIA WITH/ WITHOUT FUNDOPLASTY AND IMPLANTATION OF MESH N/A 10/13/2021    Performed by Rudean Haskell, MD at [facility] OR    FOOT SURGERY Left     HX WRIST FRACTURE SURGERY  2021    PR LAPAROSCOPY SURG RPR INITIAL INGUINAL HERNIA         family history includes Arthritis in her mother; Arthritis-osteo in her mother; Cancer in her maternal aunt, maternal grandmother, maternal uncle, and paternal uncle; Cancer-Colon in her paternal grandmother; Company secretary (age of onset: 50) in her maternal grandmother; Heart Disease in her father; Heart problem in her father; High Cholesterol in her father; Osteoporosis in her mother; Stroke in her maternal grandmother; Thyroid Disease in her mother.    Social History     Socioeconomic History    Marital status: Married   Tobacco Use    Smoking status: Former     Current packs/day: 0.00     Average packs/day: 1 pack/day for 15.0 years (15.0 ttl pk-yrs)     Types: Cigarettes     Start date: 03/01/1995     Quit date: 02/28/2005     Years since quitting: 17.8    Smokeless tobacco: Never   Vaping Use    Vaping status: Never Used   Substance and Sexual Activity    Alcohol use: Yes     Alcohol/week:  10.0 standard drinks of alcohol     Types: 10 Drinks containing 0.5 oz of alcohol per week    Drug use: No    Sexual activity: Yes     Partners: Male     Birth control/protection: Post-menopausal   Social History Narrative    Patient [place] husband in town ([place] 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        Allergies   Allergen Reactions    Sulfa (Sulfonamide Antibiotics) HIVES and SHORTNESS OF BREATH       Vitals:    12/22/22 1523   BP: 133/85   BP Source: Arm, Right Upper   Pulse: 79   Temp: 36.9 ?C (98.4 ?F)   SpO2: 93%   O2 Device: None (Room air)   Weight: 86.2 kg (190 lb)   Height: 165.1 cm (5' 5)        Oswestry Total Score:: 50    REVIEW OF SYSTEMS: 10 point ROS obtained and negative except right hip pain      PHYSICAL EXAM:  General: 76 y.o. female appears stated age, in no acute distress  HEENT: Normocephalic, atraumatic  Neck: No thyroidmegaly  Cardiovascular: Well perfused  Pulmonary: Unlabored respirations  Extremities: No cyanosis, clubbing, or edema  Skin: No lesions seen on exposed skin  Psychiatric:  Appropriate mood and affect  Musculoskeletal: No atrophy.   Neurologic: Antigravity strength in all extremities. CN II -XII grossly intact.  Alert and oriented x 3.           IMPRESSION:  No diagnosis found.     PLAN: RIGHT greater trochanteric bursa injection under fluoroscopy

## 2022-12-22 NOTE — Discharge Instructions - Supplementary Instructions
GENERAL POST PROCEDURE INSTRUCTIONS  Physician: _________________________________  Procedure Completed Today:  Joint Injection (hip, knee, shoulder)  Cervical Epidural Steroid Injection  Cervical Transforaminal Steroid Injection  Trigger Point Injection  Caudal Epidural Steroid Injection  Piriformis Injection  Pudendal Nerve Block  Other _____________________ Thoracic Epidural Steroid Injection  Lumbar Epidural Steroid Injection  Lumbar Transforaminal Steroid Injection  Facet Joint Injection  Celiac Nerve Block  Sacrococcygeal  Sacroiliac Joint Injection   Important information following your procedure today:  You may drive today     If you had sedation, you may NOT drive today  Rest at home for the next 6 hours.  You may then begin to resume your normal activities.  DO NOT drive any vehicle, operate any power tools, drink alcohol, make any major decisions, or sign any legal documents for the next 12 hours.  Pain relief may not be immediate. It is possible you may even experience an increase in pain during the first 24-48 hours followed by a gradual decrease of your pain.  Though the procedure is generally safe, and complications are rare, we do ask that you be aware of any of the following:  Any swelling, persistent redness, new bleeding or drainage from the site of the injection.  You should not experience a severe headache.  You should not run a fever over 101oF.  New onset of sharp, severe back and or neck pain.  New onset of upper or lower extremity numbness or weakness.  New difficulty controlling bowel or bladder function after injection.  New shortness of breath.  ** If any of these occur, please call to report this occurrence to the nurse of Dr. Noralyn Pick at [phone]. If you are calling after 4:00 p.m. or on weekends or holidays, please call [phone] and ask to have the resident physician on call for the physician paged or go to your local emergency room.  You may experience soreness at the injection site. Ice can be applied at 20-minute intervals for the first 24 hours. The following day you may alternate ice with heat if you are experiencing muscle tightness, otherwise continue with ice. Ice works best at decreasing pain. Avoid application of direct heat, hot showers or hot tubs today.  Avoid strenuous activity today. You many resume your regular activities and exercise tomorrow.  Patients with diabetes may see an elevation in blood sugars for 7-10 days after the injection. It is important to pay close attention to your diet, check your blood sugars daily and report extreme elevations to the physician that manages your diabetes.  Patients taking daily blood thinners can resume their regular dose this evening.  It is important that you take all medications ordered by your pain physician. Taking medications as ordered is an important part of your pain care plan. If you cannot continue the medication plan, please notify the physician.    Possible side effects to steroids that may occur:  Flushing or redness of the face  Irritability  Fluid retention  Change in women's menses  Minor headache    If you are unable to keep your upcoming appointment, please notify the Spine Center scheduler at [phone] at least 24 hours in advance. If you have questions for the surgery center, call [facility] at [phone].

## 2023-01-09 ENCOUNTER — Encounter: Admit: 2023-01-09 | Discharge: 2023-01-09 | Payer: MEDICARE

## 2023-01-09 ENCOUNTER — Ambulatory Visit: Admit: 2023-01-09 | Discharge: 2023-01-10 | Payer: MEDICARE

## 2023-01-17 ENCOUNTER — Encounter: Admit: 2023-01-17 | Discharge: 2023-01-17 | Payer: MEDICARE

## 2023-01-30 ENCOUNTER — Ambulatory Visit: Admit: 2023-01-30 | Discharge: 2023-01-30 | Payer: MEDICARE

## 2023-01-30 ENCOUNTER — Encounter: Admit: 2023-01-30 | Discharge: 2023-01-30 | Payer: MEDICARE

## 2023-01-31 ENCOUNTER — Encounter: Admit: 2023-01-31 | Discharge: 2023-01-31 | Payer: MEDICARE

## 2023-01-31 ENCOUNTER — Ambulatory Visit: Admit: 2023-01-31 | Discharge: 2023-02-01 | Payer: MEDICARE

## 2023-01-31 NOTE — Telephone Encounter
Patient sent below update on BP readings staying consistently high 140/70+. Patient is taking HCTZ 12.5 mg daily and Telmisartan 20 mg daily. Dr. Jacqulyn Ducking saw the patient 10/14 and note states, Reevaluate Micardis dosage in 4-6 weeks based on home readings and echo results.    Echo 11/11: EF 65%. The left ventricular systolic function is normal. There are no segmental wall motion abnormalities. Normal left ventricular diastolic function. Normal left atrial pressure. The right ventricular size is normal. The right ventricular systolic function is normal. There is mild mitral annular calcification without stenosis. Trace MR, trace TR. Moderate aortic valve regurgitation. Mild pulmonary hypertension, estimated PAP = 39 mmHg.       blood pressure check    Rachael Black, RN6 minutes ago (1:00 PM)   It?s consistently a little high.  It?s been 140/70+   I?ll start writing it down for you.    Rachael Dice, RN  Rachael Schaumann Burges GenieYesterday (9:35 AM)   [place]!  Do you mean consistently normal?  What does it normally run?    Rachael Black, RN2 days ago   My pressure has been pretty consistent.    Rachael Dice, RN  Rachael Rae Goldblatt [name]2 weeks ago   Hi Black!   I'm reaching out to see how your blood pressures have been doing at home.  I know at the office visit it was a little high - Dr. Jacqulyn Ducking just wants to make sure we don't need to increase your BP medicine at all.  Can you reply with a list of home blood pressure readings?  I'll send those to Dr. Jacqulyn Ducking and see what he thinks about your echo results, and will get back to you.   Thanks!! [facility] [employer]

## 2023-03-13 ENCOUNTER — Encounter: Admit: 2023-03-13 | Discharge: 2023-03-13 | Payer: MEDICARE

## 2023-03-13 ENCOUNTER — Ambulatory Visit: Admit: 2023-03-13 | Discharge: 2023-03-13 | Payer: MEDICARE

## 2023-04-07 ENCOUNTER — Encounter: Admit: 2023-04-07 | Discharge: 2023-04-07 | Payer: MEDICARE

## 2023-06-09 ENCOUNTER — Encounter: Admit: 2023-06-09 | Discharge: 2023-06-09 | Payer: MEDICARE

## 2023-06-09 NOTE — Telephone Encounter
 I spoke with [name] who is asking for telmisartan  to be refilled.  I let her know that I reached out to her pharmacy at [facility] and confirmed that they have  3 month refill waiting to be picked up.  Patient confirmed understanding.

## 2023-06-12 ENCOUNTER — Ambulatory Visit: Admit: 2023-06-12 | Discharge: 2023-06-12 | Payer: MEDICARE

## 2023-06-12 ENCOUNTER — Encounter: Admit: 2023-06-12 | Discharge: 2023-06-12 | Payer: MEDICARE

## 2023-06-13 ENCOUNTER — Encounter: Admit: 2023-06-13 | Discharge: 2023-06-13 | Payer: MEDICARE

## 2023-06-24 ENCOUNTER — Encounter: Admit: 2023-06-24 | Discharge: 2023-06-24 | Payer: MEDICARE

## 2023-06-24 ENCOUNTER — Ambulatory Visit: Admit: 2023-06-24 | Discharge: 2023-06-25 | Payer: MEDICARE

## 2023-06-24 DIAGNOSIS — N3000 Acute cystitis without hematuria: Secondary | ICD-10-CM

## 2023-06-24 DIAGNOSIS — R300 Dysuria: Secondary | ICD-10-CM

## 2023-06-24 MED ORDER — NITROFURANTOIN MONOHYD/M-CRYST 100 MG PO CAP
100 mg | ORAL_CAPSULE | Freq: Two times a day (BID) | ORAL | 0 refills | 7.00000 days | Status: AC
Start: 2023-06-24 — End: ?

## 2023-06-24 NOTE — Progress Notes
 Date of Service: 06/24/2023    Rachael Black Rachael Black is a 78 y.o. female.  DOB: 1946-01-24  MRN: [id number]     Subjective:             History of Present Illness  Pleasant 78 year old here with her husband.  No distress.  She has typical lower urinary tract symptoms and a urine today showed leukocytes and nitrites.  No upper tract signs.  Complex history including MS when she is doing fine without treatment long-term now.  She does follow-up with cardiology with neurology.  She has a bad hip that she is thinking about having addressed.  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       Review of Systems  Recent visits with primary.  Her annual wellness visit with cardiology all reviewed.  Again doing well with underlying MS diagnosis currently not treated  Denies flank pain upper tract signs nausea vomiting  Objective:          acetaminophen SR(+) (TYLENOL) 650 mg tablet Take two tablets by mouth twice daily. Two tablets in the morning and three at night as needed for pain    alendronate (FOSAMAX) 70 mg tablet Take one tablet by mouth every 7 days. Take at least 30 minutes before breakfast with plain water. Do not lie down for 30 minutes.    aspirin 81 mg chewable tablet Chew one tablet by mouth daily. (Patient not taking: Reported on 06/24/2023)    ergocalciferol (vitamin D2) (VITAMIN D PO) Take  by mouth.    ferrous sulfate 325 mg (65 mg iron) tablet Take one tablet by mouth daily.    gabapentin (NEURONTIN) 300 mg capsule Take 3 caps twice a day.  Indications: neuropathic pain    hydroCHLOROthiazide (HYDRODIURIL) 25 mg tablet Take one tablet by mouth every morning.    lidocaine (LIDODERM) 5 % topical patch Apply one patch topically to affected area daily. Apply patch for 12 hours, then remove for 12 hours before repeating. (Patient not taking: Reported on 06/24/2023)    Miscellaneous Medical Supply misc RSV vaccination (AREXVY)    MULTIVITAMIN PO Take 1 tablet by mouth daily.    pantoprazole DR (PROTONIX) 20 mg tablet Take one tablet by mouth daily.    rosuvastatin (CRESTOR) 10 mg tablet Take one tablet by mouth at bedtime daily.    telmisartan (MICARDIS) 20 mg tablet Take one tablet by mouth twice daily.     Vitals:    06/24/23 1229   BP: 112/75   BP Source: Arm, Right Upper   Pulse: 88   Temp: 97.2 ?F (36.2 ?C)   Resp: 18   SpO2: 95%   TempSrc: Temporal   PainSc: Two   Weight: 90.7 kg (200 lb)   Height: 162.6 cm (5' 4)     Body mass index is 34.33 kg/m?Rachael Black     Physical Exam  Pleasant bright good spirits.  No distress.  Good color.  Normal blood pressure afebrile.    Urine shows leukocytes and nitrites.  Blood.       Assessment and Plan:  Luvenia Hearing. Loriane Eisenhauer was seen today for urinary pain.    Diagnoses and all orders for this visit:    Dysuria  -     POC URINE DIPSTICK AUTO READ  -     CULTURE-URINE W/SENSITIVITY; Future; Expected date: 06/24/2023    Acute cystitis without hematuria  -     CULTURE-URINE W/SENSITIVITY; Future; Expected date: 06/24/2023  Other orders  -     nitrofurantoin monohyd/m-cryst (MACROBID) 100 mg capsule; Take one capsule by mouth every 12 hours for 5 days. Take with food.  Indications: genitourinary tract infections    Discussed culture pending begin Macrobid.  Follow-up PCP Monday if not already quickly improving or any upper tract signs or other concerns.  She agrees to this plan.  Good talk.  Dismissed in no distress.

## 2023-06-25 ENCOUNTER — Encounter: Admit: 2023-06-25 | Discharge: 2023-06-25 | Payer: MEDICARE

## 2023-07-03 ENCOUNTER — Encounter: Admit: 2023-07-03 | Discharge: 2023-07-03 | Payer: MEDICARE

## 2023-07-04 ENCOUNTER — Emergency Department: Admit: 2023-07-04 | Discharge: 2023-07-03 | Payer: MEDICARE

## 2023-07-04 ENCOUNTER — Emergency Department: Admit: 2023-07-04 | Discharge: 2023-07-04 | Payer: MEDICARE

## 2023-07-04 ENCOUNTER — Encounter: Admit: 2023-07-04 | Discharge: 2023-07-04 | Payer: MEDICARE

## 2023-07-04 ENCOUNTER — Emergency Department: Admit: 2023-07-04 | Discharge: 2023-07-04 | Disposition: A | Discharge status: Home / Self Care | Payer: MEDICARE

## 2023-07-04 DIAGNOSIS — S52591A Other fractures of lower end of right radius, initial encounter for closed fracture: Secondary | ICD-10-CM

## 2023-07-04 NOTE — Telephone Encounter
 LM for patient to schedule ER FU.

## 2023-07-07 ENCOUNTER — Ambulatory Visit: Admit: 2023-07-07 | Discharge: 2023-07-07 | Payer: MEDICARE

## 2023-07-07 ENCOUNTER — Encounter: Admit: 2023-07-07 | Discharge: 2023-07-07 | Payer: MEDICARE

## 2023-07-07 NOTE — Telephone Encounter
 ED Discharge Follow Up  Reached patient: Yes, Direct Clinic Contact- seen by Ortho 07/07/23  Patient Date of Birth: 10-19-1945  Admission Information  [facility]  ED Admission Date: 07/04/23   ED Discharge Date: 07/04/23   Admission Diagnosis:  Wrist injury  Discharge Diagnosis: Other closed intra-articular fracture of distal end of right radius, initial encounter   Hospital Services: Unplanned  Today's call is 3 (calendar) days post discharge    Medication Reconciliation  Changes to pre-ED visit medications? No  Were new prescriptions filled? N/A  START taking these medications     Details   oxyCODONE (ROXICODONE) 5 mg tablet Take one tablet by mouth every 6 hours as needed for Pain., Disp-20 tablet, R-0, Normal     Meds reviewed and reconciled? Yes    Current Outpatient Medications   Medication Instructions    acetaminophen SR (TYLENOL 8 HOUR) 1,300 mg, TWICE DAILY    alendronate (FOSAMAX) 70 mg, Oral, EVERY  7 DAYS, Take at least 30 minutes before breakfast with plain water. Do not lie down for 30 minutes.    aspirin 81 mg, DAILY    ergocalciferol (vitamin D2) (VITAMIN D PO) Take  by mouth.    ferrous sulfate 325 mg, Oral, DAILY    gabapentin (NEURONTIN) 300 mg capsule Take 3 caps twice a day.    hydroCHLOROthiazide (HYDRODIURIL) 25 mg, Oral, EVERY MORNING    lidocaine (LIDODERM) 5 % topical patch 1 patch, Topical, DAILY, Apply patch for 12 hours, then remove for 12 hours before repeating.    Miscellaneous Medical Supply misc RSV vaccination (AREXVY)    MULTIVITAMIN PO 1 tablet, DAILY    oxyCODONE (ROXICODONE) 5 mg, Oral, EVERY  6 HOURS PRN    pantoprazole DR (PROTONIX) 20 mg, Oral, DAILY    rosuvastatin (CRESTOR) 10 mg, Oral, AT BEDTIME DAILY    telmisartan (MICARDIS) 20 mg, Oral, TWICE DAILY              Scheduling Follow-up Appointment  Upcoming appointments:   Future Appointments   Date Time Provider Department Center   07/19/2023  1:40 PM Euell Herrlich, DO [facility] Ortho Sports   07/26/2023  9:20 AM Robie Cho, MD [facility] Ortho Sports   10/20/2023 11:30 AM [facility] Radiolog   11/03/2023 11:00 AM Darl Edu, PA-C [facility] ENT   12/18/2023  3:00 PM Robley Chow, MD [facility] CVM Exam   01/30/2024 11:00 AM Algernon Ing, DO [facility]     When was patient?s last PCP visit: 06/12/2023  PCP primary location: [facility]  PCP appointment scheduled? No, no ED f/u  Specialist appointment scheduled? Yes, with Orthopedic seen 07/07/23  Is assistance with transportation needed? No  MyChart message sent? Active in MyChart. No message sent.   Artera text sent? No      ED Communication   Did patient call clinic prior to going to ED? No  Reason patient went to ED: Unable to obtain    Rachael Black

## 2023-07-10 ENCOUNTER — Encounter: Admit: 2023-07-10 | Discharge: 2023-07-10 | Payer: MEDICARE

## 2023-07-10 ENCOUNTER — Ambulatory Visit: Admit: 2023-07-10 | Discharge: 2023-07-10 | Payer: MEDICARE

## 2023-07-10 ENCOUNTER — Ambulatory Visit: Admit: 2023-07-10 | Discharge: 2023-07-11 | Payer: MEDICARE

## 2023-07-10 MED ORDER — ROCURONIUM 10 MG/ML IV SOLN
INTRAVENOUS | 0 refills | Status: DC
Start: 2023-07-10 — End: 2023-07-10

## 2023-07-10 MED ORDER — ONDANSETRON HCL (PF) 4 MG/2 ML IJ SOLN
INTRAVENOUS | 0 refills | Status: DC
Start: 2023-07-10 — End: 2023-07-10

## 2023-07-10 MED ORDER — FENTANYL CITRATE (PF) 50 MCG/ML IJ SOLN
INTRAVENOUS | 0 refills | Status: DC
Start: 2023-07-10 — End: 2023-07-10

## 2023-07-10 MED ORDER — LIDOCAINE (PF) 200 MG/10 ML (2 %) IJ SYRG
INTRAVENOUS | 0 refills | Status: DC
Start: 2023-07-10 — End: 2023-07-10

## 2023-07-10 MED ORDER — PHENYLEPHRINE IN 0.9% NACL (STD CONC) (PREMADE)(AM)(OR)
INTRAVENOUS | 0 refills | Status: DC
Start: 2023-07-10 — End: 2023-07-10
  Administered 2023-07-10: 20:00:00 .4 ug/kg/min via INTRAVENOUS

## 2023-07-10 MED ORDER — PROPOFOL INJ 10 MG/ML IV VIAL
INTRAVENOUS | 0 refills | Status: DC
Start: 2023-07-10 — End: 2023-07-10

## 2023-07-10 MED ORDER — DEXAMETHASONE SODIUM PHOSPHATE 4 MG/ML IJ SOLN
INTRAVENOUS | 0 refills | Status: DC
Start: 2023-07-10 — End: 2023-07-10

## 2023-07-10 MED ORDER — ARTIFICIAL TEARS (PF) SINGLE DOSE DROPS GROUP
OPHTHALMIC | 0 refills | Status: DC
Start: 2023-07-10 — End: 2023-07-10

## 2023-07-10 MED ORDER — CEFAZOLIN 2 GRAM IV SOLR
INTRAVENOUS | 0 refills | Status: DC
Start: 2023-07-10 — End: 2023-07-10

## 2023-07-10 MED ORDER — HYDROMORPHONE (PF) 2 MG/ML IJ SYRG
INTRAVENOUS | 0 refills | Status: DC
Start: 2023-07-10 — End: 2023-07-10

## 2023-07-10 MED ORDER — SUGAMMADEX 100 MG/ML IV SOLN
INTRAVENOUS | 0 refills | Status: DC
Start: 2023-07-10 — End: 2023-07-10

## 2023-07-10 MED ADMIN — OXYCODONE 5 MG PO TAB [10814]: 5 mg | ORAL | @ 22:00:00 | Stop: 2023-07-11 | NDC 00904696661

## 2023-07-10 MED ADMIN — HYDROMORPHONE (PF) 2 MG/ML IJ SYRG [163476]: 0.4 mg | INTRAVENOUS | @ 22:00:00 | Stop: 2023-07-11 | NDC 63323085303

## 2023-07-10 MED ADMIN — FENTANYL CITRATE (PF) 50 MCG/ML IJ SOLN [3037]: 25 ug | INTRAVENOUS | @ 22:00:00 | Stop: 2023-07-11 | NDC 00409909412

## 2023-07-10 MED ADMIN — HYDROMORPHONE (PF) 2 MG/ML IJ SYRG [163476]: 0.2 mg | INTRAVENOUS | @ 22:00:00 | Stop: 2023-07-11 | NDC 63323085303

## 2023-07-10 MED ADMIN — LACTATED RINGERS IV SOLP [4318]: 1000 mL | INTRAVENOUS | @ 17:00:00 | Stop: 2023-07-11 | NDC 00338011704

## 2023-07-10 MED ADMIN — ACETAMINOPHEN 500 MG PO TAB [102]: 1000 mg | ORAL | @ 19:00:00 | Stop: 2023-07-10 | NDC 50580045711

## 2023-07-10 MED ADMIN — METHOCARBAMOL 100 MG/ML IJ SOLN [4970]: 1000 mg | INTRAVENOUS | @ 23:00:00 | Stop: 2023-07-10 | NDC 55150022310

## 2023-07-10 NOTE — Anesthesia Post-Procedure Evaluation
 Post-Anesthesia Evaluation    Name: Rachael Black      MRN: [id number]     DOB: 1945-06-01     Age: 78 y.o.     Sex: female   __________________________________________________________________________     Procedure Information       Anesthesia Start Date/Time: 07/10/23 1452    Procedure: CLOSED TREATMENT DISTAL RADIAL FRACTURE/ EPIPHYSEAL SEPARATION WITH MANIPULATION WITH/ WITHOUT CLOSED TREATMENT ULNAR STYLOID FRACTURE (Right: Wrist)    Location: MAIN OR 36 / Main OR/Periop    Surgeons: Robie Cho, MD            Post-Anesthesia Vitals  BP: 143/78 (05/12 2000)  Pulse: 78 (05/12 2000)  Respirations: 20 PER MINUTE (05/12 2000)  SpO2: 95 % (05/12 2000)  O2 Device: None (Room air) (05/12 2000)   Vitals Value Taken Time   BP 143/78 07/10/23 20:00   Temp 36.1 ?C (96.9 ?F) 07/10/23 16:47   Pulse 78 07/10/23 20:00   Respirations 20 PER MINUTE 07/10/23 20:00   SpO2 95 % 07/10/23 20:00   O2 Device None (Room air) 07/10/23 20:00   ABP     ART BP           Post Anesthesia Evaluation Note    Evaluation location: Pre/Post  Patient participation: recovered; patient participated in evaluation  Level of consciousness: alert    Pain score: 3  Pain management: adequate    Hydration: normovolemia  Temperature: 36.0?C - 38.4?C  Airway patency: adequate    Perioperative Events       Post-op nausea and vomiting: no PONV    Postoperative Status  Cardiovascular status: hemodynamically stable  Respiratory status: spontaneous ventilation  Follow-up needed: none        Perioperative Events  There were no known complications for this encounter.

## 2023-07-10 NOTE — Anesthesia Procedure Notes
 Procedure: Airway Placement    AIRWAY INSERTION    Date/Time: 07/10/2023 3:03 PM    Patient location: OR  Urgency: elective  Difficult Airway: No            Airway Procedure  Indication(s) for airway management: surgery        no    Preoxygenated: yes    Neck stabilization: no in-line stabilization    Mask difficulty assessment: 1 - vent by mask      Procedure Outcome  Final airway type: endotracheal airway  Endotracheal airway: ETT          ETT size (mm): 7.0  Technique used for successful ETT placement: direct laryngoscopy  Devices/methods used in placement: intubating stylet  Insertion site: oral  Blade type: Glide Scope   Laryngoscope/Videolaryngoscope blade size: 3  Cormack-Lehane classification: grade I - full view of glottis      Measured from: lips   Depth: 21 cm  Amount of Air in Cuff: 10 ml  Number of attempts at approach: 1  Placement verified by auscultation and capnometry          Additional notes: Atraumatic, dentition unchanged      Performed by: Bonne Buster, CRNA  Authorized by: Daphne Eagles, MD

## 2023-07-11 MED ADMIN — FENTANYL CITRATE (PF) 50 MCG/ML IJ SOLN [3037]: 25 ug | INTRAVENOUS | Stop: 2023-07-11 | NDC 00409909412

## 2023-07-11 MED ADMIN — LABETALOL 5 MG/ML IV SOLN [10372]: 5 mg | INTRAVENOUS | Stop: 2023-07-11 | NDC 36000032001

## 2023-07-11 MED FILL — ONDANSETRON 4 MG PO TBDI: 4 mg | ORAL | 10 days supply | Qty: 30 | Fill #1 | Status: CP

## 2023-07-11 MED FILL — OXYCODONE 5 MG PO TAB: 5 mg | ORAL | 8 days supply | Qty: 30 | Fill #1 | Status: CP

## 2023-07-12 ENCOUNTER — Encounter: Admit: 2023-07-12 | Discharge: 2023-07-12 | Payer: MEDICARE

## 2023-07-13 ENCOUNTER — Encounter: Admit: 2023-07-13 | Discharge: 2023-07-13 | Payer: MEDICARE

## 2023-07-19 ENCOUNTER — Encounter: Admit: 2023-07-19 | Discharge: 2023-07-19 | Payer: MEDICARE

## 2023-07-19 ENCOUNTER — Ambulatory Visit: Admit: 2023-07-19 | Discharge: 2023-07-20 | Payer: MEDICARE

## 2023-07-19 DIAGNOSIS — M1611 Unilateral primary osteoarthritis, right hip: Secondary | ICD-10-CM

## 2023-07-19 MED ORDER — TRIAMCINOLONE ACETONIDE 40 MG/ML IJ SUSP
40 mg | Freq: Once | INTRAMUSCULAR | 0 refills | Status: CP | PRN
Start: 2023-07-19 — End: ?

## 2023-07-19 MED ORDER — LIDOCAINE (PF) 10 MG/ML (1 %) IJ SOLN
4 mL | Freq: Once | INTRAMUSCULAR | 0 refills | Status: CP | PRN
Start: 2023-07-19 — End: ?

## 2023-07-19 MED ORDER — ROPIVACAINE (PF) 5 MG/ML (0.5 %) IJ SOLN
4 mL | Freq: Once | INTRAMUSCULAR | 0 refills | Status: CP | PRN
Start: 2023-07-19 — End: ?

## 2023-07-19 NOTE — Patient Instructions
 1.  Activity modification: Allow pain to be your guide. Discontinue specific movements if they increase perceived pain. Rest if needed.   2.  Medication: NSAIDs (eg ibuprofen, naproxen, etc) and acetaminophen (Tylenol) as needed.   3.  Therapy: Physical therapy as prescribed. Your therapist will perform an evaluation and treat accordingly.   4.  Intervention: ultrasound guided injection today. Do not submerge injection site in water for 48 hours.  5.  Diagnostics: none today  6.  Follow up: as needed.    If you are signed up for MyChart (a secure internal messaging system), you can message me with questions or concerns. If you need assistance signing up for MyChart, please contact us .    For follow-up appointments, please call the appointment line [phone] and specify at which location you would like to be seen. If you need to fax something, please use [phone].    With questions regarding care, please call Jacqlyn Matas, ATC at [phone].    Future Appointments   Date Time Provider Department Center   07/26/2023  9:20 AM Robie Cho, MD [facility] Ortho Sports   10/20/2023 11:30 AM [facility] Radiolog   11/03/2023 11:00 AM Darl Edu, PA-C [facility] ENT   12/18/2023  3:00 PM Robley Chow, MD [facility] CVM Exam   01/30/2024 11:00 AM Algernon Ing, DO [facility] - Primary Care [phone] & Thursday 8-4:30)  [address]  [place], [place] [address]    [facility] - Primary Care [phone] 1-4:30)  [address] [place], [address]    [facility] - Specialist [phone] & Friday 8-4:30)  [address]  [place], [place] [address]    Shaun Deiters, DO, [facility]  Family Medicine & Sports Medicine  [facility] Team Physician  [facility] Team Physician

## 2023-07-19 NOTE — Procedures
 Large Joint Drain/Inject: R hip joint on 07/19/2023 1:40 PM    Consent:   Consent obtained: verbal  Consent given by: patient  Risks discussed: damage to surrounding structures, hyperglycemia, infection, pain, soft tissue reaction, tendon rupture, bleeding, vasovagal reaction, subcutaneous fat atrophy and skin discoloration  Alternatives discussed: alternative treatment, no treatment and referral  Discussed with patient the purpose of the treatment/procedure, other ways of treating my condition, including no treatment/ procedure and the risks and benefits of the alternatives. Patient has decided to proceed with treatment/procedure.        Universal Protocol:  Relevant documents: relevant documents present and verified  Imaging studies: imaging studies available  Site marked: the operative site was marked  Patient identity confirmed: Patient identify confirmed verbally with patient.        Time out: Immediately prior to procedure a time out was called to verify the correct patient, procedure, equipment, support staff and site/side marked as required      Procedures Details:  Procedure Peformed: Injection Only  Indications: pain  Details:Prep: 2% chlorhexidine  Local anesthetic: subcutaneous lidocaine 1% 10mg /mL   22 G needle, ultrasound-guided    Justification for use of ultrasound guidance: The use of direct sonographic visualization of the needle (rather than a non-guided injection) was required to ensure accurate injection placement for diagnostic specificity, to maximize clinical efficacy and for safety purposes to minimize risk of bleeding or injury to nearby neurovascular structures.     anterior approachMedications: 4 mL lidocaine PF 1% (10 mg/mL); 4 mL ROPivacaine (PF) 0.5% (5 mg/mL); 40 mg triamcinolone acetonide 40 mg/mL  Outcome: tolerated well, no immediate complications  Comments: Visit Type:  Procedure  ?  Attending Physician:  Shaun Deiters, DO    Procedure Performed:  Right ultrasound guided intra-articular hip injection    Procedure Performed By:  Attending Physician - Shaun Deiters, DO  ?  Description of Procedure: Risks, benefits and options were discussed with the patient. Informed consent was obtained: Following denial of allergy and review of potential side effects and complications including, but not limited to, infection, allergic reaction, local tissue breakdown, systemic effects of corticosteroids, elevation of blood glucose, and injury to soft tissue, tendons, cartilage and/or nerves, with emphasis on the 4-12% chance of rapid degenerative arthritis of the joint (based on Dr. Reginia Caprice study published in the Sept 2021 Journal of Bone and Joint Surgery), the patient indicated understanding and agreed to proceed with injection.?Timeout was taken prior to procedure with confirmation of the correct side. The patient laid supine. After cleaning injection site with chlorhexidine prep x2 and applying clean gloves, using a curvilinear transducer with sterile probe cover and sterile US  gel, I scanned the patient's thigh until I was able to visualize the Right femoral head and neck with overlying capsule. No vasculature was seen in the path of my planned injection track, with femoral artery and vein seen much more medial than my target site. Then, using a 25 gauge 1.5 inch needle 4cc 1% lidocaine without epinephrine to anesthetize the injection site and track. This needle was removed, and subsequently I utilized a 22-gauge 3.5 inch needle, and under ultrasound guidance, guided it to penetrate the joint capsule. Upon reaching the bone, injection of 4 cc of 0.5% ropivacaine and 1 cc 40 mg triamcinolone was completed, with visualized distension of the joint capsule. After solution was injected, the needle was removed. Key images/video were obtained and saved for future reference as needed. The patient tolerated the procedure well without  complication.    Pre Procedure Diagnosis:  Right hip osteoarthritis     Post Procedure Diagnosis:  Right hip osteoarthritis     Position of Patient:  Supine    Examination of injection site reveals no warmth, erythema, or appearance of infection.    Instructed patient not to submerge the injection sites for 48 hours or longer, but that showers are okay. Also instructed to call if they develop worsening pain, redness, swelling, warmth, fevers, or other concerning symptoms after the procedure.

## 2023-07-19 NOTE — Progress Notes
 Date of Service: 07/19/2023     Subjective:  History of Present Illness  Rachael Black is a 78 y.o. female in clinic to evaluate for right hip pain.  She is accompanied by her husband today.  She has been living for quite some time, has never had any injuries.  Met with stroke until recently when she had some pain in right hip and loss of balance, falling, fracturing her right wrist.  She is on wheelchair now as she is unable to use a cane which normally is what helps her ambulate despite the pain.  She says the pain is in the groin mostly, hurts more with weightbearing.  She is able to get in and out of bed, but her quality of life is diminished.  She denies numbness, tingling, weakness.    Objective:          acetaminophen SR(+) (TYLENOL) 650 mg tablet Take two tablets by mouth twice daily. three tablets in the morning and three at night as needed for pain    alendronate (FOSAMAX) 70 mg tablet Take one tablet by mouth every 7 days. Take at least 30 minutes before breakfast with plain water. Do not lie down for 30 minutes. (Patient taking differently: Take one tablet by mouth every 7 days. Take at least 30 minutes before breakfast with plain water. Do not lie down for 30 minutes. On Wednesday (Thursday))    aspirin 81 mg chewable tablet Chew one tablet by mouth every 12 hours. Indications: dvt ppx    ergocalciferol (vitamin D2) (VITAMIN D PO) Take  by mouth.    ferrous sulfate 325 mg (65 mg iron) tablet Take one tablet by mouth daily.    gabapentin (NEURONTIN) 300 mg capsule Take 3 caps twice a day.  Indications: neuropathic pain    hydroCHLOROthiazide (HYDRODIURIL) 25 mg tablet Take one tablet by mouth every morning.    lidocaine (LIDODERM) 5 % topical patch Apply one patch topically to affected area daily. Apply patch for 12 hours, then remove for 12 hours before repeating. (Patient taking differently: Apply one patch topically to affected area as Needed. Apply patch for 12 hours, then remove for 12 hours before repeating.)    Miscellaneous Medical Supply misc RSV vaccination (AREXVY)    MULTIVITAMIN PO Take 1 tablet by mouth daily.    ondansetron (ZOFRAN ODT) 4 mg rapid dissolve tablet Dissolve one tablet by mouth every 8 hours as needed for Nausea or Vomiting. Place on tongue to dissolve.  Indications: prevent nausea and vomiting after surgery    oxyCODONE (ROXICODONE) 5 mg tablet Take one tablet by mouth every 6 hours as needed for Pain. Indications: pain    pantoprazole DR (PROTONIX) 20 mg tablet Take one tablet by mouth daily.    rosuvastatin (CRESTOR) 10 mg tablet Take one tablet by mouth at bedtime daily.    sennosides-docusate sodium (SENOKOT-S) 8.6/50 mg tablet Take one tablet by mouth twice daily. While taking narcotic pain medication  Indications: constipation    telmisartan (MICARDIS) 20 mg tablet Take one tablet by mouth twice daily.     Vitals:    07/19/23 1324   Resp: 18   SpO2: 99%   Weight: 89.4 kg (197 lb)   Height: 162.6 cm (5' 4)     Body mass index is 33.81 kg/m?[name]   Physical Exam  General: No acute distress, cooperative, conversive  HEENT: atraumatic, normocephalic  CV: good perfusion of extremities  Lungs: Nonlabored respirations with symmetrical chest expansion  Abdomen:  Nondistended  Skin: no rash, no appreciable cyanosis  Psych: Appropriate affect  MSK:  Bilateral hip exam  Inspection: No gross deformity - no erythema, swelling, or discoloration  Gait/toe & heel walk/stance/squatting/hopping: Patient is not ambulatory today, as she is in a wheelchair due to her recent fall  Palpation: no point tenderness over proximal thigh, greater trochanter, SI joint, iliac crest, ASIS, PSIS, pubic symphysis, or gluteal/piriformis muscles  ROM/Strength/Special Tests:  Supine 90 degrees IR/ER: Right Limited/limited;  Left full/full (with pain on internal rotation on the right)  Flexion/Extension: Right Limited/limited;  Left full/full  Strength - Abd/Add/Flex/Ext: Right 4+/5, 5/5, 5/5  Left 4+/5, 5/5, 5/5  C-sign: Right negative  Left negative  Log roll: Right positive left negative  FADIR/Anterior Impingement: Right positive left negative    Neurovascular exam:    Right sensation intact, 2+/4 DP/PT pulses with brisk capillary refill   Left sensation intact, 2+/4 DP/PT pulses with brisk capillary refill    Assessment and Plan:  1. 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 Elvin Hammer to discuss THA  - She will follow-up with us  as needed moving forward otherwise  - AMB REFERRAL TO ORTHOPEDIC SURGERY/ SPORTS MEDICINE  - POC US  NO READ; Future    No follow-ups on file.     Patient Instructions   1.  Activity modification: Allow pain to be your guide. Discontinue specific movements if they increase perceived pain. Rest if needed.   2.  Medication: NSAIDs (eg ibuprofen, naproxen, etc) and acetaminophen (Tylenol) as needed.   3.  Therapy: Physical therapy as prescribed. Your therapist will perform an evaluation and treat accordingly.   4.  Intervention: ultrasound guided injection today. Do not submerge injection site in water for 48 hours.  5.  Diagnostics: none today  6.  Follow up: as needed.    If you are signed up for MyChart (a secure internal messaging system), you can message me with questions or concerns. If you need assistance signing up for MyChart, please contact us .    For follow-up appointments, please call the appointment line [phone] and specify at which location you would like to be seen. If you need to fax something, please use [phone].    With questions regarding care, please call Jacqlyn Matas, ATC at [phone].    Future Appointments   Date Time Provider Department Center   07/26/2023  9:20 AM Robie Cho, MD [facility] Ortho Sports   10/20/2023 11:30 AM [facility] Radiolog   11/03/2023 11:00 AM Darl Edu, PA-C [facility] ENT   12/18/2023  3:00 PM Robley Chow, MD [facility] CVM Exam   01/30/2024 11:00 AM Kim, Jolene M, DO [facility] - Primary Care [phone] & Thursday 8-4:30)  [address]  [place], [place] [address]    [facility] - Primary Care (Tuesday 1-4:30)  [address]  [facility] [address] [place], [address]    [facility] Medicine [facility] - Specialist [phone] & Friday 8-4:30)  [address]  [place], [place] [address]    Shaun Deiters, DO, [facility]  Family Medicine & Sports Medicine  [facility] Team Physician  [facility] Team Physician

## 2023-07-26 ENCOUNTER — Ambulatory Visit: Admit: 2023-07-26 | Discharge: 2023-07-27 | Payer: MEDICARE

## 2023-07-26 ENCOUNTER — Encounter: Admit: 2023-07-26 | Discharge: 2023-07-26 | Payer: MEDICARE

## 2023-08-10 ENCOUNTER — Encounter: Admit: 2023-08-10 | Discharge: 2023-08-10 | Payer: MEDICARE

## 2023-08-10 DIAGNOSIS — Z9889 Other specified postprocedural states: Secondary | ICD-10-CM

## 2023-08-16 ENCOUNTER — Encounter: Admit: 2023-08-16 | Discharge: 2023-08-16 | Payer: MEDICARE

## 2023-08-16 ENCOUNTER — Ambulatory Visit: Admit: 2023-08-16 | Discharge: 2023-08-16 | Payer: MEDICARE

## 2023-08-24 ENCOUNTER — Encounter: Admit: 2023-08-24 | Discharge: 2023-08-24 | Payer: MEDICARE

## 2023-08-24 NOTE — Progress Notes
 Rec'd fax for order for PT from [facility] for POC. Signed and faxed. Placed to be scanned into chart.

## 2023-09-28 ENCOUNTER — Encounter: Admit: 2023-09-28 | Discharge: 2023-09-28 | Payer: MEDICARE

## 2023-09-28 NOTE — Progress Notes
 Rec'd fax from [facility] with progress note dated 09/25/2023 for visit. No signature required. Placed to be scanned into chart.

## 2023-10-03 ENCOUNTER — Encounter: Admit: 2023-10-03 | Discharge: 2023-10-03 | Payer: MEDICARE

## 2023-10-03 DIAGNOSIS — S52501D Unspecified fracture of the lower end of right radius, subsequent encounter for closed fracture with routine healing: Secondary | ICD-10-CM

## 2023-10-03 DIAGNOSIS — Z9889 Other specified postprocedural states: Principal | ICD-10-CM

## 2023-10-11 ENCOUNTER — Ambulatory Visit: Admit: 2023-10-11 | Discharge: 2023-10-11 | Payer: MEDICARE

## 2023-10-11 ENCOUNTER — Encounter: Admit: 2023-10-11 | Discharge: 2023-10-11 | Payer: MEDICARE

## 2023-10-17 ENCOUNTER — Encounter: Admit: 2023-10-17 | Discharge: 2023-10-17 | Payer: MEDICARE

## 2023-10-20 ENCOUNTER — Encounter: Admit: 2023-10-20 | Discharge: 2023-10-20 | Payer: MEDICARE

## 2023-10-20 ENCOUNTER — Ambulatory Visit: Admit: 2023-10-20 | Discharge: 2023-10-20 | Payer: MEDICARE

## 2023-10-23 ENCOUNTER — Encounter: Admit: 2023-10-23 | Discharge: 2023-10-23 | Payer: MEDICARE

## 2023-10-23 ENCOUNTER — Ambulatory Visit: Admit: 2023-10-23 | Discharge: 2023-10-23 | Payer: MEDICARE

## 2023-10-23 DIAGNOSIS — M1611 Unilateral primary osteoarthritis, right hip: Principal | ICD-10-CM

## 2023-10-24 ENCOUNTER — Encounter: Admit: 2023-10-24 | Discharge: 2023-10-24 | Payer: MEDICARE

## 2023-10-24 NOTE — Progress Notes
 Rec'd fax from [facility] with end of care summary. No signature required. Placed to be scanned into chart.

## 2023-10-25 ENCOUNTER — Encounter: Admit: 2023-10-25 | Discharge: 2023-10-25 | Payer: MEDICARE

## 2023-10-26 ENCOUNTER — Encounter: Admit: 2023-10-26 | Discharge: 2023-10-26 | Payer: MEDICARE

## 2023-10-26 MED FILL — PRESURGERY KIT B: ORAL | 1 days supply | Fill #0 | Status: CN

## 2023-11-02 ENCOUNTER — Encounter: Admit: 2023-11-02 | Discharge: 2023-11-02 | Payer: MEDICARE

## 2023-11-03 ENCOUNTER — Encounter: Admit: 2023-11-03 | Discharge: 2023-11-03 | Payer: MEDICARE

## 2023-11-03 ENCOUNTER — Ambulatory Visit: Admit: 2023-11-03 | Discharge: 2023-11-04 | Payer: MEDICARE

## 2023-11-06 ENCOUNTER — Encounter: Admit: 2023-11-06 | Discharge: 2023-11-06 | Payer: MEDICARE

## 2023-11-06 NOTE — Progress Notes
 Rec'd fax for order from [facility] for PT. Date of visit 11/03/2023. Faxed signed POC order. Placed to be scanned into chart.

## 2023-11-29 ENCOUNTER — Encounter: Admit: 2023-11-29 | Discharge: 2023-11-29 | Payer: MEDICARE

## 2023-11-29 NOTE — Telephone Encounter
 I spoke with Rachael Black who had called stating she has not been feeling well for 2 days and wonders if she should be seen.    Rachael Black states she was at physical therapy yesterday and had been laying flat for an exercise.  Whenever she sat up she got very dizzy and lightheaded.  It subsided enough to where she could walk safely but it hasn't truly gone away since then.  This morning patient states her symptoms are better than they were yesterday but she still wouldn't trust herself to walk without a walker right now.    Patient states she also feels tingly all over and is more short of breath on exertion.  However, patient states she is in a lot of back and hip pain right now so when she walks she holds her breath because of the pain so when she reaches her destination, she's short of breath because she's been holding her breath.    Her BP this morning before meds was 148/86.  She is not sure what her pulse is but her BP has not been giving her any arrhythmia errors or anything.  She has not taken her meds yet today.    Patient also states she has lower extremity edema if she sits too long.  It does subside if she elevates her legs.    Finally, patient states she is scheduled for hip replacement surgery on 12/24 . She is in a lot of back pain and has not been nearly as active as normal.  I explained this might be why she is having more ankle swelling than normal.    I asked if Rachael Black if she has been drinking enough water recently - she states she has not been drinking nearly enough.  Patient states it hurts to walk to the bathroom so she has not been drinking very much water.    Advised patient to drink plenty of water with her morning meds and that I would call her at noon to check on her.  Patient confirmed understanding.

## 2023-11-29 NOTE — Telephone Encounter
 I spoke with Rachael Black and she stated she is feels mildly better - not quite as dizzy as this morning but still dizzy.  She states she does not get more dizzy when she stands up from sitting - it's about the same either way.  Patient states she does not want to turn her head either way - it gets worse if she does that.    Patient took her morning meds just recently and has not rechecked her BP yet.    Advised patient to call PCP and/or go to urgent care to get checked out as this does not sound heart related, but I reassured her that I would send a note to Dr. Carlyon to make sure he does not feel differently.

## 2023-11-29 NOTE — Telephone Encounter
 Carlyon Bernardino RAMAN, MD to Me  (Selected Message)        11/29/23 12:50 PM  Agree, symptoms sound more likely to be vestibular.  -Continue to check blood pressure and report back if any corresponding hypotension.  Me to Carlyon Bernardino RAMAN, MD   [facility]      11/29/23 12:28 PM  Just making sure you agree with my recommendations.

## 2023-11-30 ENCOUNTER — Encounter: Admit: 2023-11-30 | Discharge: 2023-11-30 | Payer: MEDICARE

## 2023-11-30 ENCOUNTER — Ambulatory Visit: Admit: 2023-11-30 | Discharge: 2023-12-01 | Payer: MEDICARE

## 2023-12-05 ENCOUNTER — Encounter: Admit: 2023-12-05 | Discharge: 2023-12-05 | Payer: MEDICARE

## 2023-12-05 NOTE — Progress Notes
 Rec'd fax from [facility] with end of care. No signature required. Placed to be scanned into chart.

## 2023-12-11 ENCOUNTER — Encounter: Admit: 2023-12-11 | Discharge: 2023-12-11 | Payer: MEDICARE

## 2023-12-18 ENCOUNTER — Ambulatory Visit: Admit: 2023-12-18 | Discharge: 2023-12-19 | Payer: MEDICARE

## 2023-12-18 ENCOUNTER — Encounter: Admit: 2023-12-18 | Discharge: 2023-12-18 | Payer: MEDICARE

## 2023-12-19 ENCOUNTER — Encounter: Admit: 2023-12-19 | Discharge: 2023-12-19 | Payer: MEDICARE

## 2023-12-19 ENCOUNTER — Ambulatory Visit: Admit: 2023-12-19 | Discharge: 2023-12-19 | Payer: MEDICARE

## 2023-12-20 ENCOUNTER — Encounter: Admit: 2023-12-20 | Discharge: 2023-12-20 | Payer: MEDICARE

## 2023-12-21 ENCOUNTER — Encounter: Admit: 2023-12-21 | Discharge: 2023-12-21 | Payer: MEDICARE

## 2023-12-21 NOTE — Telephone Encounter
 Called patient to give instructions for upcoming stress test.     - NO CAFFINE 24 hours prior to appointment - No coffee, decaffeinated   drinks, tea, sodas, energy drinks, or any foods/beverages CONTAINING   CHOCOLATE.    - NOTHING TO EAT OR DRINK AFTER MIDNIGHT - NO BREAKFAST   MORNING OF THEIR TEST    -HOLD ALL OVER-THE-COUNTER MEDICATIONS, VITAMINS, MINERALS,   AND SUPPLEMENTS.    Hold day of :   HCTZ   Sennosides-docusate     Patient verbalized understanding and all questions where answered.

## 2023-12-25 ENCOUNTER — Ambulatory Visit: Admit: 2023-12-25 | Discharge: 2023-12-25 | Payer: MEDICARE

## 2023-12-25 ENCOUNTER — Encounter: Admit: 2023-12-25 | Discharge: 2023-12-25 | Payer: MEDICARE

## 2023-12-26 ENCOUNTER — Encounter: Admit: 2023-12-26 | Discharge: 2023-12-26 | Payer: MEDICARE

## 2024-01-02 ENCOUNTER — Ambulatory Visit: Admit: 2024-01-02 | Discharge: 2024-01-03 | Payer: MEDICARE

## 2024-01-02 ENCOUNTER — Encounter: Admit: 2024-01-02 | Discharge: 2024-01-02 | Payer: MEDICARE

## 2024-01-02 VITALS — BP 119/77 | HR 84 | Temp 97.70000°F | Resp 18 | Ht 65.0 in

## 2024-01-02 DIAGNOSIS — J22 Unspecified acute lower respiratory infection: Principal | ICD-10-CM

## 2024-01-02 MED ORDER — BENZONATATE 100 MG PO CAP
100 mg | ORAL_CAPSULE | ORAL | 0 refills | 9.00000 days | Status: AC
Start: 2024-01-02 — End: ?

## 2024-01-02 MED ORDER — DOXYCYCLINE HYCLATE 100 MG PO TAB
100 mg | ORAL_TABLET | Freq: Two times a day (BID) | ORAL | 0 refills | 8.00000 days | Status: AC
Start: 2024-01-02 — End: ?

## 2024-01-02 MED ORDER — ALBUTEROL SULFATE 90 MCG/ACTUATION IN HFAA
2 | RESPIRATORY_TRACT | 0 refills | 25.00000 days | Status: AC | PRN
Start: 2024-01-02 — End: ?

## 2024-01-02 NOTE — Patient Instructions [37]
 Take 100 mg doxycycline with food morning and evening for 7 days.  Take 100 mg benzonatate every 8 hours as needed for cough.  Use 2 puffs albuterol  every 4 to 6 hours as needed for persistent cough.  Follow up with Dr. Luke if your symptoms do not improve.

## 2024-01-02 NOTE — Progress Notes [1]
 Date of Service: 01/02/2024    Rachael Black is a 78 y.o. female.  DOB: [birth year]  MRN: [id number]     Subjective:             History of Present Illness  Chief Complaint   Patient presents with    Cough     Chief Complaint: Productive Cough- yellow    Time Frame: X8 days     Additional Symptoms: Fatigue, lack of appetite     Prior Treatment/Medications: Cough medication, decongestant, Mucinex      Patient presents with husband and reports she has not felt well for 8 days.  She reports she coughs and coughs.  She denies wheezing but her husband says she has been short of breath.  She also reports some chest tightness.  She states she is coughing up thick yellow phlegm.  She denies fever and asthma.         Review of Systems   Constitutional:  Negative for fever.   HENT:  Positive for congestion.    Respiratory:  Positive for cough, chest tightness and shortness of breath. Negative for wheezing.          Objective:          acetaminophen  SR(+) (TYLENOL ) 650 mg tablet Take two tablets by mouth twice daily. three tablets in the morning and three at night as needed for pain    albuterol  sulfate (PROAIR  HFA) 90 mcg/actuation HFA aerosol inhaler Inhale two puffs by mouth into the lungs every 6 hours as needed.    alendronate  (FOSAMAX ) 70 mg tablet Take one tablet by mouth every 7 days. Take at least 30 minutes before breakfast with plain water. Do not lie down for 30 minutes. (Patient taking differently: Take one tablet by mouth every 7 days. Take at least 30 minutes before breakfast with plain water. Do not lie down for 30 minutes. On Wednesday (Thursday))    aspirin  81 mg chewable tablet Chew one tablet by mouth every 12 hours. Indications: dvt ppx    benzonatate (TESSALON PERLES) 100 mg capsule Take one capsule by mouth every 8 hours.    doxycycline hyclate (VIBRACIN) 100 mg tablet Take one tablet by mouth twice daily for 7 days.    ergocalciferol  (vitamin D2) (VITAMIN D  PO) Take  by mouth.    ferrous sulfate  325 mg (65 mg iron ) tablet Take one tablet by mouth daily.    gabapentin  (NEURONTIN ) 300 mg capsule Take 3 caps twice a day.  Indications: neuropathic pain    hydroCHLOROthiazide (HYDRODIURIL) 25 mg tablet Take one tablet by mouth every morning.    lidocaine  (LIDODERM ) 5 % topical patch Apply one patch topically to affected area daily. Apply patch for 12 hours, then remove for 12 hours before repeating. (Patient taking differently: Apply one patch topically to affected area as Needed. Apply patch for 12 hours, then remove for 12 hours before repeating.)    Miscellaneous Medical Supply misc RSV vaccination (AREXVY)    MULTIVITAMIN PO Take 1 tablet by mouth daily.    pantoprazole  DR (PROTONIX ) 20 mg tablet Take one tablet by mouth daily.    PRESURGERY KIT B Use as directed.  Indications: ERAS pre-surgical preparation    rosuvastatin  (CRESTOR ) 10 mg tablet Take one tablet by mouth at bedtime daily.    telmisartan  (MICARDIS ) 20 mg tablet Take one tablet by mouth twice daily.     Vitals:    01/02/24 1225   BP: 119/77   BP Source: Arm,  Right Upper   Pulse: 84   Temp: 97.7 ?F (36.5 ?C)   Resp: 18   SpO2: 95%   TempSrc: Oral   Weight: Comment: PT. REFUSED   Height: 165.1 cm (5' 5)     Body mass index is 32.95 kg/m?SABRA     Physical Exam  Constitutional:       General: She is awake. She is not in acute distress.     Appearance: She is not ill-appearing, toxic-appearing or diaphoretic.      Comments: Patient is sitting in a wheel chair.  She appears to be fatigued.   HENT:      Head: Normocephalic.      Right Ear: Hearing, tympanic membrane, ear canal and external ear normal.      Left Ear: Hearing, tympanic membrane, ear canal and external ear normal.      Nose:      Right Sinus: No maxillary sinus tenderness or frontal sinus tenderness.      Left Sinus: No maxillary sinus tenderness or frontal sinus tenderness.      Mouth/Throat:      Mouth: Mucous membranes are dry.      Pharynx: No pharyngeal swelling, oropharyngeal exudate or posterior oropharyngeal erythema.      Comments: I encouraged patient to drink 6 to 8 cups of water daily.  She says she has been trying to drink water yet.  Cardiovascular:      Rate and Rhythm: Normal rate and regular rhythm.      Heart sounds: Normal heart sounds.   Pulmonary:      Effort: Pulmonary effort is normal.      Breath sounds: Normal breath sounds and air entry. No decreased breath sounds, wheezing, rhonchi or rales.   Lymphadenopathy:      Head:      Right side of head: Tonsillar adenopathy present. No preauricular, posterior auricular or occipital adenopathy.      Left side of head: Tonsillar adenopathy present. No preauricular, posterior auricular or occipital adenopathy.      Cervical: No cervical adenopathy.      Upper Body:      Right upper body: No supraclavicular adenopathy.      Left upper body: No supraclavicular adenopathy.   Neurological:      Mental Status: She is alert and oriented to person, place, and time.   Psychiatric:         Mood and Affect: Mood normal.         Behavior: Behavior normal. Behavior is cooperative.         Thought Content: Thought content normal.         Judgment: Judgment normal.              Assessment and Plan:  Rachael Black. Rachael Black was seen today for cough.    Diagnoses and all orders for this visit:    Acute lower respiratory infection    Other orders  -     doxycycline hyclate (VIBRACIN) 100 mg tablet; Take one tablet by mouth twice daily for 7 days.  -     benzonatate (TESSALON PERLES) 100 mg capsule; Take one capsule by mouth every 8 hours.  -     albuterol  sulfate (PROAIR  HFA) 90 mcg/actuation HFA aerosol inhaler; Inhale two puffs by mouth into the lungs every 6 hours as needed.  Patient Instructions   Take 100 mg doxycycline with food morning and evening for 7 days.  Take 100 mg benzonatate every 8 hours as needed for cough.  Use 2 puffs albuterol  every 4 to 6 hours as needed for persistent cough.  Follow up with Dr. Luke if your symptoms do not improve.

## 2024-01-10 ENCOUNTER — Encounter: Admit: 2024-01-10 | Discharge: 2024-01-10 | Payer: MEDICARE

## 2024-01-10 DIAGNOSIS — M1611 Unilateral primary osteoarthritis, right hip: Principal | ICD-10-CM

## 2024-01-12 ENCOUNTER — Encounter: Admit: 2024-01-12 | Discharge: 2024-01-12 | Payer: MEDICARE

## 2024-01-12 ENCOUNTER — Ambulatory Visit: Admit: 2024-01-12 | Discharge: 2024-01-12 | Payer: MEDICARE

## 2024-01-12 VITALS — Ht 65.0 in | Wt 198.0 lb

## 2024-01-12 DIAGNOSIS — M1611 Unilateral primary osteoarthritis, right hip: Principal | ICD-10-CM

## 2024-01-12 NOTE — Progress Notes [1]
 Orthopaedic Surgery Follow Up Visit - Rachael Kiang, Rachael Black    Referring Provider: Jolene M Black    Date of Visit: 01/12/2024       CHIEF COMPLAINT: Preoperative visit for RIGHT total hip arthroplasty    HISTORY OF PRESENT ILLNESS: Rachael Black is a 78 y.o. female who returns to clinic to discuss their upcoming hip surgery.  They continue to have severe discomfort in their hip which has been refractory to nonoperative management including activity modification, over-the-counter medications, therapy, and/or injections.  The pain continues to severely affect their ability to perform their ADLs and live a high quality of life.           PHYSICAL EXAM:  There were no vitals filed for this visit.    General: Patient is alert and awake.  No acute distress.    Musculoskeletal: Exam of RIGHT Lower Extremities  - No open lesions, no erythema, no prior incision over the hip  - TTP over the greater trochanter   - RIGHT Hip ROM: 100 degrees of flexion, 10 degrees of IR, 20 degrees of ER, 30 degrees of abduction, 20 degrees of adduction  - Pain with IR and ER  - Stinchfield: positive        - Straight leg raise: neg    - Strength:  Normal strength bilaterally with hip flexion, knee extension, knee flexion. Fires TA, EHL, FHL, GSC  - Feet warm and well perfused.  - palpable pedal pulses     Radiology:  X-rays of the RIGHT hip and pelvis were once again independently interpreted and reviewed today which demonstrate joint line narrowing with bone-on-bone contact. There is marginal osteophyte formation, subchondral sclerosis, and subluxation. There are no acute fractures identified.        _______________________________________________    ASSESSMENT:   RIGHT Hip osteoarthritis    PLAN:   Rachael Black, her husband, and I had a long discussion regarding their upcoming hip surgery. Risks, benefits, and potential complications of the variety of treatment options including infection, neurovascular injury, stiffness, wear and longevity of the implant, revision, loosening, fracture, clot (DVT, PE), fat embolism, bleeding, transfusion, anesthesia, MI, stroke, and death were discussed once again. We also discussed limb length discrepancy after hip replacement. The patient understands that stability of the hip will not be sacrificed in an attempt to equalize leg lengths.    Surgical Planning:    Procedure: RIGHT Total hip arthroplasty.    Approach: Posterior     Implants: Depuy Pinnacle Cup, Actis Stem    DVT prophylaxis: ASA 81mg  BID.    Antibiotics: Perioperative Ancef  x24 hours.     Pain control:   Periarticular multimodal pain injection  Tylenol  1 g every 8 hours  Celebrex 200 mg twice daily  Oxycodone  5-10 mg every 4 hours as needed    Pertinent allergies: Sulfa    Pertinent comorbid conditions: HTN, HLD, MS, Breast Cancer 2017,     Pertinent lab work:   Hgb pending    Clearances reviewed:  PCP - pending  Cardiology  Dental    Inpatient consults planned: None    DME: Patient will require a FWW for safe mobilization following surgery. Patient will require the use of a walker with wheels to complete ADLs in the home including meal preparation, ambulation to the bathroom for toileting, bathing and grooming, and safe home mobility.  Patient will be unable to complete these ADLs with a cane or crutch and will be able to safely use the walker.  Discharge planning: Home with husband and outpatient PT scheduled at [facility] in [facility], [place]  Orthopedic Surgery      ________________________________________________

## 2024-01-18 ENCOUNTER — Encounter: Admit: 2024-01-18 | Discharge: 2024-01-18 | Payer: MEDICARE

## 2024-01-18 NOTE — Telephone Encounter [36]
 Rec'd call from Severa, pharmacist with pre op clinic.  The surgeon is needing Rachael Black to hold her baby aspirin  for 7 days prior to her upcoming right total hip arthroplasty and is asking for Dr. Nanine approval.    Recent stress test normal/low risk.  Patient has a hx of coronary atherosclerosis managed with aspirin  and rosuvastatin .

## 2024-01-18 NOTE — Telephone Encounter [36]
 Carlyon Bernardino RAMAN, MD to Me (Selected Message)      01/18/24  3:42 PM  Yes, we  discussed risk/benefits of being off it temporarily for surgery to minimize bleeding risks  Me to Carlyon Bernardino RAMAN, MD  [facility]      01/18/24  1:25 PM  Ok to hold baby aspirin  for 7 days as requested?

## 2024-01-22 ENCOUNTER — Encounter: Admit: 2024-01-22 | Discharge: 2024-01-22 | Payer: MEDICARE

## 2024-01-23 ENCOUNTER — Encounter: Admit: 2024-01-23 | Discharge: 2024-01-23 | Payer: MEDICARE

## 2024-01-23 MED FILL — PRESURGERY KIT B: ORAL | 1 days supply | Qty: 1 | Fill #1 | Status: AC

## 2024-01-30 ENCOUNTER — Ambulatory Visit: Admit: 2024-01-30 | Discharge: 2024-01-30 | Payer: MEDICARE

## 2024-01-30 ENCOUNTER — Encounter: Admit: 2024-01-30 | Discharge: 2024-01-30 | Payer: MEDICARE

## 2024-01-31 ENCOUNTER — Encounter: Admit: 2024-01-31 | Discharge: 2024-01-31 | Payer: MEDICARE

## 2024-01-31 ENCOUNTER — Ambulatory Visit: Admit: 2024-01-31 | Discharge: 2024-01-31 | Payer: MEDICARE

## 2024-02-06 ENCOUNTER — Encounter: Admit: 2024-02-06 | Discharge: 2024-02-06 | Payer: MEDICARE

## 2024-02-06 ENCOUNTER — Ambulatory Visit: Admit: 2024-02-06 | Discharge: 2024-02-07 | Payer: MEDICARE

## 2024-02-20 ENCOUNTER — Encounter: Admit: 2024-02-20 | Discharge: 2024-02-20 | Payer: MEDICARE

## 2024-02-21 ENCOUNTER — Encounter: Admit: 2024-02-21 | Discharge: 2024-02-21 | Payer: MEDICARE

## 2024-02-23 ENCOUNTER — Encounter: Admit: 2024-02-23 | Discharge: 2024-02-23 | Payer: MEDICARE

## 2024-03-08 ENCOUNTER — Encounter: Admit: 2024-03-08 | Discharge: 2024-03-08 | Payer: MEDICARE

## 2024-03-11 ENCOUNTER — Encounter: Admit: 2024-03-11 | Discharge: 2024-03-11 | Payer: MEDICARE

## 2024-03-22 ENCOUNTER — Encounter: Admit: 2024-03-22 | Discharge: 2024-03-22 | Payer: MEDICARE

## 2024-03-22 DIAGNOSIS — Z96641 Presence of right artificial hip joint: Principal | ICD-10-CM
